# Patient Record
Sex: Male | Born: 1935 | Race: White | Hispanic: No | Smoking: Former smoker
Health system: Southern US, Community
[De-identification: ages and names within clinical notes are randomized; demographics above are authoritative.]

## PROBLEM LIST (undated history)

## (undated) DIAGNOSIS — R42 Dizziness and giddiness: Secondary | ICD-10-CM

## (undated) DIAGNOSIS — Z9621 Cochlear implant status: Secondary | ICD-10-CM

## (undated) DIAGNOSIS — H3530 Unspecified macular degeneration: Secondary | ICD-10-CM

## (undated) DIAGNOSIS — D333 Benign neoplasm of cranial nerves: Secondary | ICD-10-CM

## (undated) DIAGNOSIS — M1990 Unspecified osteoarthritis, unspecified site: Secondary | ICD-10-CM

## (undated) DIAGNOSIS — Z860100 Personal history of colon polyps, unspecified: Secondary | ICD-10-CM

## (undated) DIAGNOSIS — T8859XA Other complications of anesthesia, initial encounter: Secondary | ICD-10-CM

## (undated) DIAGNOSIS — I00 Rheumatic fever without heart involvement: Secondary | ICD-10-CM

## (undated) DIAGNOSIS — A389 Scarlet fever, uncomplicated: Secondary | ICD-10-CM

## (undated) DIAGNOSIS — T4145XA Adverse effect of unspecified anesthetic, initial encounter: Secondary | ICD-10-CM

## (undated) DIAGNOSIS — E7800 Pure hypercholesterolemia, unspecified: Secondary | ICD-10-CM

## (undated) DIAGNOSIS — L409 Psoriasis, unspecified: Secondary | ICD-10-CM

## (undated) DIAGNOSIS — L719 Rosacea, unspecified: Secondary | ICD-10-CM

## (undated) DIAGNOSIS — Z9889 Other specified postprocedural states: Secondary | ICD-10-CM

## (undated) DIAGNOSIS — Z972 Presence of dental prosthetic device (complete) (partial): Secondary | ICD-10-CM

## (undated) HISTORY — DX: Psoriasis, unspecified: L40.9

## (undated) HISTORY — DX: Rheumatic fever without heart involvement: I00

## (undated) HISTORY — PX: COCHLEAR IMPLANT: SUR684

## (undated) HISTORY — PX: BUNIONECTOMY: SHX129

## (undated) HISTORY — DX: Personal history of colonic polyps: Z86.010

## (undated) HISTORY — PX: OTHER SURGICAL HISTORY: SHX169

## (undated) HISTORY — DX: Unspecified macular degeneration: H35.30

## (undated) HISTORY — DX: Personal history of colon polyps, unspecified: Z86.0100

## (undated) HISTORY — DX: Benign neoplasm of cranial nerves: D33.3

## (undated) HISTORY — DX: Rosacea, unspecified: L71.9

## (undated) HISTORY — DX: Pure hypercholesterolemia, unspecified: E78.00

## (undated) HISTORY — DX: Unspecified osteoarthritis, unspecified site: M19.90

## (undated) HISTORY — DX: Other specified postprocedural states: Z98.890

## (undated) HISTORY — DX: Scarlet fever, uncomplicated: A38.9

## (undated) HISTORY — PX: BRAIN SURGERY: SHX531

---
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
## 1957-10-05 HISTORY — PX: APPENDECTOMY: SHX54

## 1992-10-05 HISTORY — PX: CRANIECTOMY FOR EXCISION OF ACOUSTIC NEUROMA: SUR324

## 2002-11-30 ENCOUNTER — Inpatient Hospital Stay (HOSPITAL_COMMUNITY): Admission: EM | Admit: 2002-11-30 | Discharge: 2002-12-02 | Payer: Self-pay | Admitting: *Deleted

## 2002-12-01 ENCOUNTER — Encounter: Payer: Self-pay | Admitting: Internal Medicine

## 2002-12-22 ENCOUNTER — Encounter: Admission: RE | Admit: 2002-12-22 | Discharge: 2002-12-22 | Payer: Self-pay | Admitting: Internal Medicine

## 2002-12-30 ENCOUNTER — Emergency Department (HOSPITAL_COMMUNITY): Admission: EM | Admit: 2002-12-30 | Discharge: 2002-12-30 | Payer: Self-pay | Admitting: Emergency Medicine

## 2003-02-20 ENCOUNTER — Encounter: Admission: RE | Admit: 2003-02-20 | Discharge: 2003-02-20 | Payer: Self-pay | Admitting: Internal Medicine

## 2005-06-23 ENCOUNTER — Ambulatory Visit: Payer: Self-pay | Admitting: Gastroenterology

## 2007-05-31 ENCOUNTER — Ambulatory Visit: Payer: Self-pay

## 2009-05-28 ENCOUNTER — Ambulatory Visit: Payer: Self-pay | Admitting: Internal Medicine

## 2011-07-06 ENCOUNTER — Ambulatory Visit: Payer: Self-pay | Admitting: Internal Medicine

## 2011-10-28 LAB — HM COLONOSCOPY

## 2012-02-09 ENCOUNTER — Ambulatory Visit: Payer: Self-pay | Admitting: Internal Medicine

## 2012-02-19 LAB — LIPID PANEL
Cholesterol: 210 mg/dL — AB (ref 0–200)
LDL Cholesterol: 137 mg/dL
Triglycerides: 188 mg/dL — AB (ref 40–160)

## 2012-02-19 LAB — CBC AND DIFFERENTIAL
HCT: 47 % (ref 41–53)
Hemoglobin: 15.7 g/dL (ref 13.5–17.5)
Platelets: 154 10*3/uL (ref 150–399)
WBC: 4.8 10^3/mL

## 2012-08-15 ENCOUNTER — Ambulatory Visit (INDEPENDENT_AMBULATORY_CARE_PROVIDER_SITE_OTHER): Payer: 59 | Admitting: Internal Medicine

## 2012-08-15 DIAGNOSIS — Z23 Encounter for immunization: Secondary | ICD-10-CM

## 2012-10-26 ENCOUNTER — Encounter: Payer: Self-pay | Admitting: *Deleted

## 2012-10-27 ENCOUNTER — Encounter: Payer: Self-pay | Admitting: Internal Medicine

## 2012-10-27 ENCOUNTER — Ambulatory Visit (INDEPENDENT_AMBULATORY_CARE_PROVIDER_SITE_OTHER): Payer: 59 | Admitting: Internal Medicine

## 2012-10-27 VITALS — BP 110/78 | HR 71 | Temp 98.0°F | Ht 68.0 in | Wt 190.0 lb

## 2012-10-27 DIAGNOSIS — R5383 Other fatigue: Secondary | ICD-10-CM

## 2012-10-27 DIAGNOSIS — E7800 Pure hypercholesterolemia, unspecified: Secondary | ICD-10-CM

## 2012-10-27 DIAGNOSIS — R5381 Other malaise: Secondary | ICD-10-CM

## 2012-10-27 DIAGNOSIS — Z125 Encounter for screening for malignant neoplasm of prostate: Secondary | ICD-10-CM

## 2012-10-27 DIAGNOSIS — H9190 Unspecified hearing loss, unspecified ear: Secondary | ICD-10-CM

## 2012-10-27 DIAGNOSIS — D320 Benign neoplasm of cerebral meninges: Secondary | ICD-10-CM

## 2012-10-27 DIAGNOSIS — D329 Benign neoplasm of meninges, unspecified: Secondary | ICD-10-CM

## 2012-10-27 LAB — CBC WITH DIFFERENTIAL/PLATELET
Basophils Absolute: 0 10*3/uL (ref 0.0–0.1)
Basophils Relative: 0.4 % (ref 0.0–3.0)
Eosinophils Absolute: 0.1 10*3/uL (ref 0.0–0.7)
Eosinophils Relative: 2.4 % (ref 0.0–5.0)
HCT: 48.9 % (ref 39.0–52.0)
Hemoglobin: 16.5 g/dL (ref 13.0–17.0)
Lymphocytes Relative: 24.8 % (ref 12.0–46.0)
Lymphs Abs: 1.3 10*3/uL (ref 0.7–4.0)
MCHC: 33.8 g/dL (ref 30.0–36.0)
MCV: 90.4 fl (ref 78.0–100.0)
Monocytes Absolute: 0.4 10*3/uL (ref 0.1–1.0)
Monocytes Relative: 7.1 % (ref 3.0–12.0)
Neutro Abs: 3.4 10*3/uL (ref 1.4–7.7)
Neutrophils Relative %: 65.3 % (ref 43.0–77.0)
Platelets: 157 10*3/uL (ref 150.0–400.0)
RBC: 5.41 Mil/uL (ref 4.22–5.81)
WBC: 5.2 10*3/uL (ref 4.5–10.5)

## 2012-10-27 LAB — LDL CHOLESTEROL, DIRECT: Direct LDL: 157.2 mg/dL

## 2012-10-27 LAB — COMPREHENSIVE METABOLIC PANEL
ALT: 33 U/L (ref 0–53)
AST: 25 U/L (ref 0–37)
Albumin: 4.2 g/dL (ref 3.5–5.2)
CO2: 28 mEq/L (ref 19–32)
Calcium: 9.5 mg/dL (ref 8.4–10.5)
Chloride: 105 mEq/L (ref 96–112)
GFR: 87.37 mL/min (ref >60.00)
Potassium: 4.3 mEq/L (ref 3.5–5.1)

## 2012-10-27 LAB — LIPID PANEL: Total CHOL/HDL Ratio: 7

## 2012-10-27 LAB — PSA, MEDICARE: PSA: 0.4 ng/ml (ref 0.10–4.00)

## 2012-10-28 ENCOUNTER — Telehealth: Payer: Self-pay | Admitting: Internal Medicine

## 2012-10-28 ENCOUNTER — Ambulatory Visit: Payer: 59

## 2012-10-28 LAB — BILIRUBIN, DIRECT: Bilirubin, Direct: 0.1 mg/dL (ref 0.0–0.3)

## 2012-10-28 NOTE — Telephone Encounter (Signed)
Can you add direct bili to blood drawn yesterday.  Dx hyperbilirubinemia

## 2012-10-30 ENCOUNTER — Encounter: Payer: Self-pay | Admitting: Internal Medicine

## 2012-10-30 DIAGNOSIS — Z86018 Personal history of other benign neoplasm: Secondary | ICD-10-CM | POA: Insufficient documentation

## 2012-10-30 DIAGNOSIS — H9190 Unspecified hearing loss, unspecified ear: Secondary | ICD-10-CM | POA: Insufficient documentation

## 2012-10-30 DIAGNOSIS — E7800 Pure hypercholesterolemia, unspecified: Secondary | ICD-10-CM | POA: Insufficient documentation

## 2012-10-30 NOTE — Progress Notes (Signed)
  Subjective:    [name], male    DOB: Oct 25, 1934, 78 y.o.   MRN: [id number]  HPI 78 year old male with past history of hypercholesterolemia, cochlear implant (after suffering acute hearing loss) and is s/p removal of a meningioma.  He comes in today to follow up on these issues as well as for a complete physical exam.  He states he is doing well.  No chest pain or tightness.  Breathing stable.  Eating and drinking well.  Working part time at [employer].  Bowels stable.  Urinating without difficulty.    Past Medical History  Diagnosis Date  . Hypercholesterolemia   . History of colonic polyps   . Acoustic neuroma   . Rosacea   . Rheumatic fever age 35  . Scarlet fever age 52  . History of craniotomy   . Psoriasis   . Macular degeneration     Current Outpatient Prescriptions on File Prior to Visit  Medication Sig Dispense Refill  . aspirin EC 81 MG tablet Take 81 mg by mouth daily.      . fluticasone (FLONASE) 50 MCG/ACT nasal spray Place 2 sprays into the nose daily.      . Multiple Vitamins-Minerals (VISION-VITE PRESERVE PO) Take by mouth.        Review of Systems Patient denies any headache, lightheadedness or dizziness. No sinus or allergy symptoms.  No chest pain, tightness or palpitations.  No increased shortness of breath, cough or congestion.  No nausea or vomiting.  No abdominal pain or cramping.  No bowel change, such as diarrhea, constipation, BRBPR or melana.  No urine change.   Handling stress.       Objective:   Physical Exam Filed Vitals:   10/27/12 1029  BP: 110/78  Pulse: 71  Temp: 98 F (46.64 C)   78 year old male in no acute distress.  HEENT:  Nares - clear.  Oropharynx - without lesions. NECK:  Supple.  Nontender.  No audible carotid bruit.  HEART:  Appears to be regular.   LUNGS:  No crackles or wheezing audible.  Respirations even and unlabored.   RADIAL PULSE:  Equal bilaterally.  ABDOMEN:  Soft.  Nontender.  Bowel sounds  present and normal.  No audible abdominal bruit.  GU:  Normal descended testicles.  No palpable testicular nodules.   RECTAL:  Could not appreciate any palpable prostate nodules.  Heme negative.   EXTREMITIES:  No increased edema present.  DP pulses palpable and equal bilaterally.          Assessment & Plan:  CARDIOVASCULAR.  ECHO 12/06 - mild to moderate mitral regurgitation, tricuspid regurgitation, mild left atrial enlargement, EF 55-60%.  Has declined follow up ECHO.  Asymptomatic.   GI.  Colonoscopy 07/23/11 revealed diverticulosis, one 6mm polyp in the transverse colon and seven 2-4 mm polyps in the sigmoid colon and external hemorrhoids.  Recommend follow up colonoscopy 3-5 years.    HEALTH MAINTENANCE.  Physical today.  Check psa.  GI as outlined.  Check cholesterol.

## 2012-10-30 NOTE — Assessment & Plan Note (Signed)
Low cholesterol diet and exercise.  Check lipid profile.   

## 2012-10-30 NOTE — Assessment & Plan Note (Signed)
S/p removal.  Continues to follow up with neurosurgery.   

## 2012-10-30 NOTE — Assessment & Plan Note (Signed)
S/P cochlear implant.  Follow.    

## 2012-10-31 ENCOUNTER — Other Ambulatory Visit: Payer: Self-pay | Admitting: Internal Medicine

## 2012-10-31 NOTE — Progress Notes (Signed)
Follow up liver panel ordered.

## 2012-11-28 ENCOUNTER — Encounter: Payer: Self-pay | Admitting: Internal Medicine

## 2012-11-28 ENCOUNTER — Ambulatory Visit (INDEPENDENT_AMBULATORY_CARE_PROVIDER_SITE_OTHER): Payer: 59 | Admitting: Internal Medicine

## 2012-11-28 ENCOUNTER — Other Ambulatory Visit (INDEPENDENT_AMBULATORY_CARE_PROVIDER_SITE_OTHER): Payer: 59

## 2012-11-28 ENCOUNTER — Telehealth: Payer: Self-pay | Admitting: Internal Medicine

## 2012-11-28 VITALS — BP 124/80 | HR 74 | Temp 98.8°F | Ht 68.0 in | Wt 181.0 lb

## 2012-11-28 DIAGNOSIS — R17 Unspecified jaundice: Secondary | ICD-10-CM

## 2012-11-28 DIAGNOSIS — E7800 Pure hypercholesterolemia, unspecified: Secondary | ICD-10-CM

## 2012-11-28 LAB — HEPATIC FUNCTION PANEL: Total Bilirubin: 1 mg/dL (ref 0.3–1.2)

## 2012-11-28 MED ORDER — AZITHROMYCIN 250 MG PO TABS: ORAL_TABLET | ORAL | Status: DC

## 2012-11-28 NOTE — Telephone Encounter (Signed)
Pt notified of labs via my chart.  

## 2012-12-01 ENCOUNTER — Encounter: Payer: Self-pay | Admitting: Internal Medicine

## 2012-12-01 NOTE — Progress Notes (Signed)
  Subjective:    Patient ID: BAIN WHICHARD, male    DOB: 12-19-1934, 78 y.o.   MRN: [id number]  HPI 78 year old male with past history of hypercholesterolemia, cochlear implant (after suffering acute hearing loss) and is s/p removal of a meningioma.  He comes in today as a work in with concerns regarding increased cough and congestion.  He states that symptoms started two weeks ago.  Previously had a minimal headache.  No headache now.  No sore throat.  Cough - productive of colored sputum.  No blood.  No sob.  No chest pain or tightness.  Breathing stable.  Eating and drinking well.  No fever.  No vomiting or diarrhea.    Past Medical History  Diagnosis Date  . Hypercholesterolemia   . History of colonic polyps   . Acoustic neuroma   . Rosacea   . Rheumatic fever age 74  . Scarlet fever age 57  . History of craniotomy   . Psoriasis   . Macular degeneration     Current Outpatient Prescriptions on File Prior to Visit  Medication Sig Dispense Refill  . aspirin EC 81 MG tablet Take 81 mg by mouth daily.      . fluticasone (FLONASE) 50 MCG/ACT nasal spray Place 2 sprays into the nose daily.      . Multiple Vitamins-Minerals (VISION-VITE PRESERVE PO) Take by mouth.       No current facility-administered medications on file prior to visit.    Review of Systems Patient denies any headache now.  No lightheadedness or dizziness. No significant sinus pressure.  Some minimal drainage.   No chest pain, tightness or palpitations.  No increased shortness of breath.  Does report the cough and congestion.   No nausea or vomiting.  Eating and drinking well.       Objective:   Physical Exam  Filed Vitals:   11/28/12 1146  BP: 124/80  Pulse: 74  Temp: 98.8 F (37.1 C)   Pulse ox 36%  78 year old male in no acute distress.  HEENT:  Nares - clear.  Oropharynx - without lesions.  No tenderness to palpation over the sinus.  NECK:  Supple.  Nontender.  No audible carotid bruit.  HEART:   Appears to be regular.   LUNGS:  No crackles or wheezing audible.  Respirations even and unlabored.   RADIAL PULSE:  Equal bilaterally.         Assessment & Plan:  URI.  Treat with a Zpak as directed.  Flonase nasal spray and saline nasal flushes as directed.  Robitussin DM as directed.  Rest.  Fluids.  Explained to him if symptoms changed, worsened or did not resolve, he was to be reevaluated.    CARDIOVASCULAR.  ECHO 12/06 - mild to moderate mitral regurgitation, tricuspid regurgitation, mild left atrial enlargement, EF 55-60%.  Has declined follow up ECHO.  Asymptomatic.   GI.  Colonoscopy 07/23/11 revealed diverticulosis, one 6mm polyp in the transverse colon and seven 2-4 mm polyps in the sigmoid colon and external hemorrhoids.  Recommend follow up colonoscopy 3-5 years.    HEALTH MAINTENANCE.  Physical last visit.   GI as outlined.

## 2012-12-01 NOTE — Assessment & Plan Note (Signed)
He informed me he was working on diet and exercise and has lost weight.  Follow cholesterol.

## 2013-03-10 DIAGNOSIS — H3530 Unspecified macular degeneration: Secondary | ICD-10-CM | POA: Insufficient documentation

## 2013-04-27 ENCOUNTER — Ambulatory Visit: Payer: 59 | Admitting: Internal Medicine

## 2013-05-10 ENCOUNTER — Other Ambulatory Visit: Payer: Self-pay

## 2013-08-10 ENCOUNTER — Other Ambulatory Visit: Payer: Self-pay

## 2013-08-25 ENCOUNTER — Ambulatory Visit (INDEPENDENT_AMBULATORY_CARE_PROVIDER_SITE_OTHER): Payer: Medicare Other

## 2013-08-25 DIAGNOSIS — Z23 Encounter for immunization: Secondary | ICD-10-CM

## 2013-10-23 ENCOUNTER — Encounter: Payer: Self-pay | Admitting: Internal Medicine

## 2013-10-24 ENCOUNTER — Ambulatory Visit (INDEPENDENT_AMBULATORY_CARE_PROVIDER_SITE_OTHER): Payer: 59 | Admitting: Internal Medicine

## 2013-10-24 ENCOUNTER — Encounter: Payer: Self-pay | Admitting: Internal Medicine

## 2013-10-24 VITALS — BP 106/66 | HR 90 | Temp 98.4°F | Resp 12 | Ht 68.0 in | Wt 189.5 lb

## 2013-10-24 DIAGNOSIS — R109 Unspecified abdominal pain: Secondary | ICD-10-CM

## 2013-10-24 DIAGNOSIS — R55 Syncope and collapse: Secondary | ICD-10-CM

## 2013-10-24 LAB — HEPATIC FUNCTION PANEL
ALK PHOS: 92 U/L (ref 39–117)
ALT: 32 U/L (ref 0–53)
AST: 25 U/L (ref 0–37)
Albumin: 4 g/dL (ref 3.5–5.2)
BILIRUBIN TOTAL: 1.9 mg/dL — AB (ref 0.3–1.2)
Bilirubin, Direct: 0.3 mg/dL (ref 0.0–0.3)
Total Protein: 7 g/dL (ref 6.0–8.3)

## 2013-10-24 LAB — CBC WITH DIFFERENTIAL/PLATELET
BASOS ABS: 0 10*3/uL (ref 0.0–0.1)
Basophils Relative: 0.2 % (ref 0.0–3.0)
Eosinophils Absolute: 0.2 10*3/uL (ref 0.0–0.7)
Eosinophils Relative: 1.8 % (ref 0.0–5.0)
HCT: 49.4 % (ref 39.0–52.0)
HEMOGLOBIN: 16.7 g/dL (ref 13.0–17.0)
LYMPHS PCT: 11 % — AB (ref 12.0–46.0)
Lymphs Abs: 1.1 10*3/uL (ref 0.7–4.0)
MCHC: 33.9 g/dL (ref 30.0–36.0)
MCV: 90.7 fl (ref 78.0–100.0)
Monocytes Absolute: 0.6 10*3/uL (ref 0.1–1.0)
Monocytes Relative: 6.7 % (ref 3.0–12.0)
NEUTROS ABS: 7.7 10*3/uL (ref 1.4–7.7)
Neutrophils Relative %: 80.3 % — ABNORMAL HIGH (ref 43.0–77.0)
PLATELETS: 151 10*3/uL (ref 150.0–400.0)
RBC: 5.45 Mil/uL (ref 4.22–5.81)
RDW: 13.6 % (ref 11.5–14.6)
WBC: 9.6 10*3/uL (ref 4.5–10.5)

## 2013-10-24 LAB — URINALYSIS, ROUTINE W REFLEX MICROSCOPIC
Bilirubin Urine: NEGATIVE
HGB URINE DIPSTICK: NEGATIVE
KETONES UR: NEGATIVE
Leukocytes, UA: NEGATIVE
Nitrite: NEGATIVE
Specific Gravity, Urine: >=1.03 — AB (ref 1.000–1.030)
Total Protein, Urine: NEGATIVE
URINE GLUCOSE: NEGATIVE
Urobilinogen, UA: 0.2 (ref 0.0–1.0)
pH: 5.5 (ref 5.0–8.0)

## 2013-10-24 LAB — BASIC METABOLIC PANEL
BUN: 13 mg/dL (ref 6–23)
CALCIUM: 9.2 mg/dL (ref 8.4–10.5)
CO2: 28 meq/L (ref 19–32)
CREATININE: 1.1 mg/dL (ref 0.4–1.5)
Chloride: 101 mEq/L (ref 96–112)
GFR: 72.14 mL/min (ref >60.00)
GLUCOSE: 107 mg/dL — AB (ref 70–99)
Potassium: 4.4 mEq/L (ref 3.5–5.1)
Sodium: 138 mEq/L (ref 135–145)

## 2013-10-24 LAB — LIPASE: LIPASE: 70 U/L — AB (ref 11.0–59.0)

## 2013-10-24 LAB — AMYLASE: Amylase: 71 U/L (ref 27–131)

## 2013-10-24 MED ORDER — AZITHROMYCIN 250 MG PO TABS: ORAL_TABLET | ORAL | Status: DC

## 2013-10-24 MED ORDER — FLUTICASONE PROPIONATE 50 MCG/ACT NA SUSP: 2.0000 | Freq: Every day | NASAL | Status: DC

## 2013-10-24 NOTE — Progress Notes (Signed)
Pre visit review using our clinic review tool, if applicable. No additional management support is needed unless otherwise documented below in the visit note. 

## 2013-10-24 NOTE — Patient Instructions (Signed)
Saline nasal spray - flush nose at least 2-3x/day.  Flonase nasal spray - 2 sprays each nostril one time per day- do this in the evening.

## 2013-10-25 ENCOUNTER — Ambulatory Visit: Payer: Self-pay | Admitting: Internal Medicine

## 2013-10-26 NOTE — Telephone Encounter (Signed)
Mailed unread message to pt  

## 2013-10-28 ENCOUNTER — Encounter: Payer: Self-pay | Admitting: Internal Medicine

## 2013-10-28 NOTE — Progress Notes (Signed)
Subjective:    [name], male    DOB: April 12, 1935, 79 y.o.   MRN: [id number]  Abdominal Pain  79 year old male with past history of hypercholesterolemia, cochlear implant (after suffering acute hearing loss) and is s/p removal of a meningioma.  He comes in today as a work in with concerns regarding increased abdominal pain.  States that three weeks ago had some increased sinus pressure and drainage.  Reports still having increased cough and chest congestion.  No sore throat.  Green mucus production.  Felt dizzy last week.  Took meclizine.  This has resolved.  Reports some decreased appetite.  Staying hydrated.  States that two days ago, noticed some heartburn.  Took pepcid AC.  He then noticed pain under his ribs.  Felt bloated.  Hurt to touch.  Pain went to his back.  The bad episode lasted approximately 6 hours.  Some nausea.  No vomiting.  Described the pain as a burning pain.  Could taste what he had eaten.  No urine change.     Past Medical History  Diagnosis Date  . Hypercholesterolemia   . History of colonic polyps   . Acoustic neuroma   . Rosacea   . Rheumatic fever age 61  . Scarlet fever age 61  . History of craniotomy   . Psoriasis   . Macular degeneration     Current Outpatient Prescriptions on File Prior to Visit  Medication Sig Dispense Refill  . aspirin EC 81 MG tablet Take 81 mg by mouth daily.      . Multiple Vitamins-Minerals (VISION-VITE PRESERVE PO) Take by mouth.       No current facility-administered medications on file prior to visit.    Review of Systems  Gastrointestinal: Positive for abdominal pain.  Patient denies any headache now.  Minimal headache yesterday.  No lightheadedness or dizziness. No significant sinus pressure now.   Some drainage.   No chest pain, tightness or palpitations.  No increased shortness of breath.  Does report the cough and chest congestion.   Green mucus production.  Decreased appetite.  Some nausea.  No vomiting.   Increased pain under ribs and radiating to back.  Some burning pain.  See above.       Objective:   Physical Exam  Filed Vitals:   10/24/13 1149  BP: 106/66  Pulse: 90  Temp: 98.4 F (36.9 C)  Resp: 12   Blood pressure recheck:  122/78 lying and 122/80 standing.   79 year old male in no acute distress.  HEENT:  Nares - clear.  Oropharynx - without lesions.  No tenderness to palpation over the sinus.  NECK:  Supple.  Nontender.   HEART:  Appears to be regular.   LUNGS:  No crackles or wheezing audible.  Respirations even and unlabored.   RADIAL PULSE:  Equal bilaterally.   ABDOMEN:  Minimal tenderness to palpation over the epigastric region.  No other abdominal pain to palpation.  Bowel sounds present and normal.   BACK:  Non tender.  No CVA tenderness.         Assessment & Plan:  URI.  Treat with a Zpak as directed.  Flonase nasal spray and saline nasal flushes as directed.  Robitussin DM as directed.  Rest.  Fluids.  Explained to him if symptoms changed, worsened or did not resolve, he was to be reevaluated.    CARDIOVASCULAR.  ECHO 12/06 - mild to moderate mitral regurgitation, tricuspid regurgitation, mild left  atrial enlargement, EF 55-60%.  He has declined follow up ECHO.   EKG today revealed sinus rhythm with no acute ischemic changes.    GI.  Colonoscopy 07/23/11 revealed diverticulosis, one 48mm polyp in the transverse colon and seven 2-4 mm polyps in the sigmoid colon and external hemorrhoids.  Recommend follow up colonoscopy 3-5 years.    HEALTH MAINTENANCE.  Physical 10/27/12.   GI as outlined.   I spent more than 25 minutes with the patient and more than 50% of the time was spent in consultation regarding the above.

## 2013-10-29 DIAGNOSIS — R109 Unspecified abdominal pain: Secondary | ICD-10-CM | POA: Insufficient documentation

## 2013-10-29 NOTE — Assessment & Plan Note (Signed)
Symptoms and exam as outlined.  Check cbc, met b, liver panel and amylase/lipase.  Check abdominal ultrasound.  Take zantac daily.  Follow closely.  Explained to him if symptoms changed, worsened or did not resolve, he was to be reevaluated.

## 2013-10-30 ENCOUNTER — Encounter: Payer: Self-pay | Admitting: *Deleted

## 2013-10-31 ENCOUNTER — Encounter: Payer: Self-pay | Admitting: Internal Medicine

## 2013-10-31 ENCOUNTER — Ambulatory Visit (INDEPENDENT_AMBULATORY_CARE_PROVIDER_SITE_OTHER): Payer: 59 | Admitting: Internal Medicine

## 2013-10-31 VITALS — BP 110/70 | HR 78 | Temp 97.9°F | Ht 68.0 in | Wt 187.5 lb

## 2013-10-31 DIAGNOSIS — R748 Abnormal levels of other serum enzymes: Secondary | ICD-10-CM

## 2013-10-31 DIAGNOSIS — R109 Unspecified abdominal pain: Secondary | ICD-10-CM

## 2013-10-31 LAB — LIPASE: LIPASE: 69 U/L — AB (ref 11.0–59.0)

## 2013-10-31 LAB — AMYLASE: Amylase: 86 U/L (ref 27–131)

## 2013-10-31 NOTE — Progress Notes (Signed)
Pre-visit discussion using our clinic review tool. No additional management support is needed unless otherwise documented below in the visit note.  

## 2013-10-31 NOTE — Progress Notes (Signed)
Subjective:    [name], male    DOB: May 21, 1935, 79 y.o.   MRN: [id number]  Abdominal Pain  79 year old male with past history of hypercholesterolemia, cochlear implant (after suffering acute hearing loss) and is s/p removal of a meningioma.  He comes in today for a scheduled follow up.  Was seen last week for abdominal pain.  See that note for details.  Had labs and abdominal ultrasound.  Ultrasound revealed no gallstones, but possible sludge in the gallblader.  No other acute abnormality.  He comes in today stating he feels better.  He is accompanied by his daughter.  History obtained from both of them.  The previous abdominal pain is better.  He does report some lower abdominal pressure.  Some nausea.  No diarrhea.  Some constipation with decreased bowel movements.  Has not been eating regular meals.  Previous decreased appetite.  Appetite has improved recently.  Some acid occasionally.  Still with some lower abdominal discomfort.  No other abdominal pain.  Has noticed some BRB on tissue with wiping.  Previous light headed sensation resolved.  Congestion resolved.      Past Medical History  Diagnosis Date  . Hypercholesterolemia   . History of colonic polyps   . Acoustic neuroma   . Rosacea   . Rheumatic fever age 57  . Scarlet fever age 42  . History of craniotomy   . Psoriasis   . Macular degeneration     Current Outpatient Prescriptions on File Prior to Visit  Medication Sig Dispense Refill  . aspirin EC 81 MG tablet Take 81 mg by mouth daily.      . fluticasone (FLONASE) 50 MCG/ACT nasal spray Place 2 sprays into both nostrils daily.  16 g  1  . Multiple Vitamins-Minerals (VISION-VITE PRESERVE PO) Take by mouth.       No current facility-administered medications on file prior to visit.    Review of Systems  Gastrointestinal: Positive for abdominal pain.  Patient denies any headache.   No lightheadedness or dizziness. No significant sinus pressure now.  No  chest pain, tightness or palpitations.  No increased shortness of breath.  No cough or chest congestion.   Appetite has improved some.   Some nausea.  No vomiting.  The previous abdominal pain has resolved.  Some lower abdominal pressure.  Bowel change as outlined.  Abdominal ultrasound as outlined.        Objective:   Physical Exam  Filed Vitals:   10/31/13 0856  BP: 110/70  Pulse: 78  Temp: 97.9 F (36.6 C)   Blood pressure recheck:  75/40   79 year old male in no acute distress.  HEENT:  Nares - clear.  Oropharynx - without lesions.  No tenderness to palpation over the sinus.  NECK:  Supple.  Nontender.   HEART:  Appears to be regular.   LUNGS:  No crackles or wheezing audible.  Respirations even and unlabored.   RADIAL PULSE:  Equal bilaterally.   ABDOMEN:  No pain to palpation over the epigastric region.  No other significant abdominal pain to palpation.  Minimal lower abdominal discomfort.  Bowel sounds present and normal.   BACK:  Non tender.  No CVA tenderness.         Assessment & Plan:  URI.  Resolved.     CARDIOVASCULAR.  ECHO 12/06 - mild to moderate mitral regurgitation, tricuspid regurgitation, mild left atrial enlargement, EF 55-60%.  He has declined follow up  ECHO.   EKG today revealed sinus rhythm with no acute ischemic changes.    GI.  Colonoscopy 07/23/11 revealed diverticulosis, one 29mm polyp in the transverse colon and seven 2-4 mm polyps in the sigmoid colon and external hemorrhoids.  Recommend follow up colonoscopy 3-5 years.  Discussed with him today.  Discussed referral back to GI for evaluation especially given bowel change and BRB.  He declines at this time.  Wants to pursue HIDA first.  Will notify me when he changes his mind.    HEALTH MAINTENANCE.  Physical 10/27/12.   GI as outlined.   I spent more than 25 minutes with the patient and more than 50% of the time was spent in consultation regarding the above.

## 2013-11-01 ENCOUNTER — Encounter: Payer: Self-pay | Admitting: Internal Medicine

## 2013-11-02 ENCOUNTER — Encounter: Payer: Self-pay | Admitting: Internal Medicine

## 2013-11-02 NOTE — Assessment & Plan Note (Signed)
Previous abdominal pain and resolved.  Now with lower abdominal discomfort.  Abdominal ultrasound results as outlined.  Recent labs normal except for slightly elevated lipase.  Will recheck today.  Discussed ultrasound results.  Will obtain HIDA scan.  He declines GI referral at this time.  Increase fluid intake.  miralax as outlined.  Feel the bowel change may be related to his change and decreased appetite/diet.  Will follow.  Further w/up pending results of HIDA scan.

## 2013-11-08 ENCOUNTER — Ambulatory Visit: Payer: Self-pay | Admitting: Internal Medicine

## 2013-11-10 ENCOUNTER — Telehealth: Payer: Self-pay | Admitting: Internal Medicine

## 2013-11-10 NOTE — Telephone Encounter (Signed)
Sent my chart message to pt with HIDA scan results.  He is to update me regarding his symptoms.

## 2013-11-15 ENCOUNTER — Encounter: Payer: Self-pay | Admitting: Internal Medicine

## 2013-12-07 ENCOUNTER — Encounter: Payer: Self-pay | Admitting: Internal Medicine

## 2013-12-07 ENCOUNTER — Ambulatory Visit (INDEPENDENT_AMBULATORY_CARE_PROVIDER_SITE_OTHER): Payer: Medicare Other | Admitting: Internal Medicine

## 2013-12-07 DIAGNOSIS — E7800 Pure hypercholesterolemia, unspecified: Secondary | ICD-10-CM

## 2013-12-07 DIAGNOSIS — D320 Benign neoplasm of cerebral meninges: Secondary | ICD-10-CM

## 2013-12-07 DIAGNOSIS — R195 Other fecal abnormalities: Secondary | ICD-10-CM

## 2013-12-07 DIAGNOSIS — D329 Benign neoplasm of meninges, unspecified: Secondary | ICD-10-CM

## 2013-12-07 DIAGNOSIS — Z1211 Encounter for screening for malignant neoplasm of colon: Secondary | ICD-10-CM

## 2013-12-07 DIAGNOSIS — Z125 Encounter for screening for malignant neoplasm of prostate: Secondary | ICD-10-CM

## 2013-12-07 DIAGNOSIS — R17 Unspecified jaundice: Secondary | ICD-10-CM

## 2013-12-07 DIAGNOSIS — Z23 Encounter for immunization: Secondary | ICD-10-CM

## 2013-12-07 DIAGNOSIS — L989 Disorder of the skin and subcutaneous tissue, unspecified: Secondary | ICD-10-CM

## 2013-12-07 DIAGNOSIS — R109 Unspecified abdominal pain: Secondary | ICD-10-CM

## 2013-12-07 DIAGNOSIS — H9190 Unspecified hearing loss, unspecified ear: Secondary | ICD-10-CM

## 2013-12-07 NOTE — Progress Notes (Signed)
Pre-visit discussion using our clinic review tool. No additional management support is needed unless otherwise documented below in the visit note.  

## 2013-12-10 ENCOUNTER — Encounter: Payer: Self-pay | Admitting: Internal Medicine

## 2013-12-10 NOTE — Assessment & Plan Note (Signed)
Low cholesterol diet and exercise.  Follow cholesterol.   

## 2013-12-10 NOTE — Assessment & Plan Note (Signed)
S/P cochlear implant.  Follow.

## 2013-12-10 NOTE — Addendum Note (Signed)
Addended by: Alisa Graff on: 12/10/2013 06:48 PM   Modules accepted: Orders

## 2013-12-10 NOTE — Assessment & Plan Note (Signed)
S/p removal.  Continues to follow up with neurosurgery.   

## 2013-12-10 NOTE — Assessment & Plan Note (Signed)
Previous abdominal pain resolved.  Abdominal ultrasound results as outlined in last note.  HIDA negative.  No pain now.  Heme positive on exam today.  Refer to GI for evaluation and colonoscopy.

## 2013-12-10 NOTE — Progress Notes (Signed)
  Subjective:    [name], male    DOB: 1935/05/11, 79 y.o.   MRN: [id number]  HPI 79 year old male with past history of hypercholesterolemia, cochlear implant (after suffering acute hearing loss) and is s/p removal of a meningioma.  He comes in today to follow up on these issues as well as for a complete physical exam.  He states he feels good.  No abdominal pain or cramping.  No nausea or vomiting.  No acid reflux.  No urine or bowel change. Eating and drinking well.      Past Medical History  Diagnosis Date  . Hypercholesterolemia   . History of colonic polyps   . Acoustic neuroma   . Rosacea   . Rheumatic fever age 68  . Scarlet fever age 23  . History of craniotomy   . Psoriasis   . Macular degeneration     Current Outpatient Prescriptions on File Prior to Visit  Medication Sig Dispense Refill  . aspirin EC 81 MG tablet Take 81 mg by mouth daily.      . fluticasone (FLONASE) 50 MCG/ACT nasal spray Place 2 sprays into both nostrils daily.  16 g  1  . Multiple Vitamins-Minerals (VISION-VITE PRESERVE PO) Take by mouth.       No current facility-administered medications on file prior to visit.    Review of Systems Patient denies any headache.   No lightheadedness or dizziness. No significant sinus pressure.   No chest pain, tightness or palpitations.  No increased shortness of breath.  No cough or congestion.  No nausea or vomiting.  No acid reflux.  Eating and drinking well.  No abdominal pain or cramping.  No bowel change.  Feels better.  No further flares.       Objective:   Physical Exam  Filed Vitals:   12/07/13 1546  BP: 130/80  Pulse: 69  Temp: 98.1 F (36.7 C)   Blood pressure recheck:  44/35  79 year old male in no acute distress.  HEENT:  Nares - clear.  Oropharynx - without lesions. NECK:  Supple.  Nontender.  No audible carotid bruit.  HEART:  Appears to be regular.   LUNGS:  No crackles or wheezing audible.  Respirations even and  unlabored.   RADIAL PULSE:  Equal bilaterally.  ABDOMEN:  Soft.  Nontender.  Bowel sounds present and normal.  No audible abdominal bruit.  GU:  Normal descended testicles.  No palpable testicular nodules.   RECTAL:  Could not appreciate any palpable prostate nodules.  Heme positive.    EXTREMITIES:  No increased edema present.  DP pulses palpable and equal bilaterally.        Assessment & Plan:  CARDIOVASCULAR.  ECHO 12/06 - mild to moderate mitral regurgitation, tricuspid regurgitation, mild left atrial enlargement, EF 55-60%.  Has declined follow up ECHO.  Asymptomatic.   GI.  Colonoscopy 07/23/11 revealed diverticulosis, one 23mm polyp in the transverse colon and seven 2-4 mm polyps in the sigmoid colon and external hemorrhoids.  Recommend follow up colonoscopy 3-5 years.  Recent abdominal pain.  Resolved.  See previous notes for details.  Abdominal US and HIDA negative.  Heme positive on exam today.  Refer to GI for colonoscopy and further evaluation.    HEALTH MAINTENANCE.  Physical today.   GI as outlined.  Check psa.

## 2013-12-14 ENCOUNTER — Other Ambulatory Visit (INDEPENDENT_AMBULATORY_CARE_PROVIDER_SITE_OTHER): Payer: Medicare Other

## 2013-12-14 DIAGNOSIS — R17 Unspecified jaundice: Secondary | ICD-10-CM

## 2013-12-14 DIAGNOSIS — Z125 Encounter for screening for malignant neoplasm of prostate: Secondary | ICD-10-CM

## 2013-12-14 DIAGNOSIS — E7800 Pure hypercholesterolemia, unspecified: Secondary | ICD-10-CM

## 2013-12-14 LAB — HEPATIC FUNCTION PANEL
ALT: 35 U/L (ref 0–53)
AST: 26 U/L (ref 0–37)
Albumin: 4 g/dL (ref 3.5–5.2)
Alkaline Phosphatase: 80 U/L (ref 39–117)
Bilirubin, Direct: 0.2 mg/dL (ref 0.0–0.3)
Total Bilirubin: 1.2 mg/dL (ref 0.3–1.2)
Total Protein: 6.3 g/dL (ref 6.0–8.3)

## 2013-12-14 LAB — LIPID PANEL
CHOLESTEROL: 227 mg/dL — AB (ref 0–200)
HDL: 36.6 mg/dL — AB (ref >39.00)
LDL Cholesterol: 153 mg/dL — ABNORMAL HIGH (ref 0–99)
Total CHOL/HDL Ratio: 6
Triglycerides: 188 mg/dL — ABNORMAL HIGH (ref 0.0–149.0)
VLDL: 37.6 mg/dL (ref 0.0–40.0)

## 2013-12-14 LAB — PSA, MEDICARE: PSA: 0.3 ng/ml (ref 0.10–4.00)

## 2013-12-15 ENCOUNTER — Encounter: Payer: Self-pay | Admitting: Internal Medicine

## 2013-12-27 ENCOUNTER — Encounter: Payer: Self-pay | Admitting: Internal Medicine

## 2014-03-06 ENCOUNTER — Other Ambulatory Visit: Payer: Self-pay | Admitting: Internal Medicine

## 2014-04-12 ENCOUNTER — Ambulatory Visit (INDEPENDENT_AMBULATORY_CARE_PROVIDER_SITE_OTHER): Payer: Medicare Other | Admitting: Internal Medicine

## 2014-04-12 ENCOUNTER — Encounter: Payer: Self-pay | Admitting: Internal Medicine

## 2014-04-12 VITALS — BP 120/70 | HR 66 | Temp 98.0°F | Ht 67.0 in | Wt 191.0 lb

## 2014-04-12 DIAGNOSIS — R17 Unspecified jaundice: Secondary | ICD-10-CM

## 2014-04-12 DIAGNOSIS — R109 Unspecified abdominal pain: Secondary | ICD-10-CM

## 2014-04-12 DIAGNOSIS — E7800 Pure hypercholesterolemia, unspecified: Secondary | ICD-10-CM

## 2014-04-12 DIAGNOSIS — H9190 Unspecified hearing loss, unspecified ear: Secondary | ICD-10-CM

## 2014-04-12 DIAGNOSIS — D329 Benign neoplasm of meninges, unspecified: Secondary | ICD-10-CM

## 2014-04-12 DIAGNOSIS — D320 Benign neoplasm of cerebral meninges: Secondary | ICD-10-CM

## 2014-04-12 LAB — CBC WITH DIFFERENTIAL/PLATELET
Basophils Absolute: 0 10*3/uL (ref 0.0–0.1)
Basophils Relative: 0.3 % (ref 0.0–3.0)
Eosinophils Absolute: 0.1 10*3/uL (ref 0.0–0.7)
Eosinophils Relative: 3 % (ref 0.0–5.0)
HCT: 47.1 % (ref 39.0–52.0)
Hemoglobin: 16.1 g/dL (ref 13.0–17.0)
Lymphocytes Relative: 27.4 % (ref 12.0–46.0)
Lymphs Abs: 1.2 10*3/uL (ref 0.7–4.0)
MCHC: 34.2 g/dL (ref 30.0–36.0)
MCV: 89.9 fl (ref 78.0–100.0)
MONOS PCT: 7.2 % (ref 3.0–12.0)
Monocytes Absolute: 0.3 10*3/uL (ref 0.1–1.0)
NEUTROS PCT: 62.1 % (ref 43.0–77.0)
Neutro Abs: 2.8 10*3/uL (ref 1.4–7.7)
Platelets: 144 10*3/uL — ABNORMAL LOW (ref 150.0–400.0)
RBC: 5.24 Mil/uL (ref 4.22–5.81)
RDW: 13.8 % (ref 11.5–15.5)
WBC: 4.5 10*3/uL (ref 4.0–10.5)

## 2014-04-12 LAB — BASIC METABOLIC PANEL
BUN: 16 mg/dL (ref 6–23)
CALCIUM: 9.3 mg/dL (ref 8.4–10.5)
CO2: 28 meq/L (ref 19–32)
CREATININE: 0.9 mg/dL (ref 0.4–1.5)
Chloride: 104 mEq/L (ref 96–112)
GFR: 84.85 mL/min (ref >60.00)
Glucose, Bld: 100 mg/dL — ABNORMAL HIGH (ref 70–99)
Potassium: 4.4 mEq/L (ref 3.5–5.1)
Sodium: 138 mEq/L (ref 135–145)

## 2014-04-12 LAB — HEPATIC FUNCTION PANEL
ALT: 33 U/L (ref 0–53)
AST: 26 U/L (ref 0–37)
Albumin: 4.1 g/dL (ref 3.5–5.2)
Alkaline Phosphatase: 96 U/L (ref 39–117)
Bilirubin, Direct: 0.1 mg/dL (ref 0.0–0.3)
Total Bilirubin: 1 mg/dL (ref 0.2–1.2)
Total Protein: 6.7 g/dL (ref 6.0–8.3)

## 2014-04-12 LAB — LIPID PANEL
Cholesterol: 218 mg/dL — ABNORMAL HIGH (ref 0–200)
HDL: 31.2 mg/dL — ABNORMAL LOW (ref >39.00)
LDL Cholesterol: 133 mg/dL — ABNORMAL HIGH (ref 0–99)
NONHDL: 186.8
Total CHOL/HDL Ratio: 7
Triglycerides: 269 mg/dL — ABNORMAL HIGH (ref 0.0–149.0)
VLDL: 53.8 mg/dL — AB (ref 0.0–40.0)

## 2014-04-12 LAB — TSH: TSH: 1.2 u[IU]/mL (ref 0.35–4.50)

## 2014-04-12 NOTE — Progress Notes (Signed)
Pre visit review using our clinic review tool, if applicable. No additional management support is needed unless otherwise documented below in the visit note. 

## 2014-04-16 ENCOUNTER — Encounter: Payer: Self-pay | Admitting: Internal Medicine

## 2014-04-16 NOTE — Assessment & Plan Note (Signed)
S/p removal.  Continues to follow up with neurosurgery.

## 2014-04-16 NOTE — Assessment & Plan Note (Signed)
S/P cochlear implant.  Doing well.  Follow.

## 2014-04-16 NOTE — Assessment & Plan Note (Signed)
Low cholesterol diet and exercise.  Follow cholesterol.   

## 2014-04-16 NOTE — Assessment & Plan Note (Signed)
Previous abdominal pain resolved.  Abdominal ultrasound results as outlined in last note.  HIDA negative.  No pain now.  Heme positive on last exam.  Referred to GI.  Felt no further w/up or evaluation warranted.  Follow.

## 2014-04-16 NOTE — Progress Notes (Signed)
  Subjective:    [name], male    DOB: [birth year], 79 y.o.   MRN: [id number]  HPI 79 year old male with past history of hypercholesterolemia, cochlear implant (after suffering acute hearing loss) and is s/p removal of a meningioma.  He comes in today for a scheduled follow up.   He states he feels good.  No abdominal pain or cramping.  No nausea or vomiting.  No acid reflux.  No urine or bowel change.  Eating and drinking well.  No blood in his stool.  Plays golf.  Tries to stay active.      Past Medical History  Diagnosis Date  . Hypercholesterolemia   . History of colonic polyps   . Acoustic neuroma   . Rosacea   . Rheumatic fever age 37  . Scarlet fever age 5  . History of craniotomy   . Psoriasis   . Macular degeneration     Current Outpatient Prescriptions on File Prior to Visit  Medication Sig Dispense Refill  . aspirin EC 81 MG tablet Take 81 mg by mouth daily.      . fluticasone (FLONASE) 50 MCG/ACT nasal spray USE TWO SPRAYS IN EACH NOSTRIL EACH DAY AS DIRECTED BY PHYSICIAN  16 g  5  . Multiple Vitamins-Minerals (VISION-VITE PRESERVE PO) Take by mouth.       No current facility-administered medications on file prior to visit.    Review of Systems Patient denies any headache.   No lightheadedness or dizziness.  No significant sinus pressure.   No chest pain, tightness or palpitations.  No increased shortness of breath.  No cough or congestion.  No nausea or vomiting.  No acid reflux.  Eating and drinking well.  No abdominal pain or cramping.  No bowel change.  Feels better.  No further flares.       Objective:   Physical Exam  Filed Vitals:   04/12/14 0950  BP: 120/70  Pulse: 66  Temp: 98 F (37.48 C)   79 year old male in no acute distress.  HEENT:  Nares - clear.  Oropharynx - without lesions. NECK:  Supple.  Nontender.  No audible carotid bruit.  HEART:  Appears to be regular.   LUNGS:  No crackles or wheezing audible.  Respirations even and  unlabored.   RADIAL PULSE:  Equal bilaterally.  ABDOMEN:  Soft.  Nontender.  Bowel sounds present and normal.  No audible abdominal bruit.    EXTREMITIES:  No increased edema present.  DP pulses palpable and equal bilaterally.        Assessment & Plan:  CARDIOVASCULAR.  ECHO 12/06 - mild to moderate mitral regurgitation, tricuspid regurgitation, mild left atrial enlargement, EF 55-60%.  Has declined follow up ECHO.  Asymptomatic.   GI.  Colonoscopy 07/23/11 revealed diverticulosis, one 61mm polyp in the transverse colon and seven 2-4 mm polyps in the sigmoid colon and external hemorrhoids.  Recommend follow up colonoscopy 3-5 years.  Recent abdominal pain.  Resolved.  See previous notes for details.  Abdominal US and HIDA negative.  Was referred to GI for heme positive stool.  Evaluated at [facility].  Felt no further w/up warranted.  Follow.      HEALTH MAINTENANCE.  Physical 12/07/13.   GI as outlined.  PSA 12/14/13 - .30.

## 2014-04-19 ENCOUNTER — Encounter: Payer: Self-pay | Admitting: Internal Medicine

## 2014-04-20 ENCOUNTER — Telehealth: Payer: Self-pay | Admitting: Internal Medicine

## 2014-04-20 DIAGNOSIS — D696 Thrombocytopenia, unspecified: Secondary | ICD-10-CM

## 2014-04-20 NOTE — Telephone Encounter (Signed)
lab appt scheduled & [facility] lipid handout mailed

## 2014-04-20 NOTE — Telephone Encounter (Signed)
Pt notified of lab results via my chart.  Needs [facility] diet.  Please send.  Also needs a f/u platelet count within the next 2 weeks.  Please schedule a non fasting lab appt in 2 weeks.  Thanks.

## 2014-05-04 ENCOUNTER — Other Ambulatory Visit: Payer: 59

## 2014-05-08 ENCOUNTER — Other Ambulatory Visit (INDEPENDENT_AMBULATORY_CARE_PROVIDER_SITE_OTHER): Payer: 59

## 2014-05-08 DIAGNOSIS — D696 Thrombocytopenia, unspecified: Secondary | ICD-10-CM

## 2014-05-09 ENCOUNTER — Encounter: Payer: Self-pay | Admitting: Internal Medicine

## 2014-05-09 ENCOUNTER — Telehealth: Payer: Self-pay | Admitting: *Deleted

## 2014-05-09 DIAGNOSIS — D696 Thrombocytopenia, unspecified: Secondary | ICD-10-CM

## 2014-05-09 LAB — PLATELET COUNT: Platelets: 74 10*3/uL — ABNORMAL LOW (ref 150–400)

## 2014-05-09 NOTE — Telephone Encounter (Signed)
Lattie Haw called to inform us that she had Mr. Moronta scheduled for tomorrow @ 10:30, however he has declined an appointment. If pt decides to be seen, he will need to be rescheduled & she will need a copy of his BMP & ultrasound faxed to: [phone]

## 2014-05-09 NOTE — Telephone Encounter (Signed)
I have called pt multiple times today.  Not answering.  I left him a message on his home phone and cell phone to call the office.  If he calls, please explain to him that his platelet count has decreased significantly from the previous check and I do recommend him seeing the hematologist.  If I need to talk with him and explain, find out how I can reach him and when.  Thanks.

## 2014-05-09 NOTE — Telephone Encounter (Signed)
Attempted to call pt.  Not answering.  Will try back.

## 2014-05-10 ENCOUNTER — Other Ambulatory Visit: Payer: Self-pay | Admitting: Internal Medicine

## 2014-05-10 DIAGNOSIS — D696 Thrombocytopenia, unspecified: Secondary | ICD-10-CM

## 2014-05-10 NOTE — Progress Notes (Signed)
Order placed for f/u cbc.   

## 2014-05-10 NOTE — Telephone Encounter (Signed)
My chart message sent to pt and was instructed to repeat platelet count 05/10/14 or 05/11/14.

## 2014-05-10 NOTE — Telephone Encounter (Signed)
Order placed for hematology referral.  

## 2014-05-10 NOTE — Telephone Encounter (Signed)
Pt notified & coming tomorrow to repeat lab

## 2014-05-11 ENCOUNTER — Other Ambulatory Visit (INDEPENDENT_AMBULATORY_CARE_PROVIDER_SITE_OTHER): Payer: 59

## 2014-05-11 ENCOUNTER — Other Ambulatory Visit: Payer: Self-pay | Admitting: Internal Medicine

## 2014-05-11 DIAGNOSIS — D696 Thrombocytopenia, unspecified: Secondary | ICD-10-CM

## 2014-05-11 LAB — CBC WITH DIFFERENTIAL/PLATELET
BASOS ABS: 0 10*3/uL (ref 0.0–0.1)
Basophils Relative: 0.2 % (ref 0.0–3.0)
EOS ABS: 0.1 10*3/uL (ref 0.0–0.7)
Eosinophils Relative: 3.6 % (ref 0.0–5.0)
HEMATOCRIT: 45.8 % (ref 39.0–52.0)
HEMOGLOBIN: 15.2 g/dL (ref 13.0–17.0)
LYMPHS ABS: 1.1 10*3/uL (ref 0.7–4.0)
Lymphocytes Relative: 27.1 % (ref 12.0–46.0)
MCHC: 33.2 g/dL (ref 30.0–36.0)
MCV: 91.6 fl (ref 78.0–100.0)
MONO ABS: 0.2 10*3/uL (ref 0.1–1.0)
Monocytes Relative: 6.1 % (ref 3.0–12.0)
NEUTROS ABS: 2.5 10*3/uL (ref 1.4–7.7)
Neutrophils Relative %: 63 % (ref 43.0–77.0)
Platelets: 132 10*3/uL — ABNORMAL LOW (ref 150.0–400.0)
RBC: 5 Mil/uL (ref 4.22–5.81)
RDW: 14.1 % (ref 11.5–15.5)
WBC: 4 10*3/uL (ref 4.0–10.5)

## 2014-05-11 NOTE — Progress Notes (Signed)
F/u cbc ordered.

## 2014-05-13 ENCOUNTER — Telehealth: Payer: Self-pay | Admitting: Internal Medicine

## 2014-05-13 NOTE — Telephone Encounter (Signed)
Pt wants to come to lab 05/25/14 between 9:00 and 11:00.  Please schedule and notify him of appt time.  Thanks.

## 2014-05-14 ENCOUNTER — Telehealth: Payer: Self-pay

## 2014-05-14 NOTE — Telephone Encounter (Signed)
Ok.  Yes I spoke with him regarding his counts.  I left you a message on Friday that he just wants to follow for now.  Thanks.

## 2014-05-14 NOTE — Telephone Encounter (Signed)
The patient called and wants to cancel his hematology referral.

## 2014-05-25 ENCOUNTER — Other Ambulatory Visit (INDEPENDENT_AMBULATORY_CARE_PROVIDER_SITE_OTHER): Payer: Medicare Other

## 2014-05-25 ENCOUNTER — Other Ambulatory Visit: Payer: Self-pay | Admitting: *Deleted

## 2014-05-25 DIAGNOSIS — D696 Thrombocytopenia, unspecified: Secondary | ICD-10-CM

## 2014-05-26 LAB — CBC WITH DIFFERENTIAL/PLATELET
BASOS ABS: 0 10*3/uL (ref 0.0–0.1)
Basophils Relative: 0.4 % (ref 0.0–3.0)
EOS ABS: 0.1 10*3/uL (ref 0.0–0.7)
Eosinophils Relative: 2.9 % (ref 0.0–5.0)
HCT: 48.8 % (ref 39.0–52.0)
Hemoglobin: 16.1 g/dL (ref 13.0–17.0)
LYMPHS PCT: 22.3 % (ref 12.0–46.0)
Lymphs Abs: 1.1 10*3/uL (ref 0.7–4.0)
MCHC: 33 g/dL (ref 30.0–36.0)
MCV: 93 fl (ref 78.0–100.0)
Monocytes Absolute: 0.3 10*3/uL (ref 0.1–1.0)
Monocytes Relative: 5.4 % (ref 3.0–12.0)
Neutro Abs: 3.3 10*3/uL (ref 1.4–7.7)
Neutrophils Relative %: 69 % (ref 43.0–77.0)
PLATELETS: 144 10*3/uL — AB (ref 150.0–400.0)
RBC: 5.25 Mil/uL (ref 4.22–5.81)
RDW: 14.4 % (ref 11.5–15.5)
WBC: 4.8 10*3/uL (ref 4.0–10.5)

## 2014-05-27 ENCOUNTER — Telehealth: Payer: Self-pay | Admitting: Internal Medicine

## 2014-05-27 ENCOUNTER — Encounter: Payer: Self-pay | Admitting: Internal Medicine

## 2014-05-27 DIAGNOSIS — D696 Thrombocytopenia, unspecified: Secondary | ICD-10-CM

## 2014-05-27 NOTE — Telephone Encounter (Signed)
Pt notified of lab results via my chart.  Needs a non fasting lab appt in 2 months.  Please schedule and contact him with an appt date and time.  Thanks.

## 2014-07-30 ENCOUNTER — Other Ambulatory Visit (INDEPENDENT_AMBULATORY_CARE_PROVIDER_SITE_OTHER): Payer: Medicare Other

## 2014-07-30 DIAGNOSIS — D696 Thrombocytopenia, unspecified: Secondary | ICD-10-CM

## 2014-07-31 ENCOUNTER — Encounter: Payer: Self-pay | Admitting: Internal Medicine

## 2014-07-31 LAB — PLATELET COUNT: PLATELETS: 168 10*3/uL (ref 150–400)

## 2014-10-03 ENCOUNTER — Encounter: Payer: Self-pay | Admitting: *Deleted

## 2014-10-04 ENCOUNTER — Encounter: Payer: Self-pay | Admitting: Internal Medicine

## 2014-10-04 MED ORDER — PIMECROLIMUS 1 % EX CREA: TOPICAL_CREAM | Freq: Two times a day (BID) | CUTANEOUS | Status: DC

## 2014-10-04 NOTE — Telephone Encounter (Signed)
rx sent in for elidel cream.

## 2014-10-10 ENCOUNTER — Ambulatory Visit (INDEPENDENT_AMBULATORY_CARE_PROVIDER_SITE_OTHER): Payer: Medicare Other

## 2014-10-10 ENCOUNTER — Telehealth: Payer: Self-pay | Admitting: *Deleted

## 2014-10-10 ENCOUNTER — Ambulatory Visit: Payer: Medicare Other

## 2014-10-10 DIAGNOSIS — Z23 Encounter for immunization: Secondary | ICD-10-CM

## 2014-10-10 NOTE — Telephone Encounter (Signed)
Fax from [employer], needing PA for Elidel. Started online, printed for Dr. Nicki Reaper to complete and sign.

## 2014-10-11 ENCOUNTER — Telehealth: Payer: Self-pay | Admitting: *Deleted

## 2014-10-11 NOTE — Telephone Encounter (Signed)
Spoke with pt, he states he has not used anything else however is willing to try anything that is less expense and is covered by insurance for Rosacea.  Please advise

## 2014-10-11 NOTE — Telephone Encounter (Signed)
If it is for rosacea, then can try metrogel facial cream - apply topically bid.  Can see if he has tried this and had any problems.  If no, then ok to send in rx.

## 2014-10-12 MED ORDER — METRONIDAZOLE 0.75 % EX GEL: 1.0000 "application " | Freq: Two times a day (BID) | CUTANEOUS | Status: DC

## 2014-10-12 NOTE — Telephone Encounter (Signed)
Noted  

## 2014-10-12 NOTE — Telephone Encounter (Signed)
Pt states he has not used anything else.  He will try the Metrogel Cream.  Rx sent to pharmacy.

## 2014-12-17 ENCOUNTER — Encounter: Payer: Self-pay | Admitting: Internal Medicine

## 2014-12-17 ENCOUNTER — Ambulatory Visit (INDEPENDENT_AMBULATORY_CARE_PROVIDER_SITE_OTHER): Payer: Medicare Other | Admitting: Internal Medicine

## 2014-12-17 VITALS — BP 118/70 | HR 66 | Temp 98.8°F | Ht 67.5 in | Wt 197.1 lb

## 2014-12-17 DIAGNOSIS — E7800 Pure hypercholesterolemia, unspecified: Secondary | ICD-10-CM

## 2014-12-17 DIAGNOSIS — D329 Benign neoplasm of meninges, unspecified: Secondary | ICD-10-CM

## 2014-12-17 DIAGNOSIS — D696 Thrombocytopenia, unspecified: Secondary | ICD-10-CM

## 2014-12-17 DIAGNOSIS — Z0000 Encounter for general adult medical examination without abnormal findings: Secondary | ICD-10-CM

## 2014-12-17 DIAGNOSIS — H9190 Unspecified hearing loss, unspecified ear: Secondary | ICD-10-CM

## 2014-12-17 DIAGNOSIS — Z125 Encounter for screening for malignant neoplasm of prostate: Secondary | ICD-10-CM

## 2014-12-17 LAB — CBC WITH DIFFERENTIAL/PLATELET
BASOS ABS: 0 10*3/uL (ref 0.0–0.1)
Basophils Relative: 0.3 % (ref 0.0–3.0)
Eosinophils Absolute: 0.2 10*3/uL (ref 0.0–0.7)
Eosinophils Relative: 3.3 % (ref 0.0–5.0)
HCT: 44.7 % (ref 39.0–52.0)
Hemoglobin: 15.1 g/dL (ref 13.0–17.0)
LYMPHS PCT: 25 % (ref 12.0–46.0)
Lymphs Abs: 1.7 10*3/uL (ref 0.7–4.0)
MCHC: 33.8 g/dL (ref 30.0–36.0)
MCV: 91.3 fl (ref 78.0–100.0)
Monocytes Absolute: 0.5 10*3/uL (ref 0.1–1.0)
Monocytes Relative: 7.5 % (ref 3.0–12.0)
Neutro Abs: 4.3 10*3/uL (ref 1.4–7.7)
Neutrophils Relative %: 63.9 % (ref 43.0–77.0)
PLATELETS: 245 10*3/uL (ref 150.0–400.0)
RBC: 4.9 Mil/uL (ref 4.22–5.81)
RDW: 13.8 % (ref 11.5–15.5)
WBC: 6.7 10*3/uL (ref 4.0–10.5)

## 2014-12-17 LAB — HEPATIC FUNCTION PANEL
ALT: 35 U/L (ref 0–53)
AST: 25 U/L (ref 0–37)
Albumin: 4.5 g/dL (ref 3.5–5.2)
Alkaline Phosphatase: 94 U/L (ref 39–117)
Bilirubin, Direct: 0.1 mg/dL (ref 0.0–0.3)
TOTAL PROTEIN: 6.7 g/dL (ref 6.0–8.3)
Total Bilirubin: 0.9 mg/dL (ref 0.2–1.2)

## 2014-12-17 LAB — LIPID PANEL
CHOLESTEROL: 243 mg/dL — AB (ref 0–200)
HDL: 35.1 mg/dL — AB (ref >39.00)
NonHDL: 207.9
TRIGLYCERIDES: 320 mg/dL — AB (ref 0.0–149.0)
Total CHOL/HDL Ratio: 7
VLDL: 64 mg/dL — AB (ref 0.0–40.0)

## 2014-12-17 LAB — BASIC METABOLIC PANEL
BUN: 14 mg/dL (ref 6–23)
CALCIUM: 9.4 mg/dL (ref 8.4–10.5)
CO2: 29 mEq/L (ref 19–32)
Chloride: 103 mEq/L (ref 96–112)
Creatinine, Ser: 0.91 mg/dL (ref 0.40–1.50)
GFR: 85.77 mL/min (ref >60.00)
GLUCOSE: 84 mg/dL (ref 70–99)
Potassium: 4.3 mEq/L (ref 3.5–5.1)
SODIUM: 139 meq/L (ref 135–145)

## 2014-12-17 LAB — LDL CHOLESTEROL, DIRECT: Direct LDL: 163 mg/dL

## 2014-12-17 LAB — PSA, MEDICARE: PSA: 0.35 ng/ml (ref 0.10–4.00)

## 2014-12-17 LAB — TSH: TSH: 3.33 u[IU]/mL (ref 0.35–4.50)

## 2014-12-17 NOTE — Assessment & Plan Note (Signed)
Last cbc revealed normal platelet count.  Recheck cbc today.

## 2014-12-17 NOTE — Assessment & Plan Note (Signed)
S/p removal.  Followed by neurosurgery.   

## 2014-12-17 NOTE — Progress Notes (Signed)
[name], male   DOB: 04/20/1935, 80 y.o.   MRN: [id number]   Subjective:    [name], male    DOB: July 11, 1935, 80 y.o.   MRN: [id number]  HPI  Patient here for his physical exam.  States he is doing well.  Feels good. Stays active.  Has been playing golf.  No cardiac symptoms with increased activity or exertion.  Breathing stable.  No nausea or vomiting.  No bowel change.  No abdominal pain. Has been having problems with his eyes.  Increased pressure.  Followed by Dr Thomasene Ripple.  Just had his ears checked out two weeks ago.  Ok.     Past Medical History  Diagnosis Date  . Hypercholesterolemia   . History of colonic polyps   . Acoustic neuroma   . Rosacea   . Rheumatic fever age 54  . Scarlet fever age 69  . History of craniotomy   . Psoriasis   . Macular degeneration     Current Outpatient Prescriptions on File Prior to Visit  Medication Sig Dispense Refill  . aspirin EC 81 MG tablet Take 81 mg by mouth daily.    . fluticasone (FLONASE) 50 MCG/ACT nasal spray USE TWO SPRAYS IN EACH NOSTRIL EACH DAY AS DIRECTED BY PHYSICIAN 16 g 5  . metroNIDAZOLE (METROGEL) 0.75 % gel Apply 1 application topically 2 (two) times daily. 45 g 0  . Multiple Vitamins-Minerals (VISION-VITE PRESERVE PO) Take by mouth.    . pimecrolimus (ELIDEL) 1 % cream Apply topically 2 (two) times daily. 30 g 0   No current facility-administered medications on file prior to visit.    Review of Systems  Constitutional: Negative for appetite change and unexpected weight change.  HENT: Negative for congestion and sinus pressure.        Increased pressure in his eyes.  Seeing opthalmology.  Pressures improving.   Respiratory: Negative for cough, chest tightness and shortness of breath.   Cardiovascular: Negative for chest pain, palpitations and leg swelling.  Gastrointestinal: Negative for nausea, vomiting, abdominal pain and diarrhea.  Genitourinary: Negative for difficulty  urinating.  Skin: Negative for color change and rash.  Neurological: Negative for dizziness, light-headedness and headaches.       Objective:    Physical Exam  Constitutional: He is oriented to person, place, and time. He appears well-developed and well-nourished. No distress.  HENT:  Head: Normocephalic and atraumatic.  Nose: Nose normal.  Mouth/Throat: Oropharynx is clear and moist. No oropharyngeal exudate.  Eyes: Conjunctivae are normal. Right eye exhibits no discharge. Left eye exhibits no discharge.  Neck: Neck supple. No thyromegaly present.  Cardiovascular: Normal rate and regular rhythm.   I/VI systolic murmur  Pulmonary/Chest: Breath sounds normal. No respiratory distress. He has no wheezes.  Abdominal: Soft. Bowel sounds are normal. There is no tenderness.  Genitourinary:  Rectal exam performed - no prostate nodule appreciated.  Heme negative.    Musculoskeletal: He exhibits no edema or tenderness.  Lymphadenopathy:    He has no cervical adenopathy.  Neurological: He is alert and oriented to person, place, and time.  Skin: Skin is warm and dry. No rash noted.  Psychiatric: He has a normal mood and affect. His behavior is normal.    BP 118/70 mmHg  Pulse 66  Temp(Src) 98.8 F (37.1 C) (Oral)  Ht 5' 7.5" (1.715 m)  Wt 197 lb 2 oz (89.415 kg)  BMI 30.40 kg/m2  SpO2 94% Wt Readings from Last 3  Encounters:  12/17/14 197 lb 2 oz (89.415 kg)  04/12/14 191 lb (86.637 kg)  12/07/13 190 lb 12 oz (86.524 kg)     Lab Results  Component Value Date   WBC 4.8 05/25/2014   HGB 16.1 05/25/2014   HCT 48.8 05/25/2014   PLT 168 07/30/2014   GLUCOSE 100* 04/12/2014   CHOL 218* 04/12/2014   TRIG 269.0* 04/12/2014   HDL 31.20* 04/12/2014   LDLDIRECT 157.2 10/27/2012   LDLCALC 133* 04/12/2014   ALT 33 04/12/2014   AST 26 04/12/2014   NA 138 04/12/2014   K 4.4 04/12/2014   CL 104 04/12/2014   CREATININE 0.9 04/12/2014   BUN 16 04/12/2014   CO2 28 04/12/2014   TSH  1.20 04/12/2014   PSA 0.30 12/14/2013       Assessment & Plan:   Problem List Items Addressed This Visit    Health care maintenance    Physical today.  Check psa today.  Colonoscopy 07/23/11.  Recommended f/u colonoscopy in 3-5 years.  States saw GI within the last year.  States told did not need another colonoscopy.  Follow.       Hearing loss    S/p cochlear implant.  Doing well.  Follow.  Just evaluated two weeks ago.        Hypercholesterolemia - Primary    Low cholesterol diet and exercise.  Follow lipid panel.        Relevant Orders   Lipid panel   Hepatic function panel   Basic metabolic panel   Meningioma    S/p removal.  Followed by neurosurgery.        Thrombocytopenia    Last cbc revealed normal platelet count.  Recheck cbc today.        Relevant Orders   CBC with Differential/Platelet   TSH    Other Visit Diagnoses    Prostate cancer screening        Relevant Orders    PSA, Medicare      I spent 25 minutes with the patient and more than 50% of the time was spent in consultation regarding the above.     Benjamin Pheasant, MD

## 2014-12-17 NOTE — Assessment & Plan Note (Signed)
Physical today.  Check psa today.  Colonoscopy 07/23/11.  Recommended f/u colonoscopy in 3-5 years.  States saw GI within the last year.  States told did not need another colonoscopy.  Follow.

## 2014-12-17 NOTE — Progress Notes (Signed)
Pre visit review using our clinic review tool, if applicable. No additional management support is needed unless otherwise documented below in the visit note. 

## 2014-12-17 NOTE — Assessment & Plan Note (Signed)
S/p cochlear implant.  Doing well.  Follow.  Just evaluated two weeks ago.

## 2014-12-17 NOTE — Assessment & Plan Note (Signed)
Low cholesterol diet and exercise.  Follow lipid panel.   

## 2014-12-18 ENCOUNTER — Encounter: Payer: Self-pay | Admitting: Internal Medicine

## 2014-12-26 ENCOUNTER — Encounter: Payer: Self-pay | Admitting: Internal Medicine

## 2015-01-09 ENCOUNTER — Ambulatory Visit
Admit: 2015-01-09 | Disposition: A | Discharge status: Home / Self Care | Payer: Self-pay | Attending: Ophthalmology | Admitting: Ophthalmology

## 2015-01-19 IMAGING — US ABDOMEN ULTRASOUND
1 series · 13 of 25 positions shown · non-contrast
Comparison: Abdominal ultrasound dated July 06, 2011

CLINICAL DATA: Nausea and abdominal pain

EXAM:
ULTRASOUND ABDOMEN COMPLETE

[Series 1: abdomen ultrasound · 0.31mm/px · 13 of 69 slices shown]
[im 1/69]
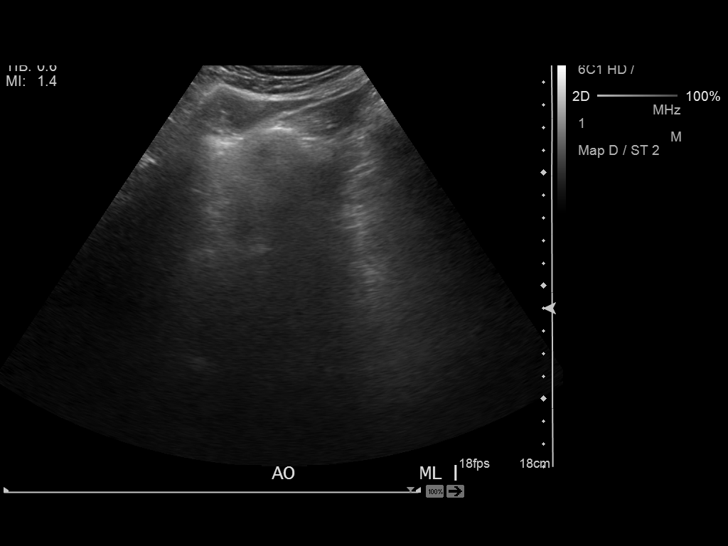
[im 6/69]
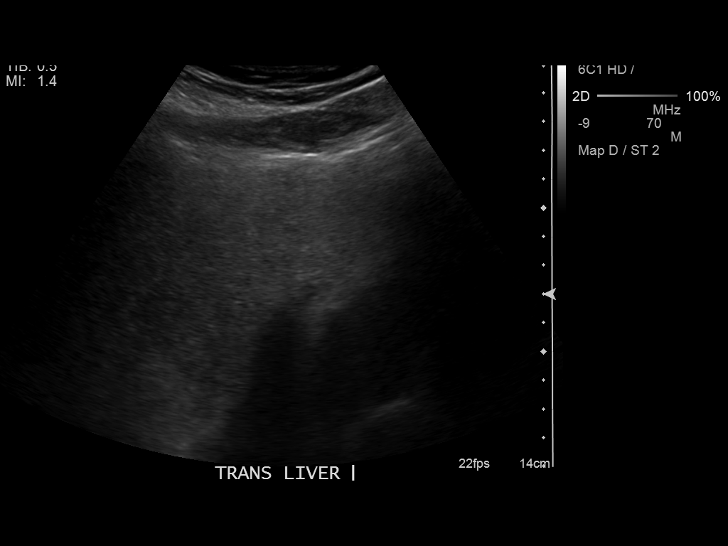
[im 12/69]
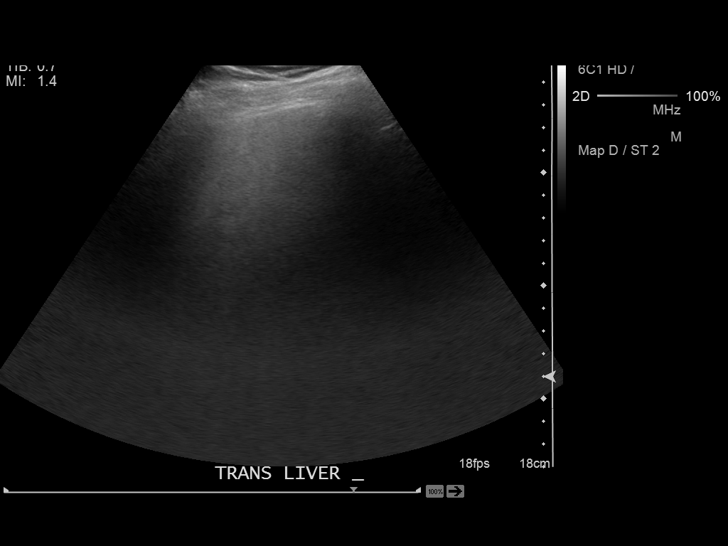
[im 18/69]
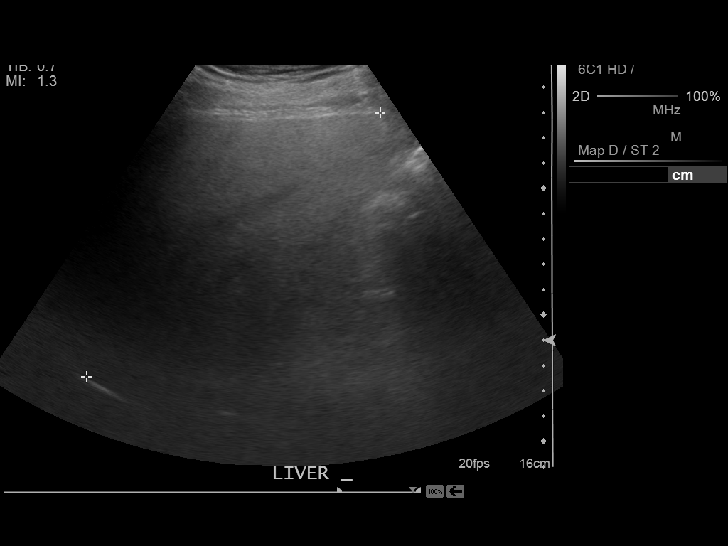
[im 23/69]
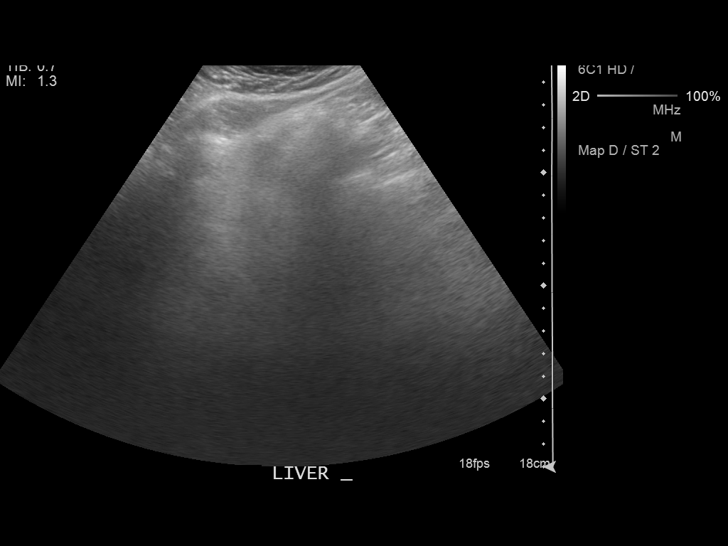
[im 29/69]
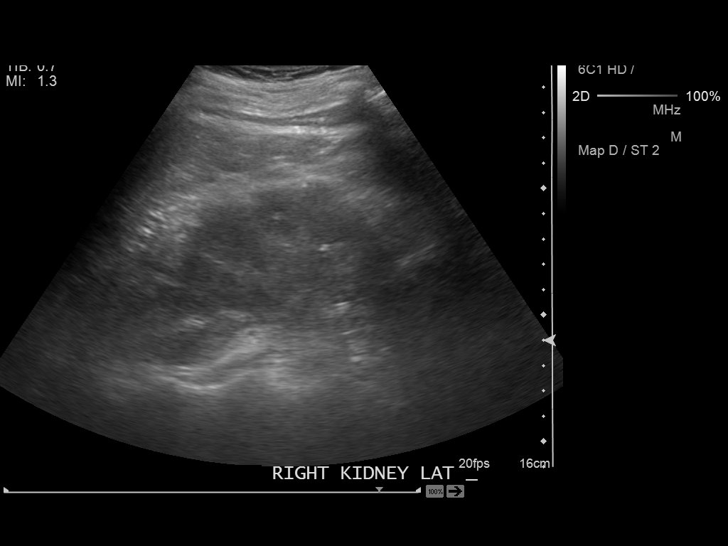
[im 35/69]
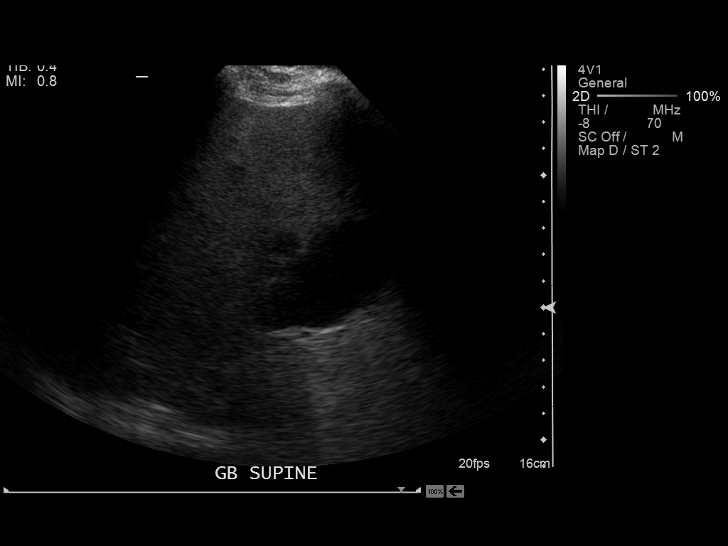
[im 40/69]
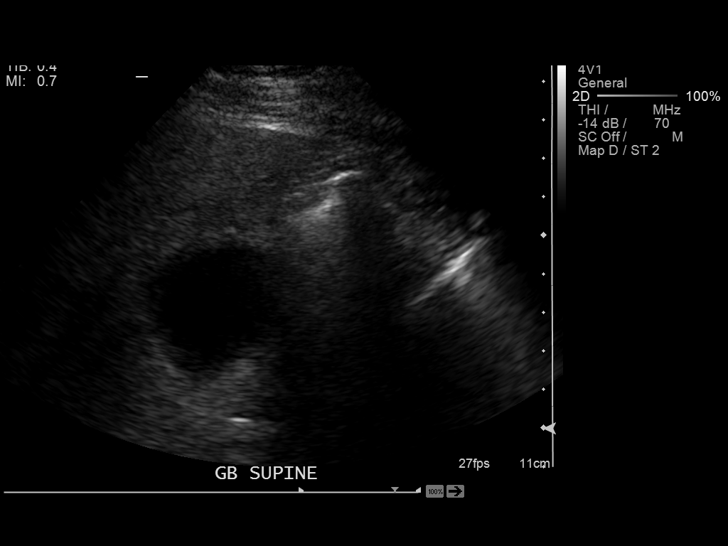
[im 46/69]
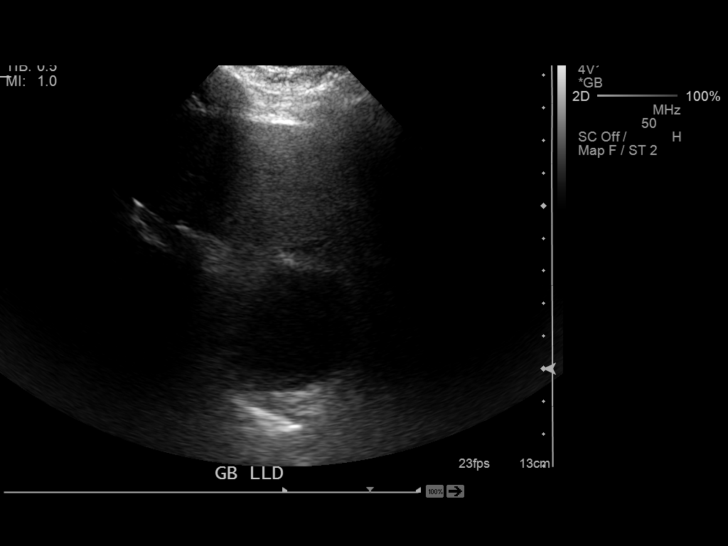
[im 52/69]
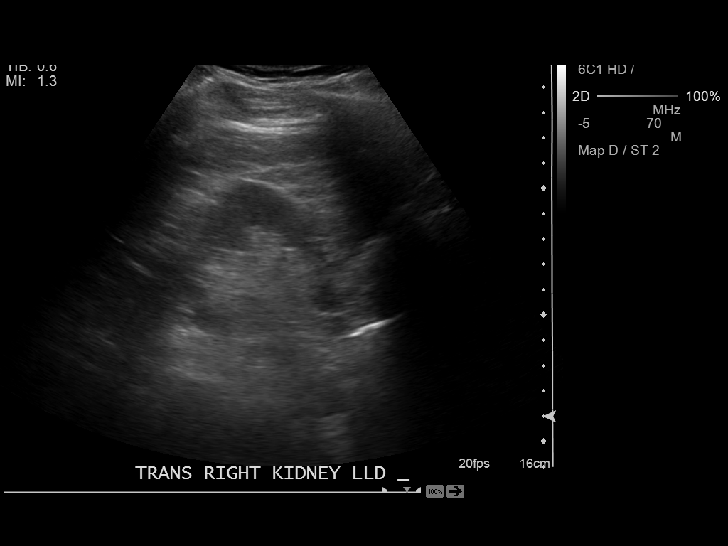
[im 57/69]
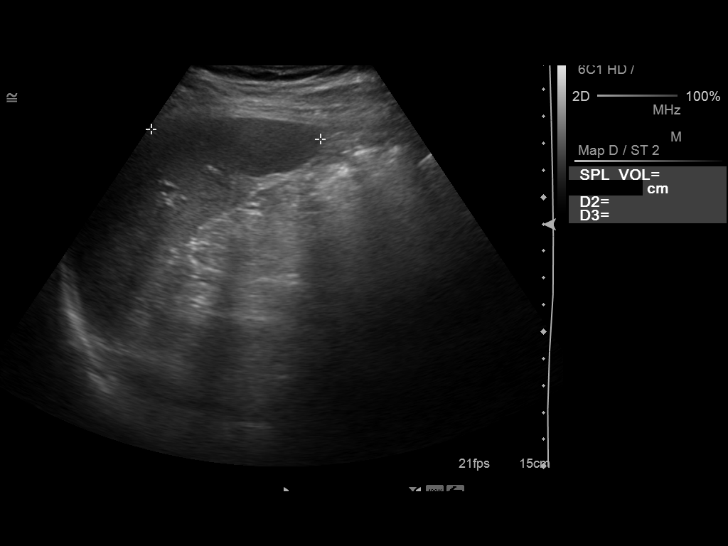
[im 63/69]
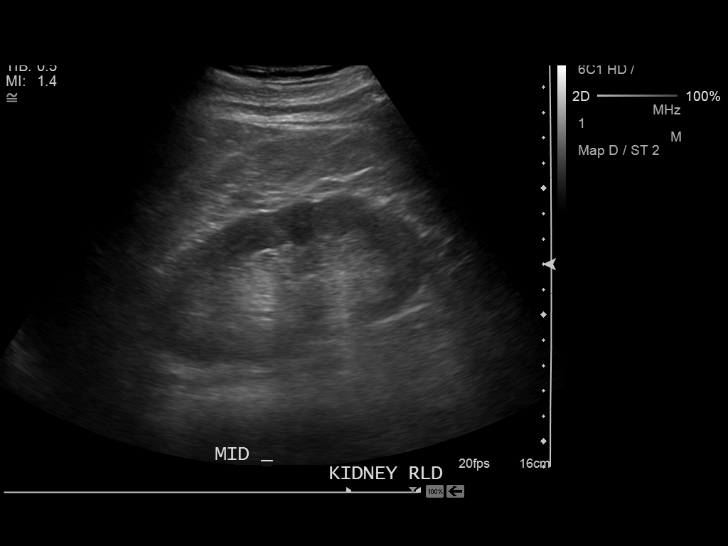
[im 69/69]
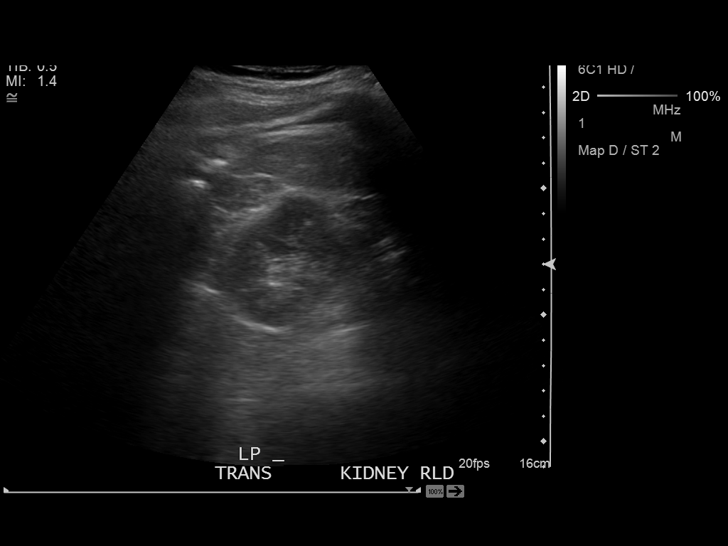

[13 of 25 positions shown; findings below may reference images not displayed]

FINDINGS: Gallbladder:

The gallbladder is adequately distended. No echogenic stones are
demonstrated. Sludge may be present. The gallbladder wall measures
2.6 mm in thickness. There is no pericholecystic fluid. There is no
positive sonographic Murphy's sign.

Common bile duct:

Diameter: 3.8 mm

Liver:

The echotexture of the liver is mildly increased which suggests
fatty infiltration. There is no focal mass nor ductal dilation.

IVC:

The IVC is obscured by bowel gas.

Pancreas:

The pancreas is obscured by bowel gas.

Spleen:

The spleen is normal in echotexture and measures 6.3 cm in greatest
dimension.

Right Kidney:

Length: 12.6 cm.. Echogenicity within normal limits. No mass or
hydronephrosis visualized.

Left Kidney:

Length: 11.8 cm. Echogenicity within normal limits. No mass or
hydronephrosis visualized.

Abdominal aorta:

The abdominal aorta is largely obscured by bowel gas.

Other findings:

No ascites is demonstrated.
IMPRESSION: 1. No gallstones are evident but sludge cannot be absolutely
excluded. There is no sonographic evidence of acute cholecystitis.
The common bile duct and liver exhibit no acute abnormalities though
there is likely fatty infiltrative change of the liver.
2. The kidneys exhibit no acute abnormalities.
3. The pancreas and abdominal aorta and inferior vena cava are
largely obscured by bowel gas.

## 2015-01-21 ENCOUNTER — Ambulatory Visit
Admit: 2015-01-21 | Disposition: A | Discharge status: Home / Self Care | Payer: Self-pay | Attending: Ophthalmology | Admitting: Ophthalmology

## 2015-02-03 NOTE — Op Note (Signed)
PATIENT NAME:  Benjamin Carney, Benjamin Carney MR#:  [id number] DATE OF BIRTH:  07/18/1935  DATE OF PROCEDURE:  01/21/2015  PREOPERATIVE DIAGNOSIS:  Cataract, right eye.   POSTOPERATIVE DIAGNOSIS:  Cataract, right eye.   PROCEDURE PERFORMED:  Extracapsular cataract extraction using phacoemulsification and placement of Alcon [id number] 20.0 diopter posterior chamber lens, serial number [id number].   SURGEON:  Loura Back. Sabriya Yono, MD   ASSISTANT:  None.  ANESTHESIA:  4% lidocaine, 0.75% Marcaine, a 50/50 mixture with 10 units/mL of HyoMax added given as peribulbar.   ANESTHESIOLOGIST:  Dr. Marcello Moores.   COMPLICATIONS:  None.   ESTIMATED BLOOD LOSS:  Less than 1 mL.   TYPE OF CATARACT:  Nuclear sclerotic cataract.   DESCRIPTION OF PROCEDURE:  The patient was brought to the operating room and given a peribulbar block.  The patient was then prepped and draped in the usual fashion.  The vertical rectus muscles were imbricated using 5-0 silk sutures.  These sutures were then clamped to the sterile drapes as bridle sutures.  A limbal peritomy was performed extending two clock hours and hemostasis was obtained with cautery.  A partial thickness scleral groove was made at the surgical limbus and dissected anteriorly in a lamellar dissection using an Alcon crescent knife.  The anterior chamber was entered superonasally with a Superblade and through the lamellar dissection with a 2.6 mm keratome.  DisCoVisc was used to replace the aqueous and a continuous tear capsulorrhexis was carried out.  Hydrodissection and hydrodelineation were carried out with balanced salt and a 27 gauge canula.  The nucleus was rotated to confirm the effectiveness of the hydrodissection.  Phacoemulsification was carried out using a divide-and-conquer technique. Total ultrasound time was 1 minute and 4.3 seconds, average power 25.9%, CD of 29.22.  No suture was placed.  Irrigation/aspiration was used to remove the residual cortex.  DisCoVisc was  used to inflate the capsule and the internal incision was enlarged to 3 mm with the crescent knife.  The intraocular lens was folded and inserted into the capsular bag using the AcrySert delivery system and 10 mL containing 1 mg of drug was injected via the paracentesis track. Irrigation/aspiration was used to remove the residual DisCoVisc   Miostat was injected into the anterior chamber through the paracentesis track to inflate the anterior chamber and induce miosis.  The wound was checked for leaks and none were found. The conjunctiva was closed with cautery and the bridle sutures were removed.  Two drops of 0.3% Vigamox were placed on the eye.   An eye shield was placed on the eye.  The patient was discharged to the recovery room in good condition.   ____________________________ Loura Back Honesti Seaberg, MD sad:kc D: 01/21/2015 13:14:00 ET T: 01/21/2015 17:54:51 ET JOB#: [id number]  cc: Remo Lipps A. Ekam Bonebrake, MD, <Dictator> Martie Lee MD ELECTRONICALLY SIGNED 01/28/2015 13:53

## 2015-03-18 ENCOUNTER — Other Ambulatory Visit: Payer: Self-pay | Admitting: Internal Medicine

## 2015-04-09 ENCOUNTER — Encounter: Payer: Self-pay | Admitting: Internal Medicine

## 2015-04-12 NOTE — Telephone Encounter (Signed)
Unread mychart message mailed to patient 

## 2015-06-19 ENCOUNTER — Encounter: Payer: Self-pay | Admitting: Internal Medicine

## 2015-06-19 ENCOUNTER — Ambulatory Visit (INDEPENDENT_AMBULATORY_CARE_PROVIDER_SITE_OTHER): Payer: Medicare Other | Admitting: Internal Medicine

## 2015-06-19 VITALS — BP 110/70 | HR 60 | Temp 97.9°F | Ht 67.5 in | Wt 195.5 lb

## 2015-06-19 DIAGNOSIS — D696 Thrombocytopenia, unspecified: Secondary | ICD-10-CM

## 2015-06-19 DIAGNOSIS — D329 Benign neoplasm of meninges, unspecified: Secondary | ICD-10-CM | POA: Diagnosis not present

## 2015-06-19 DIAGNOSIS — E7800 Pure hypercholesterolemia, unspecified: Secondary | ICD-10-CM

## 2015-06-19 DIAGNOSIS — H9190 Unspecified hearing loss, unspecified ear: Secondary | ICD-10-CM | POA: Diagnosis not present

## 2015-06-19 LAB — COMPREHENSIVE METABOLIC PANEL
ALBUMIN: 4.3 g/dL (ref 3.5–5.2)
ALT: 34 U/L (ref 0–53)
AST: 27 U/L (ref 0–37)
Alkaline Phosphatase: 92 U/L (ref 39–117)
BILIRUBIN TOTAL: 1.3 mg/dL — AB (ref 0.2–1.2)
BUN: 13 mg/dL (ref 6–23)
CALCIUM: 9.5 mg/dL (ref 8.4–10.5)
CO2: 30 meq/L (ref 19–32)
CREATININE: 0.92 mg/dL (ref 0.40–1.50)
Chloride: 102 mEq/L (ref 96–112)
GFR: 84.59 mL/min (ref >60.00)
Glucose, Bld: 81 mg/dL (ref 70–99)
Potassium: 4.4 mEq/L (ref 3.5–5.1)
SODIUM: 139 meq/L (ref 135–145)
Total Protein: 6.9 g/dL (ref 6.0–8.3)

## 2015-06-19 LAB — LIPID PANEL
CHOL/HDL RATIO: 6
Cholesterol: 225 mg/dL — ABNORMAL HIGH (ref 0–200)
HDL: 35.5 mg/dL — ABNORMAL LOW (ref >39.00)
NonHDL: 189.4
Triglycerides: 282 mg/dL — ABNORMAL HIGH (ref 0.0–149.0)
VLDL: 56.4 mg/dL — AB (ref 0.0–40.0)

## 2015-06-19 LAB — LDL CHOLESTEROL, DIRECT: Direct LDL: 156 mg/dL

## 2015-06-19 NOTE — Progress Notes (Signed)
Pre-visit discussion using our clinic review tool. No additional management support is needed unless otherwise documented below in the visit note.  

## 2015-06-19 NOTE — Progress Notes (Signed)
[name], male   DOB: 04-12-1935, 80 y.o.   MRN: [id number]   Subjective:    [name], male    DOB: 19-May-1935, 80 y.o.   MRN: [id number]  HPI  Patient here for a scheduled follow up.  He is here to follow up on his cholesterol.  Seeing Dr Thomasene Ripple.  Pressure has improved on timolol.  Uses flonase for allergies.  No significant sinus issues.  Tries to stay active.  No cardiac symptoms with increased activity or exertion.  No sob.  No abdominal pain or cramping.  Bowels stable.    Past Medical History  Diagnosis Date  . Hypercholesterolemia   . History of colonic polyps   . Acoustic neuroma   . Rosacea   . Rheumatic fever age 55  . Scarlet fever age 47  . History of craniotomy   . Psoriasis   . Macular degeneration    Past Surgical History  Procedure Laterality Date  . Appendectomy  1959  . Bunionectomy    . Craniotomy for meningoma    . Craniectomy for excision of acoustic neuroma  1994  . Cochlear implant      acute hearing loss   Family History  Problem Relation Age of Onset  . Hypertension Mother   . Heart disease Mother   . Cancer Father 85    prostate  . Stroke Father   . Colon cancer Neg Hx    Social History   Social History  . Marital Status: Single    Spouse Name: N/A  . Number of Children: 3  . Years of Education: N/A   Social History Main Topics  . Smoking status: Former Smoker    Quit date: 10/05/2000  . Smokeless tobacco: Never Used  . Alcohol Use: 0.0 oz/week    0 Standard drinks or equivalent per week     Comment: occasional wine  . Drug Use: No  . Sexual Activity: Not Asked   Other Topics Concern  . None   Social History Narrative    Outpatient Encounter Prescriptions as of 06/19/2015  Medication Sig  . aspirin EC 81 MG tablet Take 81 mg by mouth daily.  . fluticasone (FLONASE) 50 MCG/ACT nasal spray USE 2 SPRAYS EACH NOSTRIL DAILY  . Multiple Vitamins-Minerals (VISION-VITE PRESERVE PO) Take by  mouth.  . pimecrolimus (ELIDEL) 1 % cream Apply topically 2 (two) times daily.  . timolol (TIMOPTIC) 0.5 % ophthalmic solution Place 1 drop into both eyes daily.  . [DISCONTINUED] metroNIDAZOLE (METROGEL) 0.75 % gel Apply 1 application topically 2 (two) times daily.   No facility-administered encounter medications on file as of 06/19/2015.    Review of Systems  Constitutional: Negative for appetite change and unexpected weight change.  HENT: Negative for congestion and sinus pressure.   Eyes: Negative for pain and visual disturbance.       Pressure has improved.   Respiratory: Negative for cough, chest tightness and shortness of breath.   Cardiovascular: Negative for chest pain, palpitations and leg swelling.  Gastrointestinal: Negative for nausea, vomiting, abdominal pain and diarrhea.  Genitourinary: Negative for dysuria and difficulty urinating.  Musculoskeletal: Negative for back pain and joint swelling.  Skin: Negative for color change and rash.  Neurological: Negative for dizziness, light-headedness and headaches.  Psychiatric/Behavioral: Negative for dysphoric mood and agitation.       Objective:    Physical Exam  Constitutional: He appears well-developed and well-nourished. No distress.  HENT:  Nose: Nose  normal.  Mouth/Throat: Oropharynx is clear and moist.  Eyes: Conjunctivae are normal. Right eye exhibits no discharge. Left eye exhibits no discharge.  Neck: Neck supple. No thyromegaly present.  Cardiovascular: Normal rate and regular rhythm.   Pulmonary/Chest: Effort normal and breath sounds normal. No respiratory distress.  Abdominal: Soft. Bowel sounds are normal. There is no tenderness.  Musculoskeletal: He exhibits no edema or tenderness.  Lymphadenopathy:    He has no cervical adenopathy.  Skin: No rash noted. No erythema.  Psychiatric: He has a normal mood and affect. His behavior is normal.    BP 110/70 mmHg  Pulse 60  Temp(Src) 97.9 F (36.6 C) (Oral)   Ht 5' 7.5" (1.715 m)  Wt 195 lb 8 oz (88.678 kg)  BMI 30.15 kg/m2  SpO2 95% Wt Readings from Last 3 Encounters:  06/19/15 195 lb 8 oz (88.678 kg)  12/17/14 197 lb 2 oz (89.415 kg)  04/12/14 191 lb (86.637 kg)     Lab Results  Component Value Date   WBC 6.7 12/17/2014   HGB 15.1 12/17/2014   HCT 44.7 12/17/2014   PLT 245.0 12/17/2014   GLUCOSE 81 06/19/2015   CHOL 225* 06/19/2015   TRIG 282.0* 06/19/2015   HDL 35.50* 06/19/2015   LDLDIRECT 156.0 06/19/2015   LDLCALC 133* 04/12/2014   ALT 34 06/19/2015   AST 27 06/19/2015   NA 139 06/19/2015   K 4.4 06/19/2015   CL 102 06/19/2015   CREATININE 0.92 06/19/2015   BUN 13 06/19/2015   CO2 30 06/19/2015   TSH 3.33 12/17/2014   PSA 0.35 12/17/2014       Assessment & Plan:   Problem List Items Addressed This Visit    Hearing loss    S/p cochlear implant.  Doing well.        Hypercholesterolemia - Primary    Low cholesterol diet and exercise.  Follow lipid panel.       Relevant Orders   Lipid panel (Completed)   Comprehensive metabolic panel (Completed)   Meningioma    Followed by neurosurgery.  S/p removal.       Thrombocytopenia    Last cbc revealed a normal platelet count.  Follow.            Einar Pheasant, MD

## 2015-06-22 ENCOUNTER — Encounter: Payer: Self-pay | Admitting: Internal Medicine

## 2015-06-22 NOTE — Assessment & Plan Note (Signed)
Low cholesterol diet and exercise.  Follow lipid panel.   

## 2015-06-22 NOTE — Assessment & Plan Note (Signed)
Last cbc revealed a normal platelet count.  Follow.

## 2015-06-22 NOTE — Assessment & Plan Note (Signed)
S/p cochlear implant.  Doing well.  

## 2015-06-22 NOTE — Assessment & Plan Note (Signed)
Followed by neurosurgery.  S/p removal.

## 2015-07-03 ENCOUNTER — Other Ambulatory Visit (INDEPENDENT_AMBULATORY_CARE_PROVIDER_SITE_OTHER): Payer: Medicare Other

## 2015-07-03 ENCOUNTER — Telehealth: Payer: Self-pay | Admitting: *Deleted

## 2015-07-03 LAB — HEPATIC FUNCTION PANEL
ALBUMIN: 4.1 g/dL (ref 3.5–5.2)
ALK PHOS: 90 U/L (ref 39–117)
ALT: 33 U/L (ref 0–53)
AST: 23 U/L (ref 0–37)
BILIRUBIN DIRECT: 0.1 mg/dL (ref 0.0–0.3)
BILIRUBIN TOTAL: 0.9 mg/dL (ref 0.2–1.2)
Total Protein: 6.2 g/dL (ref 6.0–8.3)

## 2015-07-03 NOTE — Telephone Encounter (Signed)
Labs and dx?  

## 2015-07-03 NOTE — Telephone Encounter (Signed)
Order placed for liver panel.  

## 2015-07-04 ENCOUNTER — Encounter: Payer: Self-pay | Admitting: Internal Medicine

## 2015-09-03 ENCOUNTER — Ambulatory Visit (INDEPENDENT_AMBULATORY_CARE_PROVIDER_SITE_OTHER): Payer: Medicare Other

## 2015-09-03 DIAGNOSIS — Z23 Encounter for immunization: Secondary | ICD-10-CM | POA: Diagnosis not present

## 2015-12-11 ENCOUNTER — Encounter: Payer: Self-pay | Admitting: Internal Medicine

## 2015-12-11 ENCOUNTER — Ambulatory Visit (INDEPENDENT_AMBULATORY_CARE_PROVIDER_SITE_OTHER): Payer: Medicare Other | Admitting: Internal Medicine

## 2015-12-11 VITALS — BP 120/70 | HR 65 | Temp 97.7°F | Resp 18 | Ht 67.5 in | Wt 197.8 lb

## 2015-12-11 DIAGNOSIS — Z125 Encounter for screening for malignant neoplasm of prostate: Secondary | ICD-10-CM

## 2015-12-11 DIAGNOSIS — Z86018 Personal history of other benign neoplasm: Secondary | ICD-10-CM | POA: Diagnosis not present

## 2015-12-11 DIAGNOSIS — Z0000 Encounter for general adult medical examination without abnormal findings: Secondary | ICD-10-CM | POA: Diagnosis not present

## 2015-12-11 DIAGNOSIS — H9190 Unspecified hearing loss, unspecified ear: Secondary | ICD-10-CM

## 2015-12-11 DIAGNOSIS — E7800 Pure hypercholesterolemia, unspecified: Secondary | ICD-10-CM | POA: Diagnosis not present

## 2015-12-11 DIAGNOSIS — D696 Thrombocytopenia, unspecified: Secondary | ICD-10-CM

## 2015-12-11 LAB — LIPID PANEL
Cholesterol: 232 mg/dL — ABNORMAL HIGH (ref 0–200)
HDL: 34.7 mg/dL — ABNORMAL LOW (ref >39.00)
NonHDL: 197.71
Total CHOL/HDL Ratio: 7
Triglycerides: 313 mg/dL — ABNORMAL HIGH (ref 0.0–149.0)
VLDL: 62.6 mg/dL — ABNORMAL HIGH (ref 0.0–40.0)

## 2015-12-11 LAB — CBC WITH DIFFERENTIAL/PLATELET
BASOS PCT: 0.5 % (ref 0.0–3.0)
Basophils Absolute: 0 10*3/uL (ref 0.0–0.1)
EOS ABS: 0.2 10*3/uL (ref 0.0–0.7)
EOS PCT: 3.1 % (ref 0.0–5.0)
HCT: 48 % (ref 39.0–52.0)
HEMOGLOBIN: 16.1 g/dL (ref 13.0–17.0)
LYMPHS ABS: 1.4 10*3/uL (ref 0.7–4.0)
Lymphocytes Relative: 25.6 % (ref 12.0–46.0)
MCHC: 33.5 g/dL (ref 30.0–36.0)
MCV: 90.2 fl (ref 78.0–100.0)
MONO ABS: 0.5 10*3/uL (ref 0.1–1.0)
Monocytes Relative: 8.3 % (ref 3.0–12.0)
NEUTROS ABS: 3.5 10*3/uL (ref 1.4–7.7)
Neutrophils Relative %: 62.5 % (ref 43.0–77.0)
PLATELETS: 161 10*3/uL (ref 150.0–400.0)
RBC: 5.32 Mil/uL (ref 4.22–5.81)
RDW: 14 % (ref 11.5–15.5)
WBC: 5.6 10*3/uL (ref 4.0–10.5)

## 2015-12-11 LAB — HEPATIC FUNCTION PANEL
ALT: 32 U/L (ref 0–53)
AST: 23 U/L (ref 0–37)
Albumin: 4.5 g/dL (ref 3.5–5.2)
Alkaline Phosphatase: 95 U/L (ref 39–117)
BILIRUBIN DIRECT: 0.1 mg/dL (ref 0.0–0.3)
BILIRUBIN TOTAL: 0.9 mg/dL (ref 0.2–1.2)
Total Protein: 6.5 g/dL (ref 6.0–8.3)

## 2015-12-11 LAB — BASIC METABOLIC PANEL
BUN: 20 mg/dL (ref 6–23)
CO2: 30 mEq/L (ref 19–32)
Calcium: 9.5 mg/dL (ref 8.4–10.5)
Chloride: 103 mEq/L (ref 96–112)
Creatinine, Ser: 1.09 mg/dL (ref 0.40–1.50)
GFR: 69.47 mL/min (ref >60.00)
Glucose, Bld: 94 mg/dL (ref 70–99)
Potassium: 4.7 mEq/L (ref 3.5–5.1)
Sodium: 140 mEq/L (ref 135–145)

## 2015-12-11 LAB — TSH: TSH: 2.76 u[IU]/mL (ref 0.35–4.50)

## 2015-12-11 LAB — PSA, MEDICARE: PSA: 0.39 ng/mL (ref 0.10–4.00)

## 2015-12-11 LAB — LDL CHOLESTEROL, DIRECT: Direct LDL: 141 mg/dL

## 2015-12-11 NOTE — Assessment & Plan Note (Signed)
Low cholesterol diet and exercise.  Follow lipid panel.   

## 2015-12-11 NOTE — Assessment & Plan Note (Signed)
Recheck cbc today.   

## 2015-12-11 NOTE — Progress Notes (Signed)
Patient ID: TASHAUN LISTER, male   DOB: 04-Feb-1935, 81 y.o.   MRN: [id number]   Subjective:    Patient ID: GEAROLD PIER, male    DOB: 1934/11/27, 81 y.o.   MRN: [id number]  HPI  Patient with past history of meningioma and hypercholesterolemia.  He comes in today for his physical exam.  Doing well.  Has not been as active lately because of the weather, but plans to get more active.  He wants to go to the gym.  No chest pain or tightness with increased activity or exertion.  No sob.  No abdominal pain or cramping.  Bowels doing well.  No blood.  Doing well s/p cochlear transplant.  Overall feels good.     Past Medical History  Diagnosis Date  . Hypercholesterolemia   . History of colonic polyps   . Acoustic neuroma ([place])   . Rosacea   . Rheumatic fever age 38  . Scarlet fever age 70  . History of craniotomy   . Psoriasis   . Macular degeneration    Past Surgical History  Procedure Laterality Date  . Appendectomy  1959  . Bunionectomy    . Craniotomy for meningoma    . Craniectomy for excision of acoustic neuroma  1994  . Cochlear implant      acute hearing loss   Family History  Problem Relation Age of Onset  . Hypertension Mother   . Heart disease Mother   . Cancer Father 53    prostate  . Stroke Father   . Colon cancer Neg Hx    Social History   Social History  . Marital Status: Single    Spouse Name: N/A  . Number of Children: 3  . Years of Education: N/A   Social History Main Topics  . Smoking status: Former Smoker    Quit date: 10/05/2000  . Smokeless tobacco: Never Used  . Alcohol Use: 0.0 oz/week    0 Standard drinks or equivalent per week     Comment: occasional wine  . Drug Use: No  . Sexual Activity: Not Asked   Other Topics Concern  . None   Social History Narrative    Outpatient Encounter Prescriptions as of 12/11/2015  Medication Sig  . aspirin EC 81 MG tablet Take 81 mg by mouth daily.  . fluticasone (FLONASE) 50 MCG/ACT nasal spray USE 2  SPRAYS EACH NOSTRIL DAILY  . Multiple Vitamins-Minerals (VISION-VITE PRESERVE PO) Take by mouth.  . pimecrolimus (ELIDEL) 1 % cream Apply topically 2 (two) times daily.  . timolol (TIMOPTIC) 0.5 % ophthalmic solution Place 1 drop into both eyes daily.   No facility-administered encounter medications on file as of 12/11/2015.    Review of Systems  Constitutional: Negative for appetite change and unexpected weight change.  HENT: Negative for congestion and sinus pressure.   Eyes: Negative for pain and visual disturbance.  Respiratory: Negative for cough, chest tightness and shortness of breath.   Cardiovascular: Negative for chest pain, palpitations and leg swelling.  Gastrointestinal: Negative for nausea, vomiting, abdominal pain and diarrhea.  Genitourinary: Negative for dysuria and difficulty urinating.  Musculoskeletal: Negative for back pain and joint swelling.  Skin: Negative for color change and rash.  Neurological: Negative for dizziness, light-headedness and headaches.  Hematological: Negative for adenopathy. Does not bruise/bleed easily.  Psychiatric/Behavioral: Negative for dysphoric mood and agitation.       Objective:     Blood pressure rechecked by me:  118/72  Physical Exam  Constitutional: He is oriented to person, place, and time. He appears well-developed and well-nourished. No distress.  HENT:  Head: Normocephalic and atraumatic.  Nose: Nose normal.  Mouth/Throat: Oropharynx is clear and moist. No oropharyngeal exudate.  Eyes: Conjunctivae are normal. Right eye exhibits no discharge. Left eye exhibits no discharge.  Neck: Neck supple. No thyromegaly present.  Cardiovascular: Normal rate and regular rhythm.   Pulmonary/Chest: Breath sounds normal. No respiratory distress. He has no wheezes.  Abdominal: Soft. Bowel sounds are normal. There is no tenderness.  Genitourinary:  Rectal:  No palpable prostate nodules.  Heme negative.    Musculoskeletal: He exhibits no  edema or tenderness.  Lymphadenopathy:    He has no cervical adenopathy.  Neurological: He is alert and oriented to person, place, and time.  Skin: Skin is warm and dry. No rash noted. No erythema.  Psychiatric: He has a normal mood and affect. His behavior is normal.    BP 120/70 mmHg  Pulse 65  Temp(Src) 97.7 F (36.5 C) (Oral)  Resp 18  Ht 5' 7.5" (1.715 m)  Wt 197 lb 12 oz (89.699 kg)  BMI 30.50 kg/m2  SpO2 94% Wt Readings from Last 3 Encounters:  12/11/15 197 lb 12 oz (89.699 kg)  06/19/15 195 lb 8 oz (88.678 kg)  12/17/14 197 lb 2 oz (89.415 kg)     Lab Results  Component Value Date   WBC 6.7 12/17/2014   HGB 15.1 12/17/2014   HCT 44.7 12/17/2014   PLT 245.0 12/17/2014   GLUCOSE 81 06/19/2015   CHOL 225* 06/19/2015   TRIG 282.0* 06/19/2015   HDL 35.50* 06/19/2015   LDLDIRECT 156.0 06/19/2015   LDLCALC 133* 04/12/2014   ALT 33 07/03/2015   AST 23 07/03/2015   NA 139 06/19/2015   K 4.4 06/19/2015   CL 102 06/19/2015   CREATININE 0.92 06/19/2015   BUN 13 06/19/2015   CO2 30 06/19/2015   TSH 3.33 12/17/2014   PSA 0.35 12/17/2014       Assessment & Plan:   Problem List Items Addressed This Visit    Health care maintenance    Physical today 12/11/15.  Colonoscopy 07/23/11.  Recommended f/u colonoscopy in 3-5 years.  Saw GI.  States did not recommend another colonoscopy.  Discussed with him today.  Discussed cologuard.  He declines.  Follow.  Check psa today.        Hearing loss    S/p cochlear transplant.  Doing well.  Has f/u today.        History of meningioma - Primary    Was followed by neurosurgery.  S/p removal.  No follow warranted now.        Hypercholesterolemia    Low cholesterol diet and exercise.  Follow lipid panel.        Relevant Orders   TSH   Lipid panel   Hepatic function panel   Basic metabolic panel   Thrombocytopenia ([facility])    Recheck cbc today.        Relevant Orders   CBC with Differential/Platelet    Other Visit  Diagnoses    Prostate cancer screening        Relevant Orders    PSA, Medicare        Einar Pheasant, MD

## 2015-12-11 NOTE — Progress Notes (Signed)
Pre-visit discussion using our clinic review tool. No additional management support is needed unless otherwise documented below in the visit note.  

## 2015-12-11 NOTE — Assessment & Plan Note (Signed)
S/p cochlear transplant.  Doing well.  Has f/u today.

## 2015-12-11 NOTE — Assessment & Plan Note (Signed)
Was followed by neurosurgery.  S/p removal.  No follow warranted now.

## 2015-12-11 NOTE — Assessment & Plan Note (Signed)
Physical today 12/11/15.  Colonoscopy 07/23/11.  Recommended f/u colonoscopy in 3-5 years.  Saw GI.  States did not recommend another colonoscopy.  Discussed with him today.  Discussed cologuard.  He declines.  Follow.  Check psa today.

## 2015-12-13 ENCOUNTER — Encounter: Payer: Self-pay | Admitting: Internal Medicine

## 2015-12-16 ENCOUNTER — Other Ambulatory Visit: Payer: Self-pay | Admitting: Internal Medicine

## 2016-06-12 ENCOUNTER — Ambulatory Visit: Payer: Medicare Other | Admitting: Internal Medicine

## 2016-07-14 ENCOUNTER — Encounter: Payer: Self-pay | Admitting: Internal Medicine

## 2016-07-14 ENCOUNTER — Ambulatory Visit: Payer: Medicare Other | Admitting: Internal Medicine

## 2016-07-14 ENCOUNTER — Ambulatory Visit (INDEPENDENT_AMBULATORY_CARE_PROVIDER_SITE_OTHER): Payer: Medicare Other | Admitting: Internal Medicine

## 2016-07-14 VITALS — BP 102/66 | HR 64 | Temp 98.7°F | Wt 194.1 lb

## 2016-07-14 DIAGNOSIS — R0989 Other specified symptoms and signs involving the circulatory and respiratory systems: Secondary | ICD-10-CM

## 2016-07-14 DIAGNOSIS — Z86018 Personal history of other benign neoplasm: Secondary | ICD-10-CM

## 2016-07-14 DIAGNOSIS — Z86010 Personal history of colonic polyps: Secondary | ICD-10-CM

## 2016-07-14 DIAGNOSIS — E7800 Pure hypercholesterolemia, unspecified: Secondary | ICD-10-CM | POA: Diagnosis not present

## 2016-07-14 DIAGNOSIS — H9190 Unspecified hearing loss, unspecified ear: Secondary | ICD-10-CM

## 2016-07-14 DIAGNOSIS — D696 Thrombocytopenia, unspecified: Secondary | ICD-10-CM

## 2016-07-14 NOTE — Progress Notes (Signed)
Pre visit review using our clinic review tool, if applicable. No additional management support is needed unless otherwise documented below in the visit note. 

## 2016-07-14 NOTE — Assessment & Plan Note (Signed)
Last colonoscopy 2012 with polyps removed.  Recommended f/u colonoscopy in 3-5 years.  Discussed with him today.  He wants to hold on colonoscopy.  Will notify me if changes his mind.

## 2016-07-14 NOTE — Assessment & Plan Note (Signed)
S/p cochlear implant.  Doing well.  

## 2016-07-14 NOTE — Assessment & Plan Note (Signed)
Low cholesterol diet and exercise.  Follow lipid panel.  Has desired not to take medication.

## 2016-07-14 NOTE — Assessment & Plan Note (Signed)
Followed by neurosurgery.  S/p removal.  No f/u warranted.  Doing well.

## 2016-07-14 NOTE — Progress Notes (Signed)
[name], male   DOB: 1935/07/26, 81 y.o.   MRN: [id number]   Subjective:    [name], male    DOB: October 23, 1934, 81 y.o.   MRN: [id number]  HPI  Patient here for a scheduled follow up.  He is doing well.  Feels good.  Stays active.  Plays golf.  Works at [employer] course.  No chest pain.  No sob.  No acid reflux.  No abdominal pain or cramping.  Bowels stable.  No blood.     Past Medical History:  Diagnosis Date  . Acoustic neuroma ([place])   . History of colonic polyps   . History of craniotomy   . Hypercholesterolemia   . Macular degeneration   . Psoriasis   . Rheumatic fever age 80  . Rosacea   . Scarlet fever age 72   Past Surgical History:  Procedure Laterality Date  . APPENDECTOMY  1959  . BUNIONECTOMY    . COCHLEAR IMPLANT     acute hearing loss  . CRANIECTOMY FOR EXCISION OF ACOUSTIC NEUROMA  1994  . craniotomy for meningoma     Family History  Problem Relation Age of Onset  . Hypertension Mother   . Heart disease Mother   . Cancer Father 69    prostate  . Stroke Father   . Colon cancer Neg Hx    Social History   Social History  . Marital status: Single    Spouse name: N/A  . Number of children: 3  . Years of education: N/A   Social History Main Topics  . Smoking status: Former Smoker    Quit date: 10/05/2000  . Smokeless tobacco: Never Used  . Alcohol use 0.0 oz/week     Comment: occasional wine  . Drug use: No  . Sexual activity: Not Asked   Other Topics Concern  . None   Social History Narrative  . None    Outpatient Encounter Prescriptions as of 07/14/2016  Medication Sig  . aspirin EC 81 MG tablet Take 81 mg by mouth daily.  . fluticasone (FLONASE) 50 MCG/ACT nasal spray USE 2 SPRAYS IN EACH NOSTRIL DAILY  . Multiple Vitamins-Minerals (VISION-VITE PRESERVE PO) Take by mouth.  . pimecrolimus (ELIDEL) 1 % cream Apply topically 2 (two) times daily.  . timolol (TIMOPTIC) 0.5 % ophthalmic solution Place 1 drop  into both eyes daily.   No facility-administered encounter medications on file as of 07/14/2016.     Review of Systems  Constitutional: Negative for appetite change and unexpected weight change.  HENT: Negative for congestion and sinus pressure.   Respiratory: Negative for cough, chest tightness and shortness of breath.   Cardiovascular: Negative for chest pain, palpitations and leg swelling.  Gastrointestinal: Negative for abdominal pain, diarrhea, nausea and vomiting.  Genitourinary: Negative for difficulty urinating and dysuria.  Musculoskeletal: Negative for back pain and joint swelling.  Skin: Negative for color change and rash.  Neurological: Negative for dizziness, light-headedness and headaches.  Psychiatric/Behavioral: Negative for agitation and dysphoric mood.       Objective:    Physical Exam  Constitutional: He appears well-developed and well-nourished. No distress.  HENT:  Nose: Nose normal.  Mouth/Throat: Oropharynx is clear and moist.  Neck: Neck supple. No thyromegaly present.  Cardiovascular: Normal rate and regular rhythm.   Left carotid bruit.   Pulmonary/Chest: Effort normal and breath sounds normal. No respiratory distress.  Abdominal: Soft. Bowel sounds are normal. There is no tenderness.  Musculoskeletal: He exhibits no edema or tenderness.  Lymphadenopathy:    He has no cervical adenopathy.  Skin: No rash noted. No erythema.  Psychiatric: He has a normal mood and affect. His behavior is normal.    BP 102/66   Pulse 64   Temp 98.7 F (37.1 C) (Oral)   Wt 194 lb 1.9 oz (88.1 kg)   SpO2 94%   BMI 29.95 kg/m  Wt Readings from Last 3 Encounters:  07/14/16 194 lb 1.9 oz (88.1 kg)  12/11/15 197 lb 12 oz (89.7 kg)  06/19/15 195 lb 8 oz (88.7 kg)     Lab Results  Component Value Date   WBC 5.6 12/11/2015   HGB 16.1 12/11/2015   HCT 48.0 12/11/2015   PLT 161.0 12/11/2015   GLUCOSE 94 12/11/2015   CHOL 232 (H) 12/11/2015   TRIG 313.0 (H)  12/11/2015   HDL 34.70 (L) 12/11/2015   LDLDIRECT 141.0 12/11/2015   LDLCALC 133 (H) 04/12/2014   ALT 32 12/11/2015   AST 23 12/11/2015   NA 140 12/11/2015   K 4.7 12/11/2015   CL 103 12/11/2015   CREATININE 1.09 12/11/2015   BUN 20 12/11/2015   CO2 30 12/11/2015   TSH 2.76 12/11/2015   PSA 0.39 12/11/2015       Assessment & Plan:   Problem List Items Addressed This Visit    Hearing loss    S/p cochlear implant.  Doing well.        History of colonic polyps    Last colonoscopy 2012 with polyps removed.  Recommended f/u colonoscopy in 3-5 years.  Discussed with him today.  He wants to hold on colonoscopy.  Will notify me if changes his mind.        History of meningioma    Followed by neurosurgery.  S/p removal.  No f/u warranted.  Doing well.       Hypercholesterolemia    Low cholesterol diet and exercise.  Follow lipid panel.  Has desired not to take medication.        Relevant Orders   Basic metabolic panel   Hepatic function panel   Lipid panel   Thrombocytopenia ([place])    Most recent check wnl.  Follow.        Other Visit Diagnoses    Left carotid bruit    -  Primary   Relevant Orders   US Carotid Duplex Bilateral       Einar Pheasant, MD

## 2016-07-19 ENCOUNTER — Encounter: Payer: Self-pay | Admitting: Internal Medicine

## 2016-07-19 NOTE — Assessment & Plan Note (Signed)
Most recent check wnl.  Follow.    

## 2016-07-29 ENCOUNTER — Other Ambulatory Visit (INDEPENDENT_AMBULATORY_CARE_PROVIDER_SITE_OTHER): Payer: Medicare Other

## 2016-07-29 ENCOUNTER — Ambulatory Visit (INDEPENDENT_AMBULATORY_CARE_PROVIDER_SITE_OTHER): Payer: Medicare Other

## 2016-07-29 ENCOUNTER — Ambulatory Visit
Admission: RE | Admit: 2016-07-29 | Discharge: 2016-07-29 | Disposition: A | Discharge status: Home / Self Care | Payer: Medicare Other | Source: Ambulatory Visit | Attending: Internal Medicine | Admitting: Internal Medicine

## 2016-07-29 DIAGNOSIS — R0989 Other specified symptoms and signs involving the circulatory and respiratory systems: Secondary | ICD-10-CM | POA: Insufficient documentation

## 2016-07-29 DIAGNOSIS — E7800 Pure hypercholesterolemia, unspecified: Secondary | ICD-10-CM

## 2016-07-29 DIAGNOSIS — Z23 Encounter for immunization: Secondary | ICD-10-CM

## 2016-07-29 LAB — BASIC METABOLIC PANEL
BUN: 20 mg/dL (ref 6–23)
CALCIUM: 9.6 mg/dL (ref 8.4–10.5)
CHLORIDE: 103 meq/L (ref 96–112)
CO2: 30 meq/L (ref 19–32)
Creatinine, Ser: 0.99 mg/dL (ref 0.40–1.50)
GFR: 77.5 mL/min (ref >60.00)
Glucose, Bld: 92 mg/dL (ref 70–99)
Potassium: 4.2 mEq/L (ref 3.5–5.1)
SODIUM: 140 meq/L (ref 135–145)

## 2016-07-29 LAB — HEPATIC FUNCTION PANEL
ALK PHOS: 88 U/L (ref 39–117)
ALT: 31 U/L (ref 0–53)
AST: 20 U/L (ref 0–37)
Albumin: 4.4 g/dL (ref 3.5–5.2)
BILIRUBIN DIRECT: 0.1 mg/dL (ref 0.0–0.3)
BILIRUBIN TOTAL: 1 mg/dL (ref 0.2–1.2)
TOTAL PROTEIN: 6.6 g/dL (ref 6.0–8.3)

## 2016-07-29 LAB — LIPID PANEL
CHOL/HDL RATIO: 7
Cholesterol: 219 mg/dL — ABNORMAL HIGH (ref 0–200)
HDL: 33.6 mg/dL — AB (ref >39.00)
NONHDL: 185.4
Triglycerides: 333 mg/dL — ABNORMAL HIGH (ref 0.0–149.0)
VLDL: 66.6 mg/dL — ABNORMAL HIGH (ref 0.0–40.0)

## 2016-07-29 LAB — LDL CHOLESTEROL, DIRECT: LDL DIRECT: 145 mg/dL

## 2017-01-19 ENCOUNTER — Encounter: Payer: Self-pay | Admitting: Internal Medicine

## 2017-01-19 ENCOUNTER — Ambulatory Visit (INDEPENDENT_AMBULATORY_CARE_PROVIDER_SITE_OTHER): Payer: Medicare Other | Admitting: Internal Medicine

## 2017-01-19 ENCOUNTER — Telehealth: Payer: Self-pay | Admitting: Internal Medicine

## 2017-01-19 VITALS — BP 120/72 | HR 61 | Temp 98.5°F | Resp 12 | Ht 67.5 in | Wt 200.8 lb

## 2017-01-19 DIAGNOSIS — Z125 Encounter for screening for malignant neoplasm of prostate: Secondary | ICD-10-CM

## 2017-01-19 DIAGNOSIS — H9190 Unspecified hearing loss, unspecified ear: Secondary | ICD-10-CM

## 2017-01-19 DIAGNOSIS — D696 Thrombocytopenia, unspecified: Secondary | ICD-10-CM | POA: Diagnosis not present

## 2017-01-19 DIAGNOSIS — E7800 Pure hypercholesterolemia, unspecified: Secondary | ICD-10-CM | POA: Diagnosis not present

## 2017-01-19 DIAGNOSIS — Z0000 Encounter for general adult medical examination without abnormal findings: Secondary | ICD-10-CM

## 2017-01-19 LAB — BASIC METABOLIC PANEL
BUN: 15 mg/dL (ref 6–23)
CALCIUM: 9.3 mg/dL (ref 8.4–10.5)
CO2: 29 meq/L (ref 19–32)
Chloride: 103 mEq/L (ref 96–112)
Creatinine, Ser: 0.94 mg/dL (ref 0.40–1.50)
GFR: 82.18 mL/min (ref >60.00)
GLUCOSE: 92 mg/dL (ref 70–99)
Potassium: 4.3 mEq/L (ref 3.5–5.1)
SODIUM: 138 meq/L (ref 135–145)

## 2017-01-19 LAB — CBC WITH DIFFERENTIAL/PLATELET
BASOS PCT: 0.5 % (ref 0.0–3.0)
Basophils Absolute: 0 10*3/uL (ref 0.0–0.1)
EOS PCT: 3.1 % (ref 0.0–5.0)
Eosinophils Absolute: 0.1 10*3/uL (ref 0.0–0.7)
HEMATOCRIT: 47.4 % (ref 39.0–52.0)
Hemoglobin: 15.8 g/dL (ref 13.0–17.0)
LYMPHS ABS: 1.1 10*3/uL (ref 0.7–4.0)
LYMPHS PCT: 24.6 % (ref 12.0–46.0)
MCHC: 33.2 g/dL (ref 30.0–36.0)
MCV: 91.9 fl (ref 78.0–100.0)
Monocytes Absolute: 0.4 10*3/uL (ref 0.1–1.0)
Monocytes Relative: 8.3 % (ref 3.0–12.0)
NEUTROS ABS: 3 10*3/uL (ref 1.4–7.7)
NEUTROS PCT: 63.5 % (ref 43.0–77.0)
PLATELETS: 154 10*3/uL (ref 150.0–400.0)
RBC: 5.16 Mil/uL (ref 4.22–5.81)
RDW: 13.9 % (ref 11.5–15.5)
WBC: 4.7 10*3/uL (ref 4.0–10.5)

## 2017-01-19 LAB — HEPATIC FUNCTION PANEL
ALK PHOS: 92 U/L (ref 39–117)
ALT: 34 U/L (ref 0–53)
AST: 24 U/L (ref 0–37)
Albumin: 4.4 g/dL (ref 3.5–5.2)
BILIRUBIN TOTAL: 1.3 mg/dL — AB (ref 0.2–1.2)
Bilirubin, Direct: 0.2 mg/dL (ref 0.0–0.3)
TOTAL PROTEIN: 6.6 g/dL (ref 6.0–8.3)

## 2017-01-19 LAB — LIPID PANEL
CHOL/HDL RATIO: 7
Cholesterol: 227 mg/dL — ABNORMAL HIGH (ref 0–200)
HDL: 33.7 mg/dL — ABNORMAL LOW (ref >39.00)
NONHDL: 193.19
TRIGLYCERIDES: 300 mg/dL — AB (ref 0.0–149.0)
VLDL: 60 mg/dL — ABNORMAL HIGH (ref 0.0–40.0)

## 2017-01-19 LAB — PSA, MEDICARE: PSA: 0.37 ng/ml (ref 0.10–4.00)

## 2017-01-19 LAB — TSH: TSH: 2.51 u[IU]/mL (ref 0.35–4.50)

## 2017-01-19 LAB — LDL CHOLESTEROL, DIRECT: Direct LDL: 141 mg/dL

## 2017-01-19 MED ORDER — METRONIDAZOLE 1 % EX GEL: Freq: Every day | CUTANEOUS | 0 refills | Status: DC

## 2017-01-19 NOTE — Assessment & Plan Note (Signed)
Low cholesterol diet and exercise.  Follow lipid panel.   

## 2017-01-19 NOTE — Telephone Encounter (Signed)
Please advise 

## 2017-01-19 NOTE — Assessment & Plan Note (Signed)
Last check wnl.  Recheck cbc today.

## 2017-01-19 NOTE — Progress Notes (Signed)
Pre-visit discussion using our clinic review tool. No additional management support is needed unless otherwise documented below in the visit note.  

## 2017-01-19 NOTE — Assessment & Plan Note (Signed)
s/p cochlear implant.  Has f/u next week. Doing well.

## 2017-01-19 NOTE — Progress Notes (Signed)
Patient ID: THOMSON HERBERS, male   DOB: [birth year], 82 y.o.   MRN: [id number]   Subjective:    Patient ID: DELTA DESHMUKH, male    DOB: [birth year], 82 y.o.   MRN: [id number]  HPI  Patient here for a physical exam.  Doing well.  Stays active.  Has not been as active over the winter.  Gained some weight.  No chest pain.  No sob.  No acid reflux.  No abdominal pain.  Bowels moving.  Overall feels good.     Past Medical History:  Diagnosis Date  . Acoustic neuroma ([place])   . History of colonic polyps   . History of craniotomy   . Hypercholesterolemia   . Macular degeneration   . Psoriasis   . Rheumatic fever age 23  . Rosacea   . Scarlet fever age 31   Past Surgical History:  Procedure Laterality Date  . APPENDECTOMY  1959  . BUNIONECTOMY    . COCHLEAR IMPLANT     acute hearing loss  . CRANIECTOMY FOR EXCISION OF ACOUSTIC NEUROMA  1994  . craniotomy for meningoma     Family History  Problem Relation Age of Onset  . Hypertension Mother   . Heart disease Mother   . Cancer Father 71    prostate  . Stroke Father   . Colon cancer Neg Hx    Social History   Social History  . Marital status: Single    Spouse name: N/A  . Number of children: 3  . Years of education: N/A   Social History Main Topics  . Smoking status: Former Smoker    Quit date: 10/05/2000  . Smokeless tobacco: Never Used  . Alcohol use 0.0 oz/week     Comment: occasional wine  . Drug use: No  . Sexual activity: Not Asked   Other Topics Concern  . None   Social History Narrative  . None    Outpatient Encounter Prescriptions as of 01/19/2017  Medication Sig  . aspirin EC 81 MG tablet Take 81 mg by mouth daily.  . fluticasone (FLONASE) 50 MCG/ACT nasal spray USE 2 SPRAYS IN EACH NOSTRIL DAILY  . metroNIDAZOLE (METROGEL) 1 % gel Apply topically daily. Apply to face daily - with flares  . Multiple Vitamins-Minerals (VISION-VITE PRESERVE PO) Take by mouth.  . pimecrolimus (ELIDEL) 1 % cream Apply  topically 2 (two) times daily.  . timolol (TIMOPTIC) 0.5 % ophthalmic solution Place 1 drop into both eyes daily.   No facility-administered encounter medications on file as of 01/19/2017.     Review of Systems  Constitutional: Negative for appetite change.       Gained some weight over the winter.  Starting to be more active now.   HENT: Negative for congestion and sinus pressure.   Eyes: Negative for pain and visual disturbance.  Respiratory: Negative for cough, chest tightness and shortness of breath.   Cardiovascular: Negative for chest pain, palpitations and leg swelling.  Gastrointestinal: Negative for abdominal pain, diarrhea, nausea and vomiting.  Genitourinary: Negative for difficulty urinating and frequency.  Musculoskeletal: Negative for back pain and joint swelling.  Skin: Negative for color change and rash.  Neurological: Negative for dizziness and headaches.  Hematological: Negative for adenopathy. Does not bruise/bleed easily.  Psychiatric/Behavioral: Negative for agitation and dysphoric mood.       Objective:     Blood pressure rechecked by me:  128/82  Physical Exam  Constitutional: He is oriented to person, place, and time.  He appears well-developed and well-nourished. No distress.  HENT:  Head: Normocephalic and atraumatic.  Nose: Nose normal.  Mouth/Throat: Oropharynx is clear and moist. No oropharyngeal exudate.  Eyes: Conjunctivae are normal. Right eye exhibits no discharge. Left eye exhibits no discharge.  Neck: Neck supple. No thyromegaly present.  Cardiovascular: Normal rate and regular rhythm.   Pulmonary/Chest: Breath sounds normal. No respiratory distress. He has no wheezes.  Abdominal: Soft. Bowel sounds are normal. There is no tenderness.  Genitourinary:  Genitourinary Comments: Rectal exam - heme negative.   Musculoskeletal: He exhibits no edema or tenderness.  Lymphadenopathy:    He has no cervical adenopathy.  Neurological: He is alert and  oriented to person, place, and time.  Skin: Skin is warm and dry. No erythema.  Rosacea - face.   Psychiatric: He has a normal mood and affect. His behavior is normal.    BP 120/72 (BP Location: Left Arm, Patient Position: Sitting, Cuff Size: Normal)   Pulse 61   Temp 98.5 F (36.9 C) (Oral)   Resp 12   Ht 5' 7.5" (1.715 m)   Wt 200 lb 12.8 oz (91.1 kg)   SpO2 96%   BMI 30.99 kg/m  Wt Readings from Last 3 Encounters:  01/19/17 200 lb 12.8 oz (91.1 kg)  07/14/16 194 lb 1.9 oz (88.1 kg)  12/11/15 197 lb 12 oz (89.7 kg)     Lab Results  Component Value Date   WBC 4.7 01/19/2017   HGB 15.8 01/19/2017   HCT 47.4 01/19/2017   PLT 154.0 01/19/2017   GLUCOSE 92 01/19/2017   CHOL 227 (H) 01/19/2017   TRIG 300.0 (H) 01/19/2017   HDL 33.70 (L) 01/19/2017   LDLDIRECT 141.0 01/19/2017   LDLCALC 133 (H) 04/12/2014   ALT 34 01/19/2017   AST 24 01/19/2017   NA 138 01/19/2017   K 4.3 01/19/2017   CL 103 01/19/2017   CREATININE 0.94 01/19/2017   BUN 15 01/19/2017   CO2 29 01/19/2017   TSH 2.51 01/19/2017   PSA 0.37 01/19/2017    US Carotid Duplex Bilateral  Result Date: 07/29/2016 CLINICAL DATA:  Carotid bruit. EXAM: BILATERAL CAROTID DUPLEX ULTRASOUND TECHNIQUE: Pearline Cables scale imaging, color Doppler and duplex ultrasound were performed of bilateral carotid and vertebral arteries in the neck. COMPARISON:  02/09/2012. FINDINGS: Criteria: Quantification of carotid stenosis is based on velocity parameters that correlate the residual internal carotid diameter with NASCET-based stenosis levels, using the diameter of the distal internal carotid lumen as the denominator for stenosis measurement. The following velocity measurements were obtained: RIGHT ICA:  92/20 cm/sec CCA:  536/1 2 cm/sec SYSTOLIC ICA/CCA RATIO:  0.8 DIASTOLIC ICA/CCA RATIO:  0.8 ECA:  105 cm/sec LEFT ICA:  75/25 cm/sec CCA:  443/15 cm/sec SYSTOLIC ICA/CCA RATIO:  0 point set DIASTOLIC ICA/CCA RATIO:  1.0 ECA:  112 cm/sec  RIGHT CAROTID ARTERY: Mild right carotid bifurcation/proximal ICA atherosclerotic vascular plaque. No flow limiting stenosis. RIGHT VERTEBRAL ARTERY:  Patent with antegrade flow. LEFT CAROTID ARTERY: Mild left carotid bifurcation/proximal ICA atherosclerotic vascular plaque. No flow limiting stenosis. LEFT VERTEBRAL ARTERY:  Patent antegrade flow. IMPRESSION: 1. Mild bilateral carotid bifurcation atherosclerotic vascular plaque. No flow limiting stenosis. Degree of stenosis less than 50% bilaterally. 2. Vertebral arteries are patent antegrade flow. Electronically Signed   By: Marcello Moores  Register   On: 07/29/2016 14:12       Assessment & Plan:   Problem List Items Addressed This Visit    Health care maintenance    Physical  today 01/19/17.  Colonoscopy 07/23/11 with polyps.  Recommended f/u in 3-5 years.  Had wanted to hold off previously.  Discussed with him today.  He continues to decline colonoscopy, IFOB, etc.  States he desires no further evaluation.  Check psa today.       Hearing loss    s/p cochlear implant.  Has f/u next week. Doing well.        Hypercholesterolemia    Low cholesterol diet and exercise.  Follow lipid panel.        Relevant Orders   Lipid panel (Completed)   Hepatic function panel (Completed)   TSH (Completed)   Basic metabolic panel (Completed)   Thrombocytopenia ([facility])    Last check wnl.  Recheck cbc today.       Relevant Orders   CBC with Differential/Platelet (Completed)    Other Visit Diagnoses    Prostate cancer screening    -  Primary   Relevant Orders   PSA, Medicare (Completed)       Einar Pheasant, MD

## 2017-01-19 NOTE — Assessment & Plan Note (Signed)
Physical today 01/19/17.  Colonoscopy 07/23/11 with polyps.  Recommended f/u in 3-5 years.  Had wanted to hold off previously.  Discussed with him today.  He continues to decline colonoscopy, IFOB, etc.  States he desires no further evaluation.  Check psa today.

## 2017-01-19 NOTE — Telephone Encounter (Signed)
Pharmacy called and stated that the rx for metroNIDAZOLE (METROGEL) 1 % gel does not come in 45 grams, it comes in 60 grams. They need an ok to make the change. Please advise, thank you!  [place], [place]

## 2017-01-19 NOTE — Telephone Encounter (Signed)
Ok to change to 60g

## 2017-01-19 NOTE — Telephone Encounter (Signed)
Called pharmacy informed ok to change

## 2017-01-22 ENCOUNTER — Other Ambulatory Visit: Payer: Self-pay | Admitting: Internal Medicine

## 2017-01-22 NOTE — Progress Notes (Signed)
Order placed for f/u liver panel.  

## 2017-01-24 ENCOUNTER — Encounter: Payer: Self-pay | Admitting: Internal Medicine

## 2017-01-24 DIAGNOSIS — R21 Rash and other nonspecific skin eruption: Secondary | ICD-10-CM

## 2017-01-25 ENCOUNTER — Encounter: Payer: Self-pay | Admitting: Internal Medicine

## 2017-01-25 NOTE — Telephone Encounter (Signed)
Please let pt know that I did not get his message until today.  Please confirm he is doing ok now.  Confirm no sob, lip or tongue swelling.  Any rash now?  Would not use the metronidazole again.  Please add to allergy list.  If persistent problems with his nose and given his reaction to this medication, would recommend f/u with dermatology.  Thanks

## 2017-01-25 NOTE — Telephone Encounter (Signed)
Spoke with patient advised didn't get message until today.  Denies shortness of breath , lip tongue, swelling, no rash.  He states he continues to itch has been taking Benadyl as he did when first got rash.   Would like to be referred to [facility] in [place].  Advised patient if reaction happens again needs to seek treatment immediately

## 2017-01-26 NOTE — Telephone Encounter (Signed)
Order placed for dermatology referral.  

## 2017-02-01 ENCOUNTER — Telehealth: Payer: Self-pay | Admitting: Internal Medicine

## 2017-02-01 NOTE — Telephone Encounter (Signed)
Left pt message asking to call Allison back directly at [phone] to schedule AWV. Thanks! °

## 2017-02-10 ENCOUNTER — Encounter: Payer: Self-pay | Admitting: Internal Medicine

## 2017-02-11 ENCOUNTER — Other Ambulatory Visit (INDEPENDENT_AMBULATORY_CARE_PROVIDER_SITE_OTHER): Payer: Medicare Other

## 2017-02-11 LAB — HEPATIC FUNCTION PANEL
ALK PHOS: 95 U/L (ref 39–117)
ALT: 33 U/L (ref 0–53)
AST: 25 U/L (ref 0–37)
Albumin: 4.5 g/dL (ref 3.5–5.2)
BILIRUBIN TOTAL: 1.1 mg/dL (ref 0.2–1.2)
Bilirubin, Direct: 0.1 mg/dL (ref 0.0–0.3)
Total Protein: 7 g/dL (ref 6.0–8.3)

## 2017-02-11 NOTE — Telephone Encounter (Signed)
Please go ahead and add to allergy list.

## 2017-02-12 ENCOUNTER — Encounter: Payer: Self-pay | Admitting: Internal Medicine

## 2017-03-04 NOTE — Telephone Encounter (Signed)
Left pt message asking to call Allison back directly at [phone] to schedule AWV. Thanks! °

## 2017-04-18 ENCOUNTER — Encounter: Payer: Self-pay | Admitting: Internal Medicine

## 2017-07-08 ENCOUNTER — Ambulatory Visit (INDEPENDENT_AMBULATORY_CARE_PROVIDER_SITE_OTHER): Payer: Medicare Other

## 2017-07-08 VITALS — BP 120/72 | HR 95 | Temp 98.3°F | Resp 14 | Ht 67.5 in | Wt 197.8 lb

## 2017-07-08 DIAGNOSIS — Z0000 Encounter for general adult medical examination without abnormal findings: Secondary | ICD-10-CM

## 2017-07-08 NOTE — Patient Instructions (Addendum)
  Mr. Klas , Thank you for taking time to come for your Medicare Wellness Visit. I appreciate your ongoing commitment to your health goals. Please review the following plan we discussed and let me know if I can assist you in the future.   Follow up with Dr. Nicki Reaper as needed.    Bring a copy of your [place] and/or Living Will to be scanned into chart.  Have a great day!  These are the goals we discussed: Goals    . Healthy Lifestyle          Stay hydrated Stay active Low carb foods       This is a list of the screening recommended for you and due dates:  Health Maintenance  Topic Date Due  . Tetanus Vaccine  07/20/1956  . Flu Shot  01/02/2018*  . Pneumonia vaccines  Completed  *Topic was postponed. The date shown is not the original due date.

## 2017-07-08 NOTE — Progress Notes (Signed)
Subjective:   Benjamin Carney is a 82 y.o. male who presents for an Initial Medicare Annual Wellness Visit.  Review of Systems  No ROS.  Medicare Wellness Visit. Additional risk factors are reflected in the social history.  Cardiac Risk Factors include: advanced age (>78men, >30 women);male gender;obesity (BMI >30kg/m2)    Objective:    Today's Vitals   07/08/17 1629  BP: 120/72  Pulse: 95  Resp: 14  Temp: 98.3 F (36.8 C)  TempSrc: Oral  SpO2: 95%  Weight: 197 lb 12.8 oz (89.7 kg)  Height: 5' 7.5" (1.715 m)   Body mass index is 30.52 kg/m.  Current Medications (verified) Outpatient Encounter Prescriptions as of 07/08/2017  Medication Sig  . aspirin EC 81 MG tablet Take 81 mg by mouth daily.  . fluticasone (FLONASE) 50 MCG/ACT nasal spray USE 2 SPRAYS IN EACH NOSTRIL DAILY  . Multiple Vitamins-Minerals (VISION-VITE PRESERVE PO) Take by mouth.  . timolol (TIMOPTIC) 0.5 % ophthalmic solution Place 1 drop into both eyes daily.  . [DISCONTINUED] pimecrolimus (ELIDEL) 1 % cream Apply topically 2 (two) times daily.   No facility-administered encounter medications on file as of 07/08/2017.     Allergies (verified) Metronidazole   History: Past Medical History:  Diagnosis Date  . Acoustic neuroma ([place])   . History of colonic polyps   . History of craniotomy   . Hypercholesterolemia   . Macular degeneration   . Psoriasis   . Rheumatic fever age 20  . Rosacea   . Scarlet fever age 80   Past Surgical History:  Procedure Laterality Date  . APPENDECTOMY  1959  . BUNIONECTOMY    . COCHLEAR IMPLANT     acute hearing loss  . CRANIECTOMY FOR EXCISION OF ACOUSTIC NEUROMA  1994  . craniotomy for meningoma     Family History  Problem Relation Age of Onset  . Hypertension Mother   . Heart disease Mother   . Cancer Father 11       prostate  . Stroke Father   . Colon cancer Neg Hx    Social History   Occupational History  . Not on file.   Social History Main  Topics  . Smoking status: Former Smoker    Quit date: 10/05/2000  . Smokeless tobacco: Never Used  . Alcohol use 0.0 oz/week     Comment: occasional wine  . Drug use: No  . Sexual activity: Not on file   Tobacco Counseling Counseling given: Not Answered   Activities of Daily Living In your present state of health, do you have any difficulty performing the following activities: 07/08/2017  Hearing? Y  Vision? N  Difficulty concentrating or making decisions? N  Walking or climbing stairs? N  Dressing or bathing? N  Doing errands, shopping? N  Preparing Food and eating ? N  Using the Toilet? N  In the past six months, have you accidently leaked urine? N  Do you have problems with loss of bowel control? N  Managing your Medications? N  Managing your Finances? N  Housekeeping or managing your Housekeeping? N  Some recent data might be hidden    Immunizations and Health Maintenance Immunization History  Administered Date(s) Administered  . Influenza Split 09/11/2011, 08/15/2012  . Influenza,inj,Quad PF,6+ Mos 08/25/2013, 10/10/2014, 09/03/2015, 07/29/2016  . Pneumococcal Conjugate-13 12/07/2013  . Pneumococcal Polysaccharide-23 06/05/2009  . Zoster 09/22/2010   Health Maintenance Due  Topic Date Due  . Samul Dada  07/20/1954    Patient Care Team:  Einar Pheasant, MD as PCP - General (Internal Medicine)  Indicate any recent Medical Services you may have received from other than [facility] providers in the past year (date may be approximate).    Assessment:   This is a routine wellness examination for Benjamin Carney. The goal of the wellness visit is to assist the patient how to close the gaps in care and create a preventative care plan for the patient.   The roster of all physicians providing medical care to patient is listed in the Snapshot section of the chart.  Osteoporosis risk reviewed.    Safety issues reviewed; Smoke and carbon monoxide detectors in the home. No firearms  in the home.  Wears seatbelts when driving or riding with others. Patient does wear sunscreen or protective clothing when in direct sunlight. No violence in the home.  Patient is alert, normal appearance, oriented to person/place/and time.  Correctly identified the president of the [place], recall of 3/3 words, and performing simple calculations. Displays appropriate judgement and can read correct time from watch face.   No new identified risk were noted.  No failures at ADL's or IADL's.    BMI- discussed the importance of a healthy diet, water intake and the benefits of aerobic exercise. Educational material provided.   24 hour diet recall: Breakfast: cereal, fruit Lunch: grilled cheese sandwich, soup Dinner: bbq, cornbread  Daily fluid intake: 1 cups of caffeine,  2 cups of water  Dental- every 4 months.  [facility].  Eye- Visual acuity not assessed per patient preference since they have regular follow up with the ophthalmologist.  Wears corrective lenses.  Sleep patterns- Sleeps 5-6 hours at night.  Wakes feeling rested.  Naps during the day.  TDAP vaccine deferred per patient preference.  Follow up with insurance.  Educational material provided.  Patient Concerns: He would like to start Betamethasone/Dipropionate for roscae.  Deferred to PCP for follow up at upcoming visit.  Hearing/Vision screen Hearing Screening Comments: Followed by [facility] Visits every 12 months  Cochlear implants  Vision Screening Comments: Followed by [facility] Wears corrective lenses Visits every 6 months. Last OV 03/2017 Cataract extraction, R eye Macular degeneration Visual acuity not assessed per patient preference since they have regular follow up with the ophthalmologist  Dietary issues and exercise activities discussed: Current Exercise Habits: Structured exercise class, Type of exercise: walking (golf), Time (Minutes): 20, Frequency (Times/Week): 4, Weekly Exercise (Minutes/Week): 80, Intensity:  Mild  Goals    . Healthy Lifestyle          Stay hydrated Stay active Low carb foods      Depression Screen PHQ 2/9 Scores 07/08/2017 07/14/2016 12/17/2014 12/07/2013  PHQ - 2 Score 0 0 0 0    Fall Risk Fall Risk  07/08/2017 07/14/2016 12/17/2014 12/07/2013 10/30/2012  Falls in the past year? No No No No No    Cognitive Function:     6CIT Screen 07/08/2017  What Year? 0 points  What month? 0 points  What time? 0 points  Count back from 20 0 points  Months in reverse 0 points  Repeat phrase 0 points  Total Score 0    Screening Tests Health Maintenance  Topic Date Due  . TETANUS/TDAP  07[birth year]20/1954  . INFLUENZA VACCINE  01/02/2018 (Originally 05/05/2017)  . PNA vac Low Risk Adult  Completed        Plan:    End of life planning; Advance aging; Advanced directives discussed. Copy of current HCPOA/Living Will requested.  I have personally reviewed and noted the following in the patient's chart:   . Medical and social history . Use of alcohol, tobacco or illicit drugs  . Current medications and supplements . Functional ability and status . Nutritional status . Physical activity . Advanced directives . List of other physicians . Hospitalizations, surgeries, and ER visits in previous 12 months . Vitals . Screenings to include cognitive, depression, and falls . Referrals and appointments  In addition, I have reviewed and discussed with patient certain preventive protocols, quality metrics, and best practice recommendations. A written personalized care plan for preventive services as well as general preventive health recommendations were provided to patient.     OBrien-Blaney, Cutberto Winfree L, LPN   30/0/5110    Reviewed above information.  Agree with assessment and plan.  Will discuss further treatment of rosacea at his f/u appt.    Dr Nicki Reaper

## 2017-07-21 ENCOUNTER — Encounter: Payer: Self-pay | Admitting: Internal Medicine

## 2017-07-21 ENCOUNTER — Ambulatory Visit (INDEPENDENT_AMBULATORY_CARE_PROVIDER_SITE_OTHER): Payer: Medicare Other | Admitting: Internal Medicine

## 2017-07-21 VITALS — BP 122/74 | HR 68 | Temp 98.6°F | Resp 14 | Ht 68.0 in | Wt 196.8 lb

## 2017-07-21 DIAGNOSIS — Z86010 Personal history of colonic polyps: Secondary | ICD-10-CM

## 2017-07-21 DIAGNOSIS — H9190 Unspecified hearing loss, unspecified ear: Secondary | ICD-10-CM

## 2017-07-21 DIAGNOSIS — Z86018 Personal history of other benign neoplasm: Secondary | ICD-10-CM

## 2017-07-21 DIAGNOSIS — E7800 Pure hypercholesterolemia, unspecified: Secondary | ICD-10-CM | POA: Diagnosis not present

## 2017-07-21 DIAGNOSIS — Z23 Encounter for immunization: Secondary | ICD-10-CM | POA: Diagnosis not present

## 2017-07-21 DIAGNOSIS — D696 Thrombocytopenia, unspecified: Secondary | ICD-10-CM

## 2017-07-21 LAB — BASIC METABOLIC PANEL
BUN: 16 mg/dL (ref 6–23)
CHLORIDE: 102 meq/L (ref 96–112)
CO2: 26 meq/L (ref 19–32)
Calcium: 9.5 mg/dL (ref 8.4–10.5)
Creatinine, Ser: 0.95 mg/dL (ref 0.40–1.50)
GFR: 81.08 mL/min (ref >60.00)
GLUCOSE: 92 mg/dL (ref 70–99)
Potassium: 4.3 mEq/L (ref 3.5–5.1)
SODIUM: 138 meq/L (ref 135–145)

## 2017-07-21 LAB — HEPATIC FUNCTION PANEL
ALT: 37 U/L (ref 0–53)
AST: 27 U/L (ref 0–37)
Albumin: 4.2 g/dL (ref 3.5–5.2)
Alkaline Phosphatase: 83 U/L (ref 39–117)
Bilirubin, Direct: 0.2 mg/dL (ref 0.0–0.3)
Total Bilirubin: 1 mg/dL (ref 0.2–1.2)
Total Protein: 6.9 g/dL (ref 6.0–8.3)

## 2017-07-21 LAB — LIPID PANEL
Cholesterol: 229 mg/dL — ABNORMAL HIGH (ref 0–200)
HDL: 31.9 mg/dL — AB (ref >39.00)
NonHDL: 196.89
TRIGLYCERIDES: 311 mg/dL — AB (ref 0.0–149.0)
Total CHOL/HDL Ratio: 7
VLDL: 62.2 mg/dL — AB (ref 0.0–40.0)

## 2017-07-21 LAB — LDL CHOLESTEROL, DIRECT: Direct LDL: 148 mg/dL

## 2017-07-21 NOTE — Progress Notes (Signed)
[name], male   DOB: 06-Jun-1935, 82 y.o.   MRN: [id number]   Subjective:    [name], male    DOB: Jul 09, 1935, 83 y.o.   MRN: [id number]  HPI  Patient here for a scheduled follow up.  States he is doing well.  No significant sinus or allergy symptoms.  Minimal drainage today.  No sore throat.  No fever.  No acid reflux.  No abdominal pain.  Bowels moving.  No urine change.  No blood.  Overall feels good and feels he is doing well.  Playing golf.     Past Medical History:  Diagnosis Date  . Acoustic neuroma ([place])   . History of colonic polyps   . History of craniotomy   . Hypercholesterolemia   . Macular degeneration   . Psoriasis   . Rheumatic fever age 34  . Rosacea   . Scarlet fever age 59   Past Surgical History:  Procedure Laterality Date  . APPENDECTOMY  1959  . BUNIONECTOMY    . COCHLEAR IMPLANT     acute hearing loss  . CRANIECTOMY FOR EXCISION OF ACOUSTIC NEUROMA  1994  . craniotomy for meningoma     Family History  Problem Relation Age of Onset  . Hypertension Mother   . Heart disease Mother   . Cancer Father 52       prostate  . Stroke Father   . Colon cancer Neg Hx    Social History   Social History  . Marital status: Single    Spouse name: N/A  . Number of children: 3  . Years of education: N/A   Social History Main Topics  . Smoking status: Former Smoker    Quit date: 10/05/2000  . Smokeless tobacco: Never Used  . Alcohol use 0.0 oz/week     Comment: occasional wine  . Drug use: No  . Sexual activity: Not Asked   Other Topics Concern  . None   Social History Narrative  . None    Outpatient Encounter Prescriptions as of 07/21/2017  Medication Sig  . aspirin EC 81 MG tablet Take 81 mg by mouth daily.  . fluticasone (FLONASE) 50 MCG/ACT nasal spray USE 2 SPRAYS IN EACH NOSTRIL DAILY  . Multiple Vitamins-Minerals (VISION-VITE PRESERVE PO) Take by mouth.  . timolol (TIMOPTIC) 0.5 % ophthalmic solution  Place 1 drop into both eyes daily.   No facility-administered encounter medications on file as of 07/21/2017.     Review of Systems  Constitutional: Negative for appetite change and unexpected weight change.  HENT: Negative for congestion and sinus pressure.   Respiratory: Negative for cough, chest tightness and shortness of breath.   Cardiovascular: Negative for chest pain, palpitations and leg swelling.  Gastrointestinal: Negative for abdominal pain, diarrhea, nausea and vomiting.  Genitourinary: Negative for difficulty urinating and dysuria.  Musculoskeletal: Negative for joint swelling and myalgias.  Skin: Negative for color change and rash.  Neurological: Negative for dizziness, light-headedness and headaches.  Psychiatric/Behavioral: Negative for agitation and dysphoric mood.       Objective:    Physical Exam  Constitutional: He appears well-developed and well-nourished. No distress.  HENT:  Nose: Nose normal.  Mouth/Throat: Oropharynx is clear and moist.  Neck: Neck supple.  Cardiovascular: Normal rate and regular rhythm.   Pulmonary/Chest: Effort normal and breath sounds normal. No respiratory distress.  Abdominal: Soft. Bowel sounds are normal. There is no tenderness.  Musculoskeletal: He exhibits no edema.  Lymphadenopathy:  He has no cervical adenopathy.  Skin: No rash noted. No erythema.  Psychiatric: He has a normal mood and affect. His behavior is normal.    BP 122/74 (BP Location: Left Arm, Patient Position: Sitting, Cuff Size: Normal)   Pulse 68   Temp 98.6 F (37 C) (Oral)   Resp 14   Ht 5\' 8"  (1.727 m)   Wt 196 lb 12.8 oz (89.3 kg)   SpO2 95%   BMI 29.92 kg/m  Wt Readings from Last 3 Encounters:  07/21/17 196 lb 12.8 oz (89.3 kg)  07/08/17 197 lb 12.8 oz (89.7 kg)  01/19/17 200 lb 12.8 oz (91.1 kg)     Lab Results  Component Value Date   WBC 4.7 01/19/2017   HGB 15.8 01/19/2017   HCT 47.4 01/19/2017   PLT 154.0 01/19/2017   GLUCOSE 92  07/21/2017   CHOL 229 (H) 07/21/2017   TRIG 311.0 (H) 07/21/2017   HDL 31.90 (L) 07/21/2017   LDLDIRECT 148.0 07/21/2017   LDLCALC 133 (H) 04/12/2014   ALT 37 07/21/2017   AST 27 07/21/2017   NA 138 07/21/2017   K 4.3 07/21/2017   CL 102 07/21/2017   CREATININE 0.95 07/21/2017   BUN 16 07/21/2017   CO2 26 07/21/2017   TSH 2.51 01/19/2017   PSA 0.37 01/19/2017    US Carotid Duplex Bilateral  Result Date: 07/29/2016 CLINICAL DATA:  Carotid bruit. EXAM: BILATERAL CAROTID DUPLEX ULTRASOUND TECHNIQUE: Pearline Cables scale imaging, color Doppler and duplex ultrasound were performed of bilateral carotid and vertebral arteries in the neck. COMPARISON:  02/09/2012. FINDINGS: Criteria: Quantification of carotid stenosis is based on velocity parameters that correlate the residual internal carotid diameter with NASCET-based stenosis levels, using the diameter of the distal internal carotid lumen as the denominator for stenosis measurement. The following velocity measurements were obtained: RIGHT ICA:  92/20 cm/sec CCA:  627/0 2 cm/sec SYSTOLIC ICA/CCA RATIO:  0.8 DIASTOLIC ICA/CCA RATIO:  0.8 ECA:  105 cm/sec LEFT ICA:  75/25 cm/sec CCA:  350/09 cm/sec SYSTOLIC ICA/CCA RATIO:  0 point set DIASTOLIC ICA/CCA RATIO:  1.0 ECA:  112 cm/sec RIGHT CAROTID ARTERY: Mild right carotid bifurcation/proximal ICA atherosclerotic vascular plaque. No flow limiting stenosis. RIGHT VERTEBRAL ARTERY:  Patent with antegrade flow. LEFT CAROTID ARTERY: Mild left carotid bifurcation/proximal ICA atherosclerotic vascular plaque. No flow limiting stenosis. LEFT VERTEBRAL ARTERY:  Patent antegrade flow. IMPRESSION: 1. Mild bilateral carotid bifurcation atherosclerotic vascular plaque. No flow limiting stenosis. Degree of stenosis less than 50% bilaterally. 2. Vertebral arteries are patent antegrade flow. Electronically Signed   By: Marcello Moores  Register   On: 07/29/2016 14:12       Assessment & Plan:   Problem List Items Addressed This Visit     Hearing loss    S/p cochlear implant.  Doing well.        History of colonic polyps    Last colonoscopy 2012 with polyp removed.  They had recommended f/u in 3-5 years.  I again discussed with him regarding f/u colonoscopy.  He declines.  Will notify me if he changes his mind.        History of meningioma    S/p removal.  Was followed by neurosurgery.  No f/u warranted.  Doing well.       Hypercholesterolemia - Primary    Low cholesterol diet and exercise.  Follow lipid panel.        Relevant Orders   Lipid panel (Completed)   Hepatic function panel (Completed)   Basic  metabolic panel (Completed)   Thrombocytopenia ([facility])    Last platelet count 01/2017 wnl.  Follow.        Other Visit Diagnoses    Encounter for immunization       Relevant Orders   Flu vaccine HIGH DOSE PF (Completed)       Einar Pheasant, MD

## 2017-07-24 ENCOUNTER — Encounter: Payer: Self-pay | Admitting: Internal Medicine

## 2017-07-24 NOTE — Assessment & Plan Note (Signed)
Last colonoscopy 2012 with polyp removed.  They had recommended f/u in 3-5 years.  I again discussed with him regarding f/u colonoscopy.  He declines.  Will notify me if he changes his mind.

## 2017-07-24 NOTE — Assessment & Plan Note (Signed)
Last platelet count 01/2017 wnl.  Follow.

## 2017-07-24 NOTE — Assessment & Plan Note (Signed)
Low cholesterol diet and exercise.  Follow lipid panel.   

## 2017-07-24 NOTE — Assessment & Plan Note (Signed)
S/p removal.  Was followed by neurosurgery.  No f/u warranted.  Doing well.  

## 2017-07-24 NOTE — Assessment & Plan Note (Signed)
S/p cochlear implant.  Doing well.

## 2017-07-27 ENCOUNTER — Telehealth: Payer: Self-pay | Admitting: Internal Medicine

## 2017-07-27 NOTE — Telephone Encounter (Signed)
Pt lvm stating that he is rtc to Dr. Nicki Reaper- Joellen lvm to call her back regarding labs. Pt cb [phone]

## 2017-07-27 NOTE — Telephone Encounter (Signed)
Left message to return call 

## 2017-07-29 NOTE — Telephone Encounter (Signed)
Left message to return call to our office. Need to go over lab results.

## 2017-07-30 NOTE — Telephone Encounter (Signed)
Patient is aware of results. [facility] lipid diet in mail.

## 2017-08-06 ENCOUNTER — Other Ambulatory Visit: Payer: Self-pay | Admitting: Internal Medicine

## 2018-01-20 ENCOUNTER — Ambulatory Visit (INDEPENDENT_AMBULATORY_CARE_PROVIDER_SITE_OTHER): Payer: Medicare Other | Admitting: Internal Medicine

## 2018-01-20 ENCOUNTER — Encounter: Payer: Self-pay | Admitting: Internal Medicine

## 2018-01-20 ENCOUNTER — Ambulatory Visit (INDEPENDENT_AMBULATORY_CARE_PROVIDER_SITE_OTHER): Payer: Medicare Other

## 2018-01-20 VITALS — BP 126/72 | HR 64 | Temp 97.7°F | Resp 18 | Wt 195.0 lb

## 2018-01-20 DIAGNOSIS — Z0001 Encounter for general adult medical examination with abnormal findings: Secondary | ICD-10-CM

## 2018-01-20 DIAGNOSIS — Z125 Encounter for screening for malignant neoplasm of prostate: Secondary | ICD-10-CM

## 2018-01-20 DIAGNOSIS — D696 Thrombocytopenia, unspecified: Secondary | ICD-10-CM | POA: Diagnosis not present

## 2018-01-20 DIAGNOSIS — H9190 Unspecified hearing loss, unspecified ear: Secondary | ICD-10-CM

## 2018-01-20 DIAGNOSIS — Z0000 Encounter for general adult medical examination without abnormal findings: Secondary | ICD-10-CM

## 2018-01-20 DIAGNOSIS — R0602 Shortness of breath: Secondary | ICD-10-CM | POA: Diagnosis not present

## 2018-01-20 DIAGNOSIS — E7800 Pure hypercholesterolemia, unspecified: Secondary | ICD-10-CM

## 2018-01-20 LAB — HEPATIC FUNCTION PANEL
ALT: 33 U/L (ref 0–53)
AST: 25 U/L (ref 0–37)
Albumin: 4.4 g/dL (ref 3.5–5.2)
Alkaline Phosphatase: 95 U/L (ref 39–117)
BILIRUBIN TOTAL: 1.1 mg/dL (ref 0.2–1.2)
Bilirubin, Direct: 0.2 mg/dL (ref 0.0–0.3)
Total Protein: 6.4 g/dL (ref 6.0–8.3)

## 2018-01-20 LAB — PSA, MEDICARE: PSA: 0.39 ng/mL (ref 0.10–4.00)

## 2018-01-20 LAB — TSH: TSH: 2.03 u[IU]/mL (ref 0.35–4.50)

## 2018-01-20 LAB — LIPID PANEL
CHOLESTEROL: 214 mg/dL — AB (ref 0–200)
HDL: 33.8 mg/dL — ABNORMAL LOW (ref >39.00)
LDL CALC: 141 mg/dL — AB (ref 0–99)
NonHDL: 179.74
Total CHOL/HDL Ratio: 6
Triglycerides: 194 mg/dL — ABNORMAL HIGH (ref 0.0–149.0)
VLDL: 38.8 mg/dL (ref 0.0–40.0)

## 2018-01-20 LAB — BASIC METABOLIC PANEL
BUN: 18 mg/dL (ref 6–23)
CHLORIDE: 104 meq/L (ref 96–112)
CO2: 29 mEq/L (ref 19–32)
CREATININE: 1.03 mg/dL (ref 0.40–1.50)
Calcium: 9.3 mg/dL (ref 8.4–10.5)
GFR: 73.76 mL/min (ref >60.00)
Glucose, Bld: 94 mg/dL (ref 70–99)
POTASSIUM: 4.6 meq/L (ref 3.5–5.1)
Sodium: 141 mEq/L (ref 135–145)

## 2018-01-20 LAB — CBC WITH DIFFERENTIAL/PLATELET
BASOS PCT: 0.3 % (ref 0.0–3.0)
Basophils Absolute: 0 10*3/uL (ref 0.0–0.1)
EOS PCT: 2.3 % (ref 0.0–5.0)
Eosinophils Absolute: 0.1 10*3/uL (ref 0.0–0.7)
HCT: 47.2 % (ref 39.0–52.0)
Hemoglobin: 15.8 g/dL (ref 13.0–17.0)
LYMPHS ABS: 1.3 10*3/uL (ref 0.7–4.0)
Lymphocytes Relative: 29 % (ref 12.0–46.0)
MCHC: 33.4 g/dL (ref 30.0–36.0)
MCV: 92.3 fl (ref 78.0–100.0)
MONO ABS: 0.4 10*3/uL (ref 0.1–1.0)
Monocytes Relative: 8.3 % (ref 3.0–12.0)
NEUTROS PCT: 60.1 % (ref 43.0–77.0)
Neutro Abs: 2.8 10*3/uL (ref 1.4–7.7)
Platelets: 157 10*3/uL (ref 150.0–400.0)
RBC: 5.12 Mil/uL (ref 4.22–5.81)
RDW: 14 % (ref 11.5–15.5)
WBC: 4.6 10*3/uL (ref 4.0–10.5)

## 2018-01-20 NOTE — Progress Notes (Signed)
[name], male   DOB: 06-03-1935, 83 y.o.   MRN: [id number]   Subjective:    [name], male    DOB: 20-Aug-1935, 83 y.o.   MRN: [id number]  HPI  Patient here for his physical exam.  He reports he has been doing relatively well.  Does report he has noticed being more sob with exertion.  No chest pain.  Has noticed this change more recently.  No cough or congestion.  Allergy symptoms he is controlling with flonase.  No acid reflux.  No abdominal pain.  Some constipation.  Minimal.  No urine change.     Past Medical History:  Diagnosis Date  . Acoustic neuroma ([place])   . History of colonic polyps   . History of craniotomy   . Hypercholesterolemia   . Macular degeneration   . Psoriasis   . Rheumatic fever age 77  . Rosacea   . Scarlet fever age 5   Past Surgical History:  Procedure Laterality Date  . APPENDECTOMY  1959  . BUNIONECTOMY    . COCHLEAR IMPLANT     acute hearing loss  . CRANIECTOMY FOR EXCISION OF ACOUSTIC NEUROMA  1994  . craniotomy for meningoma     Family History  Problem Relation Age of Onset  . Hypertension Mother   . Heart disease Mother   . Cancer Father 45       prostate  . Stroke Father   . Colon cancer Neg Hx    Social History   Socioeconomic History  . Marital status: Single    Spouse name: Not on file  . Number of children: 3  . Years of education: Not on file  . Highest education level: Not on file  Occupational History  . Not on file  Social Needs  . Financial resource strain: Not on file  . Food insecurity:    Worry: Not on file    Inability: Not on file  . Transportation needs:    Medical: Not on file    Non-medical: Not on file  Tobacco Use  . Smoking status: Former Smoker    Last attempt to quit: 10/05/2000    Years since quitting: 17.3  . Smokeless tobacco: Never Used  Substance and Sexual Activity  . Alcohol use: Yes    Alcohol/week: 0.0 oz    Comment: occasional wine  . Drug use: No  .  Sexual activity: Not on file  Lifestyle  . Physical activity:    Days per week: Not on file    Minutes per session: Not on file  . Stress: Not on file  Relationships  . Social connections:    Talks on phone: Not on file    Gets together: Not on file    Attends religious service: Not on file    Active member of club or organization: Not on file    Attends meetings of clubs or organizations: Not on file    Relationship status: Not on file  Other Topics Concern  . Not on file  Social History Narrative  . Not on file    Outpatient Encounter Medications as of 01/20/2018  Medication Sig  . aspirin EC 81 MG tablet Take 81 mg by mouth daily.  . fluticasone (FLONASE) 50 MCG/ACT nasal spray USE 2 SPRAYS IN EACH NOSTRIL DAILY  . Multiple Vitamins-Minerals (VISION-VITE PRESERVE PO) Take by mouth.  . timolol (TIMOPTIC) 0.5 % ophthalmic solution Place 1 drop into both eyes daily.  No facility-administered encounter medications on file as of 01/20/2018.     Review of Systems  Constitutional: Negative for appetite change and unexpected weight change.  HENT: Negative for congestion and sinus pressure.   Eyes: Negative for pain and visual disturbance.  Respiratory: Positive for shortness of breath. Negative for cough and chest tightness.        Has noticed some sob with exertion.   Cardiovascular: Negative for chest pain, palpitations and leg swelling.  Gastrointestinal: Positive for constipation. Negative for abdominal pain, diarrhea and nausea.  Genitourinary: Negative for difficulty urinating and dysuria.  Musculoskeletal: Negative for joint swelling and myalgias.  Skin: Negative for color change and rash.  Neurological: Negative for dizziness, light-headedness and headaches.  Hematological: Negative for adenopathy. Does not bruise/bleed easily.  Psychiatric/Behavioral: Negative for agitation and dysphoric mood.       Objective:    Physical Exam  Constitutional: He is oriented to  person, place, and time. He appears well-developed and well-nourished. No distress.  HENT:  Head: Normocephalic and atraumatic.  Nose: Nose normal.  Mouth/Throat: Oropharynx is clear and moist. No oropharyngeal exudate.  Eyes: Conjunctivae are normal. Right eye exhibits no discharge. Left eye exhibits no discharge.  Neck: Neck supple. No thyromegaly present.  Cardiovascular: Normal rate and regular rhythm.  Pulmonary/Chest: Breath sounds normal. No respiratory distress. He has no wheezes.  Abdominal: Soft. Bowel sounds are normal. There is no tenderness.  Genitourinary:  Genitourinary Comments: Not performed.   Musculoskeletal: He exhibits no edema or tenderness.  Lymphadenopathy:    He has no cervical adenopathy.  Neurological: He is alert and oriented to person, place, and time.  Skin: Skin is warm and dry. No rash noted.  Psychiatric: He has a normal mood and affect. His behavior is normal.    BP 126/72 (BP Location: Left Arm, Patient Position: Sitting, Cuff Size: Normal)   Pulse 64   Temp 97.7 F (36.5 C) (Oral)   Resp 18   Wt 195 lb (88.5 kg)   SpO2 96%   BMI 29.65 kg/m  Wt Readings from Last 3 Encounters:  01/20/18 195 lb (88.5 kg)  07/21/17 196 lb 12.8 oz (89.3 kg)  07/08/17 197 lb 12.8 oz (89.7 kg)     Lab Results  Component Value Date   WBC 4.6 01/20/2018   HGB 15.8 01/20/2018   HCT 47.2 01/20/2018   PLT 157.0 01/20/2018   GLUCOSE 94 01/20/2018   CHOL 214 (H) 01/20/2018   TRIG 194.0 (H) 01/20/2018   HDL 33.80 (L) 01/20/2018   LDLDIRECT 148.0 07/21/2017   LDLCALC 141 (H) 01/20/2018   ALT 33 01/20/2018   AST 25 01/20/2018   NA 141 01/20/2018   K 4.6 01/20/2018   CL 104 01/20/2018   CREATININE 1.03 01/20/2018   BUN 18 01/20/2018   CO2 29 01/20/2018   TSH 2.03 01/20/2018   PSA 0.39 01/20/2018    US Carotid Duplex Bilateral  Result Date: 07/29/2016 CLINICAL DATA:  Carotid bruit. EXAM: BILATERAL CAROTID DUPLEX ULTRASOUND TECHNIQUE: Pearline Cables scale imaging,  color Doppler and duplex ultrasound were performed of bilateral carotid and vertebral arteries in the neck. COMPARISON:  02/09/2012. FINDINGS: Criteria: Quantification of carotid stenosis is based on velocity parameters that correlate the residual internal carotid diameter with NASCET-based stenosis levels, using the diameter of the distal internal carotid lumen as the denominator for stenosis measurement. The following velocity measurements were obtained: RIGHT ICA:  92/20 cm/sec CCA:  809/9 2 cm/sec SYSTOLIC ICA/CCA RATIO:  0.8 DIASTOLIC ICA/CCA  RATIO:  0.8 ECA:  105 cm/sec LEFT ICA:  75/25 cm/sec CCA:  374/82 cm/sec SYSTOLIC ICA/CCA RATIO:  0 point set DIASTOLIC ICA/CCA RATIO:  1.0 ECA:  112 cm/sec RIGHT CAROTID ARTERY: Mild right carotid bifurcation/proximal ICA atherosclerotic vascular plaque. No flow limiting stenosis. RIGHT VERTEBRAL ARTERY:  Patent with antegrade flow. LEFT CAROTID ARTERY: Mild left carotid bifurcation/proximal ICA atherosclerotic vascular plaque. No flow limiting stenosis. LEFT VERTEBRAL ARTERY:  Patent antegrade flow. IMPRESSION: 1. Mild bilateral carotid bifurcation atherosclerotic vascular plaque. No flow limiting stenosis. Degree of stenosis less than 50% bilaterally. 2. Vertebral arteries are patent antegrade flow. Electronically Signed   By: Marcello Moores  Register   On: 07/29/2016 14:12       Assessment & Plan:   Problem List Items Addressed This Visit    Health care maintenance    Physical today 01/20/18.  colonoscopy 07/23/11 with polyps.  Recommended f/u in 3-5 years.  Discussed again with him today.  He wants to hold on any further colon cancer screening/testing.  Check psa today.       Hearing loss    S/p cochlear implant.  Doing well.       Hypercholesterolemia    Low cholesterol diet and exercise.  Declines cholesterol medication.  Follow lipid panel.        Relevant Orders   Hepatic function panel (Completed)   Lipid panel (Completed)   Basic metabolic panel  (Completed)   SOB (shortness of breath) on exertion - Primary    Has noticed sob with exertion.  EKG - SR with no acute ischemic changes.  Discussed with him today regarding my desire for further cardiac evaluation and w/up.  He declines.  Wants to monitor.  Will notify me if he changes his mind or if symptoms change.  Will check cxr.        Relevant Orders   EKG 12-Lead (Completed)   TSH (Completed)   DG Chest 2 View (Completed)   Thrombocytopenia (HCC)    Recheck cbc.        Relevant Orders   CBC with Differential/Platelet (Completed)    Other Visit Diagnoses    Prostate cancer screening       Relevant Orders   PSA, Medicare (Completed)       Einar Pheasant, MD

## 2018-01-20 NOTE — Assessment & Plan Note (Signed)
Physical today 01/20/18.  colonoscopy 07/23/11 with polyps.  Recommended f/u in 3-5 years.  Discussed again with him today.  He wants to hold on any further colon cancer screening/testing.  Check psa today.

## 2018-01-23 ENCOUNTER — Encounter: Payer: Self-pay | Admitting: Internal Medicine

## 2018-01-23 NOTE — Assessment & Plan Note (Signed)
Recheck cbc.  

## 2018-01-23 NOTE — Assessment & Plan Note (Signed)
S/p cochlear implant.  Doing well.

## 2018-01-23 NOTE — Assessment & Plan Note (Addendum)
Has noticed sob with exertion.  EKG - SR with no acute ischemic changes.  Discussed with him today regarding my desire for further cardiac evaluation and w/up.  He declines.  Wants to monitor.  Will notify me if he changes his mind or if symptoms change.  Will check cxr.

## 2018-01-23 NOTE — Assessment & Plan Note (Signed)
Low cholesterol diet and exercise.  Declines cholesterol medication.  Follow lipid panel.  

## 2018-01-24 ENCOUNTER — Encounter: Payer: Self-pay | Admitting: Internal Medicine

## 2018-03-05 ENCOUNTER — Other Ambulatory Visit: Payer: Self-pay | Admitting: Internal Medicine

## 2018-05-11 ENCOUNTER — Ambulatory Visit (INDEPENDENT_AMBULATORY_CARE_PROVIDER_SITE_OTHER): Payer: Medicare Other | Admitting: Internal Medicine

## 2018-05-11 DIAGNOSIS — R0602 Shortness of breath: Secondary | ICD-10-CM | POA: Diagnosis not present

## 2018-05-11 DIAGNOSIS — R079 Chest pain, unspecified: Secondary | ICD-10-CM

## 2018-05-11 DIAGNOSIS — K219 Gastro-esophageal reflux disease without esophagitis: Secondary | ICD-10-CM

## 2018-05-11 DIAGNOSIS — E7800 Pure hypercholesterolemia, unspecified: Secondary | ICD-10-CM | POA: Diagnosis not present

## 2018-05-11 NOTE — Progress Notes (Signed)
[name], male   DOB: 1935/05/25, 82 y.o.   MRN: [id number]   Subjective:    [name], male    DOB: [birth year], 83 y.o.   MRN: [id number]  HPI  Patient here for a scheduled follow up.  Has not felt as well recently.  States he has noticed being more sob with activity.  Went to [facility].  Had to walk up and down a hill.  States when he arrived at his appointment, he was panting and sweating.  Blood pressure was elevated initially.  Resolved after 15-20 minutes.  Blood pressure returned to normal prior to leaving - 126/73.  No chest pain.  Has felt better this week.  Has noticed some increased acid reflux.  Takes prilosec prn.  May noticed once every two weeks.  No abdominal pain.  Bowels moving.  Increased stress recently.  States he talked to a friend and his pastor this weekend.  Feels better.  Expects this situation to get better soon.     Past Medical History:  Diagnosis Date  . Acoustic neuroma ([place])   . History of colonic polyps   . History of craniotomy   . Hypercholesterolemia   . Macular degeneration   . Psoriasis   . Rheumatic fever age 19  . Rosacea   . Scarlet fever age 23   Past Surgical History:  Procedure Laterality Date  . APPENDECTOMY  1959  . BUNIONECTOMY    . COCHLEAR IMPLANT     acute hearing loss  . CRANIECTOMY FOR EXCISION OF ACOUSTIC NEUROMA  1994  . craniotomy for meningoma     Family History  Problem Relation Age of Onset  . Hypertension Mother   . Heart disease Mother   . Cancer Father 54       prostate  . Stroke Father   . Colon cancer Neg Hx    Social History   Socioeconomic History  . Marital status: Divorced    Spouse name: Not on file  . Number of children: 3  . Years of education: Not on file  . Highest education level: Not on file  Occupational History  . Not on file  Social Needs  . Financial resource strain: Not on file  . Food insecurity:    Worry: Not on file    Inability: Not on  file  . Transportation needs:    Medical: Not on file    Non-medical: Not on file  Tobacco Use  . Smoking status: Former Smoker    Last attempt to quit: 10/05/2000    Years since quitting: 17.6  . Smokeless tobacco: Never Used  Substance and Sexual Activity  . Alcohol use: Yes    Alcohol/week: 0.0 standard drinks    Comment: occasional wine  . Drug use: No  . Sexual activity: Not on file  Lifestyle  . Physical activity:    Days per week: Not on file    Minutes per session: Not on file  . Stress: Not on file  Relationships  . Social connections:    Talks on phone: Not on file    Gets together: Not on file    Attends religious service: Not on file    Active member of club or organization: Not on file    Attends meetings of clubs or organizations: Not on file    Relationship status: Not on file  Other Topics Concern  . Not on file  Social History Narrative  .  Not on file    Outpatient Encounter Medications as of 05/11/2018  Medication Sig  . aspirin EC 81 MG tablet Take 81 mg by mouth daily.  . fluticasone (FLONASE) 50 MCG/ACT nasal spray USE 2 SPRAYS IN EACH NOSTRIL DAILY  . Multiple Vitamins-Minerals (VISION-VITE PRESERVE PO) Take by mouth.  . timolol (TIMOPTIC) 0.5 % ophthalmic solution Place 1 drop into both eyes daily.   No facility-administered encounter medications on file as of 05/11/2018.     Review of Systems  Constitutional: Negative for appetite change and unexpected weight change.  HENT: Negative for congestion and sinus pressure.   Respiratory: Positive for shortness of breath. Negative for cough and chest tightness.   Cardiovascular: Negative for chest pain, palpitations and leg swelling.  Gastrointestinal: Negative for abdominal pain, diarrhea, nausea and vomiting.  Genitourinary: Negative for difficulty urinating and dysuria.  Musculoskeletal: Negative for joint swelling and myalgias.  Skin: Negative for color change and rash.  Neurological: Negative for  dizziness, light-headedness and headaches.  Psychiatric/Behavioral: Negative for agitation and dysphoric mood.       Increased stress as outlined.         Objective:     Blood pressure rechecked by me:  136/78  Physical Exam  Constitutional: He appears well-developed and well-nourished. No distress.  HENT:  Nose: Nose normal.  Mouth/Throat: Oropharynx is clear and moist.  Neck: Neck supple. No thyromegaly present.  Cardiovascular: Normal rate and regular rhythm.  Pulmonary/Chest: Effort normal and breath sounds normal. No respiratory distress.  Abdominal: Soft. Bowel sounds are normal. There is no tenderness.  Musculoskeletal: He exhibits no edema or tenderness.  Lymphadenopathy:    He has no cervical adenopathy.  Skin: No rash noted. No erythema.  Psychiatric: He has a normal mood and affect. His behavior is normal.    BP (!) 142/82 (BP Location: Left Arm, Patient Position: Sitting, Cuff Size: Large)   Pulse 83   Temp 97.9 F (36.6 C) (Oral)   Resp 18   Wt 200 lb 12.8 oz (91.1 kg)   SpO2 96%   BMI 30.53 kg/m  Wt Readings from Last 3 Encounters:  05/11/18 200 lb 12.8 oz (91.1 kg)  01/20/18 195 lb (88.5 kg)  07/21/17 196 lb 12.8 oz (89.3 kg)     Lab Results  Component Value Date   WBC 4.6 01/20/2018   HGB 15.8 01/20/2018   HCT 47.2 01/20/2018   PLT 157.0 01/20/2018   GLUCOSE 94 01/20/2018   CHOL 214 (H) 01/20/2018   TRIG 194.0 (H) 01/20/2018   HDL 33.80 (L) 01/20/2018   LDLDIRECT 148.0 07/21/2017   LDLCALC 141 (H) 01/20/2018   ALT 33 01/20/2018   AST 25 01/20/2018   NA 141 01/20/2018   K 4.6 01/20/2018   CL 104 01/20/2018   CREATININE 1.03 01/20/2018   BUN 18 01/20/2018   CO2 29 01/20/2018   TSH 2.03 01/20/2018   PSA 0.39 01/20/2018    US Carotid Duplex Bilateral  Result Date: 07/29/2016 CLINICAL DATA:  Carotid bruit. EXAM: BILATERAL CAROTID DUPLEX ULTRASOUND TECHNIQUE: Pearline Cables scale imaging, color Doppler and duplex ultrasound were performed of  bilateral carotid and vertebral arteries in the neck. COMPARISON:  02/09/2012. FINDINGS: Criteria: Quantification of carotid stenosis is based on velocity parameters that correlate the residual internal carotid diameter with NASCET-based stenosis levels, using the diameter of the distal internal carotid lumen as the denominator for stenosis measurement. The following velocity measurements were obtained: RIGHT ICA:  92/20 cm/sec CCA:  113/9 2 cm/sec  SYSTOLIC ICA/CCA RATIO:  0.8 DIASTOLIC ICA/CCA RATIO:  0.8 ECA:  105 cm/sec LEFT ICA:  75/25 cm/sec CCA:  453/64 cm/sec SYSTOLIC ICA/CCA RATIO:  0 point set DIASTOLIC ICA/CCA RATIO:  1.0 ECA:  112 cm/sec RIGHT CAROTID ARTERY: Mild right carotid bifurcation/proximal ICA atherosclerotic vascular plaque. No flow limiting stenosis. RIGHT VERTEBRAL ARTERY:  Patent with antegrade flow. LEFT CAROTID ARTERY: Mild left carotid bifurcation/proximal ICA atherosclerotic vascular plaque. No flow limiting stenosis. LEFT VERTEBRAL ARTERY:  Patent antegrade flow. IMPRESSION: 1. Mild bilateral carotid bifurcation atherosclerotic vascular plaque. No flow limiting stenosis. Degree of stenosis less than 50% bilaterally. 2. Vertebral arteries are patent antegrade flow. Electronically Signed   By: Marcello Moores  Register   On: 07/29/2016 14:12       Assessment & Plan:   Problem List Items Addressed This Visit    GERD (gastroesophageal reflux disease)    Recommend PPI on a regular basis.  Follow.        Hypercholesterolemia    Has declined cholesterol medication previously.  Discussed again today.  Low cholesterol diet and exercise.  Follow lipid panel.        Relevant Orders   Hepatic function panel   Lipid panel   Basic metabolic panel   SOB (shortness of breath) on exertion    Increased sob with exertion.  Worsening recently.  Actually has felt better this week.  EKG - SR with non specific ST/Twave changes.  Discussed with him today regarding the need for further cardiac  evaluation.  He is in agreement.  Wants to see Dr Peter Martinique.         Other Visit Diagnoses    Chest pain, unspecified type       Relevant Orders   EKG 12-Lead (Completed)   SOB (shortness of breath)       Relevant Orders   Ambulatory referral to Cardiology       Einar Pheasant, MD

## 2018-05-15 ENCOUNTER — Encounter: Payer: Self-pay | Admitting: Internal Medicine

## 2018-05-15 DIAGNOSIS — K219 Gastro-esophageal reflux disease without esophagitis: Secondary | ICD-10-CM | POA: Insufficient documentation

## 2018-05-15 NOTE — Assessment & Plan Note (Signed)
Increased sob with exertion.  Worsening recently.  Actually has felt better this week.  EKG - SR with non specific ST/Twave changes.  Discussed with him today regarding the need for further cardiac evaluation.  He is in agreement.  Wants to see Dr Peter Martinique.

## 2018-05-15 NOTE — Assessment & Plan Note (Signed)
Has declined cholesterol medication previously.  Discussed again today.  Low cholesterol diet and exercise.  Follow lipid panel.

## 2018-05-15 NOTE — Assessment & Plan Note (Signed)
Recommend PPI on a regular basis.  Follow.

## 2018-05-18 ENCOUNTER — Other Ambulatory Visit (INDEPENDENT_AMBULATORY_CARE_PROVIDER_SITE_OTHER): Payer: Medicare Other

## 2018-05-18 DIAGNOSIS — E7800 Pure hypercholesterolemia, unspecified: Secondary | ICD-10-CM

## 2018-05-18 LAB — BASIC METABOLIC PANEL
BUN: 20 mg/dL (ref 6–23)
CHLORIDE: 105 meq/L (ref 96–112)
CO2: 31 meq/L (ref 19–32)
CREATININE: 1.11 mg/dL (ref 0.40–1.50)
Calcium: 9.8 mg/dL (ref 8.4–10.5)
GFR: 67.6 mL/min (ref >60.00)
GLUCOSE: 94 mg/dL (ref 70–99)
POTASSIUM: 4.6 meq/L (ref 3.5–5.1)
Sodium: 142 mEq/L (ref 135–145)

## 2018-05-18 LAB — HEPATIC FUNCTION PANEL
ALK PHOS: 76 U/L (ref 39–117)
ALT: 40 U/L (ref 0–53)
AST: 27 U/L (ref 0–37)
Albumin: 4.2 g/dL (ref 3.5–5.2)
BILIRUBIN DIRECT: 0.2 mg/dL (ref 0.0–0.3)
Total Bilirubin: 1.2 mg/dL (ref 0.2–1.2)
Total Protein: 6.7 g/dL (ref 6.0–8.3)

## 2018-05-18 LAB — LDL CHOLESTEROL, DIRECT: Direct LDL: 149 mg/dL

## 2018-05-18 LAB — LIPID PANEL
CHOL/HDL RATIO: 6
Cholesterol: 206 mg/dL — ABNORMAL HIGH (ref 0–200)
HDL: 32.7 mg/dL — ABNORMAL LOW (ref >39.00)
NONHDL: 173.76
Triglycerides: 278 mg/dL — ABNORMAL HIGH (ref 0.0–149.0)
VLDL: 55.6 mg/dL — AB (ref 0.0–40.0)

## 2018-06-30 NOTE — Progress Notes (Signed)
Cardiology Office Note   Date:  07/04/2018   ID:  Cyler, Kappes [birth year], MRN [id number]  PCP:  Einar Pheasant, MD  Cardiologist:   Peter Martinique, MD   Chief Complaint  Patient presents with  . Shortness of Breath      History of Present Illness: Benjamin Carney is a 83 y.o. male who is seen at the request of Einar Pheasant MD for evaluation of dyspnea on exertion. He has a history of HLD. He is a former smoker but quit 20 yrs ago. He reports now a one year history of dyspnea on exertion. No dyspnea at rest. No PND or orthopnea. Denies any chest pain. Mild ankle swelling in the evening. He is concerned because his brother had to have emergent CABG last year. He does note a dry hack sometimes. His dyspnea resolves with rest but often takes an hour to recover and then he feels weak. His is fairly sedentary but does play golf and does a little yard work.     Past Medical History:  Diagnosis Date  . Acoustic neuroma ([place])   . History of colonic polyps   . History of craniotomy   . Hypercholesterolemia   . Macular degeneration   . Psoriasis   . Rheumatic fever age 71  . Rosacea   . Scarlet fever age 89    Past Surgical History:  Procedure Laterality Date  . APPENDECTOMY  1959  . BUNIONECTOMY    . COCHLEAR IMPLANT     acute hearing loss  . CRANIECTOMY FOR EXCISION OF ACOUSTIC NEUROMA  1994  . craniotomy for meningoma       Current Outpatient Medications  Medication Sig Dispense Refill  . aspirin EC 81 MG tablet Take 81 mg by mouth daily.    . fluticasone (FLONASE) 50 MCG/ACT nasal spray USE 2 SPRAYS IN EACH NOSTRIL DAILY 48 g 0  . Multiple Vitamins-Minerals (VISION-VITE PRESERVE PO) Take by mouth.    . timolol (TIMOPTIC) 0.5 % ophthalmic solution Place 1 drop into both eyes daily.     No current facility-administered medications for this visit.     Allergies:   Metronidazole    Social History:  The patient  reports that he quit smoking about 17 years  ago. He has a 60.00 pack-year smoking history. He has never used smokeless tobacco. He reports that he drinks alcohol. He reports that he does not use drugs.   Family History:  The patient's family history includes Cancer (age of onset: 57) in his father; Heart disease in his mother; Heart disease (age of onset: 73) in his brother; Heart failure in his mother; Hypertension in his mother; Stroke in his father and son.    ROS:  Please see the history of present illness.   Otherwise, review of systems are positive for none.   All other systems are reviewed and negative.    PHYSICAL EXAM: VS:  BP 140/80 (BP Location: Right Arm)   Pulse 64   Ht 5\' 8"  (1.727 m)   Wt 199 lb 9.6 oz (90.5 kg)   BMI 30.35 kg/m  , BMI Body mass index is 30.35 kg/m. GEN: Well nourished, well developed, in no acute distress  HEENT: normal  Neck:  JVD to 5 cm, carotid bruits, or masses Cardiac: RRR; no murmurs, rubs, or gallops,no edema  Respiratory:  clear to auscultation bilaterally, normal work of breathing GI: soft, nontender, nondistended, + BS MS: no deformity or atrophy  Skin: warm  and dry, no rash Neuro:  Strength and sensation are intact Psych: euthymic mood, full affect   EKG:  EKG is not ordered today. The ekg ordered 05/11/18 demonstrates NSR with LAFB and nonspecific IVCD. I have personally reviewed and interpreted this study.    Recent Labs: 01/20/2018: Hemoglobin 15.8; Platelets 157.0; TSH 2.03 05/18/2018: ALT 40; BUN 20; Creatinine, Ser 1.11; Potassium 4.6; Sodium 142    Lipid Panel    Component Value Date/Time   CHOL 206 (H) 05/18/2018 0806   TRIG 278.0 (H) 05/18/2018 0806   HDL 32.70 (L) 05/18/2018 0806   CHOLHDL 6 05/18/2018 0806   VLDL 55.6 (H) 05/18/2018 0806   LDLCALC 141 (H) 01/20/2018 0833   LDLDIRECT 149.0 05/18/2018 0806      Wt Readings from Last 3 Encounters:  07/04/18 199 lb 9.6 oz (90.5 kg)  05/11/18 200 lb 12.8 oz (91.1 kg)  01/20/18 195 lb (88.5 kg)      Other  studies Reviewed: Additional studies/ records that were reviewed today include: CHEST - 2 VIEW  COMPARISON:  No prior.  FINDINGS: Mediastinum and hilar structures normal. Cardiomegaly with normal pulmonary vascularity. Low lung volumes with mild basilar atelectasis. No pleural effusion or pneumothorax.  IMPRESSION: Number low lung volumes with mild basilar atelectasis.  2.  Mild cardiomegaly.  No pulmonary venous congestion.   Electronically Signed   By: Marcello Moores  Register   On: 01/20/2018 10:12  ASSESSMENT AND PLAN:  1.  Dyspnea on exertion. Differential includes LV dysfunction, ischemia or COPD. He is fairly sedentary. Prior CXR showed mild CM. Ecg showed new development of nonspecific IVCD. Will arrange for Echo and Nuclear stress test. If these studies are normal will pursue PFTs 2. Hypercholesterolemia. If he does have CAD will need to readdress cholesterol therapy. Intolerant of crestor in the past.    Current medicines are reviewed at length with the patient today.  The patient does not have concerns regarding medicines.  The following changes have been made:  no change  Labs/ tests ordered today include:  No orders of the defined types were placed in this encounter.    Disposition:   FU with me in 2 months  Signed, Peter Martinique, MD  07/04/2018 1:53 PM    [facility] [address] [place], [place], [address] Phone [phone], Fax [phone]

## 2018-07-04 ENCOUNTER — Ambulatory Visit (INDEPENDENT_AMBULATORY_CARE_PROVIDER_SITE_OTHER): Payer: Medicare Other | Admitting: Cardiology

## 2018-07-04 ENCOUNTER — Encounter: Payer: Self-pay | Admitting: Cardiology

## 2018-07-04 ENCOUNTER — Encounter

## 2018-07-04 VITALS — BP 140/80 | HR 64 | Ht 68.0 in | Wt 199.6 lb

## 2018-07-04 DIAGNOSIS — E7800 Pure hypercholesterolemia, unspecified: Secondary | ICD-10-CM | POA: Diagnosis not present

## 2018-07-04 DIAGNOSIS — R0609 Other forms of dyspnea: Secondary | ICD-10-CM | POA: Diagnosis not present

## 2018-07-04 NOTE — Patient Instructions (Signed)
We will schedule you for a nuclear stress test and Echocardiogram

## 2018-07-07 ENCOUNTER — Telehealth (HOSPITAL_COMMUNITY): Payer: Self-pay

## 2018-07-07 NOTE — Telephone Encounter (Signed)
Encounter complete. 

## 2018-07-11 ENCOUNTER — Ambulatory Visit (INDEPENDENT_AMBULATORY_CARE_PROVIDER_SITE_OTHER): Payer: Medicare Other

## 2018-07-11 VITALS — BP 118/70 | HR 70 | Temp 97.8°F | Resp 16 | Ht 68.0 in | Wt 202.0 lb

## 2018-07-11 DIAGNOSIS — Z0000 Encounter for general adult medical examination without abnormal findings: Secondary | ICD-10-CM

## 2018-07-11 NOTE — Patient Instructions (Addendum)
  Mr. Cuppett , Thank you for taking time to come for your Medicare Wellness Visit. I appreciate your ongoing commitment to your health goals. Please review the following plan we discussed and let me know if I can assist you in the future.   Follow up as needed.    Bring a copy of your [place] and/or Living Will to be scanned into chart.  Keep all routine maintenance appointments.   Have a great day!  These are the goals we discussed: Goals      Patient Stated   . Increase physical activity (pt-stated)     Stay active       This is a list of the screening recommended for you and due dates:  Health Maintenance  Topic Date Due  . Tetanus Vaccine  07/20/1956  . Pneumonia vaccines  Completed  . Flu Shot  Discontinued

## 2018-07-11 NOTE — Progress Notes (Addendum)
Subjective:   Benjamin Carney is a 83 y.o. male who presents for Medicare Annual/Subsequent preventive examination.  Review of Systems:  No ROS.  Medicare Wellness Visit. Additional risk factors are reflected in the social history. Cardiac Risk Factors include: advanced age (>3men, >73 women);male gender     Objective:    Vitals: BP 118/70 (BP Location: Left Arm, Patient Position: Sitting, Cuff Size: Normal)   Pulse 70   Temp 97.8 F (36.6 C) (Oral)   Resp 16   Ht 5\' 8"  (1.727 m)   Wt 202 lb (91.6 kg)   SpO2 95%   BMI 30.71 kg/m   Body mass index is 30.71 kg/m.  Advanced Directives 07/11/2018 07/08/2017  Does Patient Have a Medical Advance Directive? Yes Yes  Type of Paramedic of [place];Living will [place];Living will  Does patient want to make changes to medical advance directive? No - Patient declined No - Patient declined  Copy of [place] in Chart? No - copy requested No - copy requested    Tobacco Social History   Tobacco Use  Smoking Status Former Smoker  . Packs/day: 1.50  . Years: 40.00  . Pack years: 60.00  . Last attempt to quit: 10/05/2000  . Years since quitting: 17.7  Smokeless Tobacco Never Used     Counseling given: Not Answered   Clinical Intake:  Pre-visit preparation completed: Yes  Pain : No/denies pain     Diabetes: No  How often do you need to have someone help you when you read instructions, pamphlets, or other written materials from your doctor or pharmacy?: 1 - Never  Interpreter Needed?: No     Past Medical History:  Diagnosis Date  . Acoustic neuroma ([place])   . History of colonic polyps   . History of craniotomy   . Hypercholesterolemia   . Macular degeneration   . Psoriasis   . Rheumatic fever age 18  . Rosacea   . Scarlet fever age 74   Past Surgical History:  Procedure Laterality Date  . APPENDECTOMY  1959  . BUNIONECTOMY    . COCHLEAR IMPLANT       acute hearing loss  . CRANIECTOMY FOR EXCISION OF ACOUSTIC NEUROMA  1994  . craniotomy for meningoma     Family History  Problem Relation Age of Onset  . Hypertension Mother   . Heart disease Mother   . Heart failure Mother   . Cancer Father 35       prostate  . Stroke Father   . Heart disease Brother 47       CABG  . Stroke Son   . Colon cancer Neg Hx    Social History   Socioeconomic History  . Marital status: Divorced    Spouse name: Not on file  . Number of children: 3  . Years of education: Not on file  . Highest education level: Not on file  Occupational History  . Not on file  Social Needs  . Financial resource strain: Not hard at all  . Food insecurity:    Worry: Never true    Inability: Never true  . Transportation needs:    Medical: No    Non-medical: No  Tobacco Use  . Smoking status: Former Smoker    Packs/day: 1.50    Years: 40.00    Pack years: 60.00    Last attempt to quit: 10/05/2000    Years since quitting: 17.7  .  Smokeless tobacco: Never Used  Substance and Sexual Activity  . Alcohol use: Yes    Alcohol/week: 0.0 standard drinks    Comment: occasional wine  . Drug use: No  . Sexual activity: Not on file  Lifestyle  . Physical activity:    Days per week: 0 days    Minutes per session: Not on file  . Stress: Not at all  Relationships  . Social connections:    Talks on phone: Not on file    Gets together: Not on file    Attends religious service: Not on file    Active member of club or organization: Not on file    Attends meetings of clubs or organizations: Not on file    Relationship status: Not on file  Other Topics Concern  . Not on file  Social History Narrative  . Not on file    Outpatient Encounter Medications as of 07/11/2018  Medication Sig  . aspirin EC 81 MG tablet Take 81 mg by mouth daily.  . fluticasone (FLONASE) 50 MCG/ACT nasal spray USE 2 SPRAYS IN EACH NOSTRIL DAILY  . Multiple Vitamins-Minerals (VISION-VITE  PRESERVE PO) Take by mouth.  . timolol (TIMOPTIC) 0.5 % ophthalmic solution Place 1 drop into both eyes daily.  Marland Kitchen triamcinolone cream (KENALOG) 0.1 % Apply 1 application topically as needed (for Rosacea).   No facility-administered encounter medications on file as of 07/11/2018.     Activities of Daily Living In your present state of health, do you have any difficulty performing the following activities: 07/11/2018  Hearing? Y  Comment Hearing aid  Vision? N  Difficulty concentrating or making decisions? N  Walking or climbing stairs? Y  Comment SOB.  Followed by pcp and cardiology.   Dressing or bathing? N  Doing errands, shopping? N  Preparing Food and eating ? N  Using the Toilet? N  In the past six months, have you accidently leaked urine? N  Do you have problems with loss of bowel control? N  Managing your Medications? N  Managing your Finances? N  Housekeeping or managing your Housekeeping? N  Some recent data might be hidden    Patient Care Team: Einar Pheasant, MD as PCP - General (Internal Medicine)   Assessment:   This is a routine wellness examination for Benjamin Carney.  The goal of the wellness visit is to assist the patient how to close the gaps in care and create a preventative care plan for the patient.   The roster of all physicians providing medical care to patient is listed in the Snapshot section of the chart.  Safety issues reviewed; Lives alone. Smoke and carbon monoxide detectors in the home. Firearm safety discussed. Wears seatbelts when driving or riding with others. No violence in the home.  They do not have excessive sun exposure.  Discussed the need for sun protection: hats, long sleeves and the use of sunscreen if there is significant sun exposure.  Patient is alert, normal appearance, oriented to person/place/and time.  Correctly identified the president of the [place] and recalls of 3/3 words. Performs simple calculations and can read correct time from  watch face.  Displays appropriate judgement.  No new identified risk were noted.  No failures at ADL's or IADL's.    BMI- discussed the importance of a healthy diet, water intake and the benefits of aerobic exercise.  He tries to have a healthy diet, stays active and hydrated.   Dental- every 4 months.  Sleep patterns- Sleeps fair through  the night.   Influenza declined.   TDAP vaccine deferred per patient preference.  Follow up with insurance.  Educational material provided.  Exercise Activities and Dietary recommendations Current Exercise Habits: Home exercise routine, Type of exercise: walking(golf), Time (Minutes): 20, Frequency (Times/Week): 2, Weekly Exercise (Minutes/Week): 40, Intensity: Mild  Goals      Patient Stated   . Increase physical activity (pt-stated)     Stay active       Fall Risk Fall Risk  07/11/2018 07/08/2017 07/14/2016 12/17/2014 12/07/2013  Falls in the past year? No No No No No   Depression Screen PHQ 2/9 Scores 07/11/2018 07/08/2017 07/14/2016 12/17/2014  PHQ - 2 Score 0 0 0 0    Cognitive Function MMSE - Mini Mental State Exam 07/11/2018  Orientation to time 5  Orientation to Place 5  Registration 3  Attention/ Calculation 5  Recall 3  Language- name 2 objects 2  Language- repeat 1  Language- follow 3 step command 3  Language- read & follow direction 1  Write a sentence 1  Copy design 1  Total score 30     6CIT Screen 07/08/2017  What Year? 0 points  What month? 0 points  What time? 0 points  Count back from 20 0 points  Months in reverse 0 points  Repeat phrase 0 points  Total Score 0    Immunization History  Administered Date(s) Administered  . Influenza Split 09/11/2011, 08/15/2012  . Influenza, High Dose Seasonal PF 07/21/2017  . Influenza,inj,Quad PF,6+ Mos 08/25/2013, 10/10/2014, 09/03/2015, 07/29/2016  . Pneumococcal Conjugate-13 12/07/2013  . Pneumococcal Polysaccharide-23 06/05/2009  . Zoster 09/22/2010   Screening  Tests Health Maintenance  Topic Date Due  . TETANUS/TDAP  07/20/1956  . PNA vac Low Risk Adult  Completed  . INFLUENZA VACCINE  Discontinued      Plan:    End of life planning; Advance aging; Advanced directives discussed. Copy of current HCPOA/Living Will requested.     I have personally reviewed and noted the following in the patient's chart:   . Medical and social history . Use of alcohol, tobacco or illicit drugs  . Current medications and supplements . Functional ability and status . Nutritional status . Physical activity . Advanced directives . List of other physicians . Hospitalizations, surgeries, and ER visits in previous 12 months . Vitals . Screenings to include cognitive, depression, and falls . Referrals and appointments  In addition, I have reviewed and discussed with patient certain preventive protocols, quality metrics, and best practice recommendations. A written personalized care plan for preventive services as well as general preventive health recommendations were provided to patient.     Benjamin Carney, Benjamin Crafts Carney, Benjamin Carney  [id number]   I have reviewed the above information and agree with above.   Deborra Medina, MD

## 2018-07-12 ENCOUNTER — Ambulatory Visit (HOSPITAL_COMMUNITY)
Admission: RE | Admit: 2018-07-12 | Discharge: 2018-07-12 | Disposition: A | Discharge status: Home / Self Care | Payer: Medicare Other | Source: Ambulatory Visit | Attending: Cardiovascular Disease | Admitting: Cardiovascular Disease

## 2018-07-12 ENCOUNTER — Other Ambulatory Visit: Payer: Self-pay | Admitting: *Deleted

## 2018-07-12 DIAGNOSIS — R0609 Other forms of dyspnea: Secondary | ICD-10-CM | POA: Insufficient documentation

## 2018-07-12 DIAGNOSIS — E7800 Pure hypercholesterolemia, unspecified: Secondary | ICD-10-CM | POA: Diagnosis present

## 2018-07-12 LAB — MYOCARDIAL PERFUSION IMAGING
CHL CUP NUCLEAR SRS: 3
LVDIAVOL: 99 mL (ref 62–150)
LVSYSVOL: 46 mL
Peak HR: 93 {beats}/min
Rest HR: 68 {beats}/min
SDS: 1
SSS: 4
TID: 1.18

## 2018-07-12 MED ORDER — TECHNETIUM TC 99M TETROFOSMIN IV KIT
10.2000 | PACK | Freq: Once | INTRAVENOUS | Status: AC | PRN
  Administered 2018-07-12: 10.2 via INTRAVENOUS
  Filled 2018-07-12: qty 11

## 2018-07-12 MED ORDER — REGADENOSON 0.4 MG/5ML IV SOLN
0.4000 mg | Freq: Once | INTRAVENOUS | Status: AC
  Administered 2018-07-12: 0.4 mg via INTRAVENOUS

## 2018-07-12 MED ORDER — TECHNETIUM TC 99M TETROFOSMIN IV KIT
32.5000 | PACK | Freq: Once | INTRAVENOUS | Status: AC | PRN
Start: 2018-07-12 — End: 2018-07-12
  Administered 2018-07-12: 32.5 via INTRAVENOUS
  Filled 2018-07-12: qty 33

## 2018-07-12 MED ORDER — FLUTICASONE PROPIONATE 50 MCG/ACT NA SUSP: 2.0000 | Freq: Every day | NASAL | 1 refills | Status: DC

## 2018-07-14 ENCOUNTER — Encounter: Payer: Self-pay | Admitting: Internal Medicine

## 2018-07-14 ENCOUNTER — Ambulatory Visit (INDEPENDENT_AMBULATORY_CARE_PROVIDER_SITE_OTHER): Payer: Medicare Other | Admitting: Internal Medicine

## 2018-07-14 VITALS — BP 130/74 | HR 62 | Temp 97.9°F | Resp 16 | Ht 68.0 in | Wt 201.5 lb

## 2018-07-14 DIAGNOSIS — Z23 Encounter for immunization: Secondary | ICD-10-CM

## 2018-07-14 DIAGNOSIS — H9190 Unspecified hearing loss, unspecified ear: Secondary | ICD-10-CM | POA: Diagnosis not present

## 2018-07-14 DIAGNOSIS — R0602 Shortness of breath: Secondary | ICD-10-CM | POA: Diagnosis not present

## 2018-07-14 DIAGNOSIS — E7800 Pure hypercholesterolemia, unspecified: Secondary | ICD-10-CM | POA: Diagnosis not present

## 2018-07-14 MED ORDER — ATORVASTATIN CALCIUM 10 MG PO TABS: 10.0000 mg | ORAL_TABLET | Freq: Every day | ORAL | 2 refills | Status: DC

## 2018-07-14 NOTE — Progress Notes (Signed)
[name], male   DOB: [birth year], 83 y.o.   MRN: [id number]   Subjective:    [name], male    DOB: 1935/09/19, 83 y.o.   MRN: [id number]  HPI  Patient here for a scheduled follow up.  Was recently evaluated for DOE.  See my last note.  Was referred to cardiology.  Saw Dr Patsy Lager.  Had nuclear stress test - normal myoview with EF 54%.  Planning for echo 07/18/18.  No chest pain.  Still with some sob, but overall feels better.  Discussed if cardiac w/up unrevealing, pulmonary evaluation.  No acid reflux.  No abdominal pain.  Bowels moving. Handling stress.  Overall doing better.  Discussed calculated cholesterol risk.  Discussed starting cholesterol medication.  He is in agreement.  Back to playing golf.     Past Medical History:  Diagnosis Date  . Acoustic neuroma ([place])   . History of colonic polyps   . History of craniotomy   . Hypercholesterolemia   . Macular degeneration   . Psoriasis   . Rheumatic fever age 30  . Rosacea   . Scarlet fever age 71   Past Surgical History:  Procedure Laterality Date  . APPENDECTOMY  1959  . BUNIONECTOMY    . COCHLEAR IMPLANT     acute hearing loss  . CRANIECTOMY FOR EXCISION OF ACOUSTIC NEUROMA  1994  . craniotomy for meningoma     Family History  Problem Relation Age of Onset  . Hypertension Mother   . Heart disease Mother   . Heart failure Mother   . Cancer Father 18       prostate  . Stroke Father   . Heart disease Brother 44       CABG  . Stroke Son   . Colon cancer Neg Hx    Social History   Socioeconomic History  . Marital status: Divorced    Spouse name: Not on file  . Number of children: 3  . Years of education: Not on file  . Highest education level: Not on file  Occupational History  . Not on file  Social Needs  . Financial resource strain: Not hard at all  . Food insecurity:    Worry: Never true    Inability: Never true  . Transportation needs:    Medical: No    Non-medical:  No  Tobacco Use  . Smoking status: Former Smoker    Packs/day: 1.50    Years: 40.00    Pack years: 60.00    Last attempt to quit: 10/05/2000    Years since quitting: 17.7  . Smokeless tobacco: Never Used  Substance and Sexual Activity  . Alcohol use: Yes    Alcohol/week: 0.0 standard drinks    Comment: occasional wine  . Drug use: No  . Sexual activity: Not on file  Lifestyle  . Physical activity:    Days per week: 0 days    Minutes per session: Not on file  . Stress: Not at all  Relationships  . Social connections:    Talks on phone: Not on file    Gets together: Not on file    Attends religious service: Not on file    Active member of club or organization: Not on file    Attends meetings of clubs or organizations: Not on file    Relationship status: Not on file  Other Topics Concern  . Not on file  Social History Narrative  .  Not on file    Outpatient Encounter Medications as of 07/14/2018  Medication Sig  . aspirin EC 81 MG tablet Take 81 mg by mouth daily.  . fluticasone (FLONASE) 50 MCG/ACT nasal spray Place 2 sprays into both nostrils daily.  . Multiple Vitamins-Minerals (VISION-VITE PRESERVE PO) Take by mouth.  . timolol (TIMOPTIC) 0.5 % ophthalmic solution Place 1 drop into both eyes daily.  Marland Kitchen triamcinolone cream (KENALOG) 0.1 % Apply 1 application topically as needed (for Rosacea).  Marland Kitchen atorvastatin (LIPITOR) 10 MG tablet Take 1 tablet (10 mg total) by mouth daily.   No facility-administered encounter medications on file as of 07/14/2018.     Review of Systems  Constitutional: Negative for appetite change and unexpected weight change.  HENT: Negative for congestion and sinus pressure.   Respiratory: Positive for shortness of breath. Negative for cough and chest tightness.   Cardiovascular: Negative for chest pain, palpitations and leg swelling.  Gastrointestinal: Negative for abdominal pain, diarrhea and nausea.  Genitourinary: Negative for difficulty  urinating and dysuria.  Musculoskeletal: Negative for joint swelling and myalgias.  Skin: Negative for color change and rash.  Neurological: Negative for dizziness, light-headedness and headaches.  Psychiatric/Behavioral: Negative for agitation and dysphoric mood.       Objective:    Physical Exam  Constitutional: He appears well-developed and well-nourished. No distress.  HENT:  Nose: Nose normal.  Mouth/Throat: Oropharynx is clear and moist.  Neck: Neck supple.  Cardiovascular: Normal rate and regular rhythm.  Pulmonary/Chest: Effort normal and breath sounds normal. No respiratory distress.  Abdominal: Soft. Bowel sounds are normal. There is no tenderness.  Musculoskeletal: He exhibits no edema or tenderness.  Lymphadenopathy:    He has no cervical adenopathy.  Skin: No rash noted. No erythema.  Psychiatric: He has a normal mood and affect. His behavior is normal.    BP 130/74 (BP Location: Left Arm, Patient Position: Sitting, Cuff Size: Large)   Pulse 62   Temp 97.9 F (36.6 C) (Oral)   Resp 16   Ht 5\' 8"  (1.727 m)   Wt 201 lb 8 oz (91.4 kg)   SpO2 97%   BMI 30.64 kg/m  Wt Readings from Last 3 Encounters:  07/14/18 201 lb 8 oz (91.4 kg)  07/12/18 199 lb (90.3 kg)  07/11/18 202 lb (91.6 kg)     Lab Results  Component Value Date   WBC 4.6 01/20/2018   HGB 15.8 01/20/2018   HCT 47.2 01/20/2018   PLT 157.0 01/20/2018   GLUCOSE 94 05/18/2018   CHOL 206 (H) 05/18/2018   TRIG 278.0 (H) 05/18/2018   HDL 32.70 (L) 05/18/2018   LDLDIRECT 149.0 05/18/2018   LDLCALC 141 (H) 01/20/2018   ALT 40 05/18/2018   AST 27 05/18/2018   NA 142 05/18/2018   K 4.6 05/18/2018   CL 105 05/18/2018   CREATININE 1.11 05/18/2018   BUN 20 05/18/2018   CO2 31 05/18/2018   TSH 2.03 01/20/2018   PSA 0.39 01/20/2018       Assessment & Plan:   Problem List Items Addressed This Visit    Hearing loss    S/p cochlear implant.  Followed by ENT.       Hypercholesterolemia -  Primary    Discussed calculated cholesterol risk.  Discussed starting cholesterol medication.  He has declined previously.  Agrees.  Start lipitor 10mg  q day.  Check liver panel in 6 weeks.  Low cholesterol diet and exercise.        Relevant  Medications   atorvastatin (LIPITOR) 10 MG tablet   Other Relevant Orders   Hepatic function panel   SOB (shortness of breath) on exertion    W/up in progress.  Normal myoview recently.  Planning for echo 07/18/18.  Has f/u planned with cardiology.  If cardiac w/up unrevealing, plan pulmonary evaluation.         Other Visit Diagnoses    Encounter for immunization       Relevant Orders   Flu vaccine HIGH DOSE PF (Completed)       Einar Pheasant, MD

## 2018-07-17 ENCOUNTER — Encounter: Payer: Self-pay | Admitting: Internal Medicine

## 2018-07-17 NOTE — Assessment & Plan Note (Signed)
Discussed calculated cholesterol risk.  Discussed starting cholesterol medication.  He has declined previously.  Agrees.  Start lipitor 10mg  q day.  Check liver panel in 6 weeks.  Low cholesterol diet and exercise.

## 2018-07-17 NOTE — Assessment & Plan Note (Signed)
S/p cochlear implant.  Followed by ENT.

## 2018-07-17 NOTE — Assessment & Plan Note (Signed)
W/up in progress.  Normal myoview recently.  Planning for echo 07/18/18.  Has f/u planned with cardiology.  If cardiac w/up unrevealing, plan pulmonary evaluation.

## 2018-07-18 ENCOUNTER — Ambulatory Visit (HOSPITAL_COMMUNITY): Payer: Medicare Other | Attending: Cardiology

## 2018-07-18 ENCOUNTER — Other Ambulatory Visit: Payer: Self-pay

## 2018-07-18 DIAGNOSIS — E7800 Pure hypercholesterolemia, unspecified: Secondary | ICD-10-CM | POA: Diagnosis present

## 2018-07-18 DIAGNOSIS — R0609 Other forms of dyspnea: Secondary | ICD-10-CM

## 2018-07-19 ENCOUNTER — Telehealth: Payer: Self-pay | Admitting: Cardiology

## 2018-07-19 DIAGNOSIS — R0600 Dyspnea, unspecified: Secondary | ICD-10-CM

## 2018-07-19 NOTE — Telephone Encounter (Addendum)
Pt given echo results, orders for PFT's placed, message sent to [facility] scheduler, FYI C. Pugh  LPN

## 2018-07-19 NOTE — Telephone Encounter (Signed)
New Message:   Patient returning call regarding results.

## 2018-07-20 ENCOUNTER — Telehealth: Payer: Self-pay | Admitting: *Deleted

## 2018-07-20 NOTE — Telephone Encounter (Signed)
Spoke with patient regarding appointment for PFT's ordered by Dr. Martinique.  Schjeduled for 07/28/18 at 10:00am at [facility].  I will mail information to patient.

## 2018-07-28 ENCOUNTER — Ambulatory Visit (HOSPITAL_COMMUNITY)
Admission: RE | Admit: 2018-07-28 | Discharge: 2018-07-28 | Disposition: A | Discharge status: Home / Self Care | Payer: Medicare Other | Source: Ambulatory Visit | Attending: Cardiology | Admitting: Cardiology

## 2018-07-28 DIAGNOSIS — R0600 Dyspnea, unspecified: Secondary | ICD-10-CM | POA: Diagnosis not present

## 2018-07-28 LAB — PULMONARY FUNCTION TEST
DL/VA % pred: 90 %
DL/VA: 4.03 ml/min/mmHg/L
DLCO unc % pred: 62 %
DLCO unc: 18.42 ml/min/mmHg
FEF 25-75 PRE: 2.03 L/s
FEF2575-%PRED-PRE: 116 %
FEV1-%PRED-PRE: 84 %
FEV1-PRE: 2.18 L
FEV1FVC-%Pred-Pre: 109 %
FEV6-%PRED-PRE: 81 %
FEV6-Pre: 2.78 L
FEV6FVC-%Pred-Pre: 107 %
FVC-%Pred-Pre: 76 %
FVC-Pre: 2.79 L
PRE FEV1/FVC RATIO: 78 %
Pre FEV6/FVC Ratio: 99 %
RV % PRED: 78 %
RV: 2.02 L
TLC % pred: 75 %
TLC: 5.02 L

## 2018-07-29 ENCOUNTER — Other Ambulatory Visit: Payer: Self-pay

## 2018-07-29 DIAGNOSIS — R0602 Shortness of breath: Secondary | ICD-10-CM

## 2018-07-29 DIAGNOSIS — R942 Abnormal results of pulmonary function studies: Secondary | ICD-10-CM

## 2018-07-29 NOTE — Progress Notes (Signed)
amb ref °

## 2018-08-11 ENCOUNTER — Ambulatory Visit (INDEPENDENT_AMBULATORY_CARE_PROVIDER_SITE_OTHER): Payer: Medicare Other | Admitting: Internal Medicine

## 2018-08-11 ENCOUNTER — Encounter: Payer: Self-pay | Admitting: Internal Medicine

## 2018-08-11 ENCOUNTER — Ambulatory Visit (INDEPENDENT_AMBULATORY_CARE_PROVIDER_SITE_OTHER)
Admission: RE | Admit: 2018-08-11 | Discharge: 2018-08-11 | Disposition: A | Discharge status: Home / Self Care | Payer: Medicare Other | Source: Ambulatory Visit | Attending: Internal Medicine | Admitting: Internal Medicine

## 2018-08-11 VITALS — BP 124/78 | HR 80 | Ht 68.0 in | Wt 197.6 lb

## 2018-08-11 DIAGNOSIS — R0602 Shortness of breath: Secondary | ICD-10-CM

## 2018-08-11 NOTE — Patient Instructions (Addendum)
To get the most out of exercise, you need to be continuously aware that you are short of breath, but never out of breath, for 30 minutes daily. As you improve, it will actually be easier for you to do the same amount of exercise  in  30 minutes so always push to the level where you are short of breath.     If your breathing does start to bother you with walking at fast pace, check your 02 levels toward the end of exercise and let us know if trending down less than 94% (your level today)   Please remember to go to the  x-ray department downstairs in the basement  for your tests - we will call you with the results when they are available.   Pulmonary follow up is as needed

## 2018-08-11 NOTE — Assessment & Plan Note (Addendum)
Echo 07/18/18 no elevation PAS  PFT's  08/11/2018  FEV1 2.18 (84 % ) ratio 78    p nothing prior to study with DLCO  62 % corrects to 90 % for alv volume   - 08/11/2018  Walked RA x 3 laps @ 185 ft each stopped due to  End of study, fast pace, no sob or desat - lowest sat 94%     DLCO corrects to nl and he does not have significant restriction on lung volumes or elevated PAS so no evidence of ILD or any other pulmonary  cause for doe which is likely related to mild obesity/ conditioning issues.   >>>  rec regular walking and track sats at end of ex and call if trending down below present levels over time and if so the next step would be HRCT    Total time devoted to counseling  > 50 % of initial 60 min office visit:  review case with pt/ discussion of options/alternatives/ personally creating written customized instructions  in presence of pt  then going over those specific  Instructions directly with the pt including how to use all of the meds but in particular covering each new medication in detail and the difference between the maintenance= "automatic" meds and the prns using an action plan format for the latter (If this problem/symptom => do that organization reading Left to right).  Please see AVS from this visit for a full list of these instructions which I personally wrote for this pt and  are unique to this visit.

## 2018-08-11 NOTE — Progress Notes (Signed)
Benjamin Carney, male    DOB: 1935/09/20    MRN: [id number]    Brief patient profile:  83 yowm quit smoking around 2002 due to cough/ resolved with new onset doe x 2016 referred to pulmonary clinic 08/11/2018 by Dr   Peter Martinique with concern re abn pfts ? PF but no copd    History of Present Illness  08/11/2018  Pulmonary/ 1st office eval/Dornell Grasmick  Chief Complaint  Patient presents with  . Pulmonary Consult    Referred by Dr. Martinique for eval of SOB and abnormal PFT's. PFT done 07/28/18. Pt c/o SOB with long walks or "heavy yard work".  He has occ cough in the evenings- usually non prod.     Dyspnea:  MMRC1 = can walk nl pace, flat grade, can't hurry or go uphills or steps s sob  / worse bending over eg yardwork Cough: minimal / mucoid in afternoons, never noct  Sleep: no resp c/o's flat/one pillow SABA use: none    No obvious day to day or daytime variability or assoc excess/ purulent sputum or mucus plugs or hemoptysis or cp or chest tightness, subjective wheeze or overt sinus or hb symptoms.   Sleeping as above  without nocturnal  or early am exacerbation  of respiratory  c/o's or need for noct saba. Also denies any obvious fluctuation of symptoms with weather or environmental changes or other aggravating or alleviating factors except as outlined above   No unusual exposure hx or h/o childhood pna/ asthma or knowledge of premature birth.  Current Allergies, Complete Past Medical History, Past Surgical History, Family History, and Social History were reviewed in [employer] record.  ROS  The following are not active complaints unless bolded Hoarseness, sore throat, dysphagia, dental problems, itching, sneezing,  nasal congestion or discharge of excess mucus or purulent secretions, ear ache,   fever, chills, sweats, unintended wt loss or wt gain, classically pleuritic or exertional cp,  orthopnea pnd or arm/hand swelling  or leg swelling, presyncope, palpitations,  abdominal pain, anorexia, nausea, vomiting, diarrhea  or change in bowel habits or change in bladder habits, change in stools or change in urine, dysuria, hematuria,  rash, arthralgias, visual complaints, headache, numbness, weakness or ataxia or problems with walking or coordination,  change in mood or  memory.           Past Medical History:  Diagnosis Date  . Acoustic neuroma ([place])   . History of colonic polyps   . History of craniotomy   . Hypercholesterolemia   . Macular degeneration   . Psoriasis   . Rheumatic fever age 83  . Rosacea   . Scarlet fever age 83    Outpatient Medications Prior to Visit  Medication Sig Dispense Refill  . aspirin EC 81 MG tablet Take 81 mg by mouth daily.    Marland Kitchen atorvastatin (LIPITOR) 10 MG tablet Take 1 tablet (10 mg total) by mouth daily. 30 tablet 2  . fluticasone (FLONASE) 50 MCG/ACT nasal spray Place 2 sprays into both nostrils daily. 48 g 1  . Multiple Vitamins-Minerals (VISION-VITE PRESERVE PO) Take by mouth.    . timolol (TIMOPTIC) 0.5 % ophthalmic solution Place 1 drop into both eyes daily.    Marland Kitchen triamcinolone cream (KENALOG) 0.1 % Apply 1 application topically as needed (for Rosacea).     No facility-administered medications prior to visit.      Objective:     BP 124/78 (BP Location: Left Arm, Cuff Size: Normal)  Pulse 80   Ht 5\' 8"  (1.727 m)   Wt 197 lb 9.6 oz (89.6 kg)   SpO2 96%   BMI 30.04 kg/m   SpO2: 96 %  RA    HEENT:  Top dentures/ lower partial dentures with mild nonspecific swelling of both  Turbinates R> L with watery secretions bilaterally, and oropharynx. Nl external ear canals without cough reflex   NECK :  without JVD/Nodes/TM/ nl carotid upstrokes bilaterally   LUNGS: no acc muscle use,  Nl contour chest which is clear to A and P bilaterally without cough on insp or exp maneuvers   CV:  RRR  no s3 or murmur or increase in P2, and no edema   ABD:  soft and nontender with nl inspiratory excursion in the  supine position. No bruits or organomegaly appreciated, bowel sounds nl  MS:  Nl gait/ ext warm without deformities, calf tenderness, cyanosis or clubbing No obvious joint restrictions   SKIN: warm and dry without lesions    NEURO:  alert, approp, nl sensorium with  no motor or cerebellar deficits apparent.      CXR PA and Lateral:   08/11/2018 :    I personally reviewed images - impression as follows:   wnl         Assessment   SOB (shortness of breath) on exertion Echo 07/18/18 no elevation PAS  PFT's  08/11/2018  FEV1 2.18 (84 % ) ratio 78    p nothing prior to study with DLCO  62 % corrects to 90 % for alv volume   - 08/11/2018  Walked RA x 3 laps @ 185 ft each stopped due to  End of study, fast pace, no sob or desat - lowest sat 94%     DLCO corrects to nl and he does not have significant restriction on lung volumes or elevated PAS so no evidence of ILD or any other pulmonary  cause for doe which is likely related to mild obesity/ conditioning issues.   >>>  rec regular walking and track sats at end of ex and call if trending down below present levels over time and if so the next step would be HRCT    Total time devoted to counseling  > 50 % of initial 60 min office visit:  review case with pt/ discussion of options/alternatives/ personally creating written customized instructions  in presence of pt  then going over those specific  Instructions directly with the pt including how to use all of the meds but in particular covering each new medication in detail and the difference between the maintenance= "automatic" meds and the prns using an action plan format for the latter (If this problem/symptom => do that organization reading Left to right).  Please see AVS from this visit for a full list of these instructions which I personally wrote for this pt and  are unique to this visit.        Christinia Gully, MD 08/11/2018

## 2018-08-11 NOTE — Progress Notes (Signed)
Spoke with pt and notified of results per Dr. Wert. Pt verbalized understanding and denied any questions. 

## 2018-08-18 ENCOUNTER — Encounter: Payer: Self-pay | Admitting: Cardiology

## 2018-08-25 ENCOUNTER — Other Ambulatory Visit (INDEPENDENT_AMBULATORY_CARE_PROVIDER_SITE_OTHER): Payer: Medicare Other

## 2018-08-25 DIAGNOSIS — E7800 Pure hypercholesterolemia, unspecified: Secondary | ICD-10-CM | POA: Diagnosis not present

## 2018-08-25 LAB — HEPATIC FUNCTION PANEL
ALBUMIN: 3.8 g/dL (ref 3.5–5.2)
ALK PHOS: 91 U/L (ref 39–117)
ALT: 45 U/L (ref 0–53)
AST: 26 U/L (ref 0–37)
BILIRUBIN DIRECT: 0 mg/dL (ref 0.0–0.3)
TOTAL PROTEIN: 6.8 g/dL (ref 6.0–8.3)
Total Bilirubin: 0.6 mg/dL (ref 0.2–1.2)

## 2018-08-26 ENCOUNTER — Encounter: Payer: Self-pay | Admitting: Internal Medicine

## 2018-09-02 NOTE — Progress Notes (Signed)
Cardiology Office Note   Date:  09/07/2018   ID:  Benjamin, Carney November 12, 1934, MRN [id number]  PCP:  Benjamin Pheasant, MD  Cardiologist:    Martinique, MD   Chief Complaint  Patient presents with  . Follow-up      History of Present Illness: Benjamin Carney is a 83 y.o. male who is seen for follow up  of dyspnea on exertion. He has a history of HLD. He is a former smoker but quit 20 yrs ago. He reports now a one year history of dyspnea on exertion. No dyspnea at rest. No PND or orthopnea. Denies any chest pain. Mild ankle swelling in the evening. He is concerned because his brother had to have emergent CABG last year. He does note a dry hack sometimes. His dyspnea resolves with rest but often takes an hour to recover and then he feels weak. His is fairly sedentary but does play golf and does a little yard work.   To evaluate further he had a Myoview study that was normal. Echo showed grade 1 diastolic dysfunction, otherwise normal. PFTs ordered and patient seen by Dr. Melvyn Novas. Results as noted below. Regular exercise program recommended. If symptoms worsen or he desaturates with exertion consider high Res CT.   On follow up today he is doing ok. Admits he is a "couch potato" but is trying to do more. He has lost 5 lbs. No increase in SOB.     Past Medical History:  Diagnosis Date  . Acoustic neuroma ([place])   . History of colonic polyps   . History of craniotomy   . Hypercholesterolemia   . Macular degeneration   . Psoriasis   . Rheumatic fever age 24  . Rosacea   . Scarlet fever age 68    Past Surgical History:  Procedure Laterality Date  . APPENDECTOMY  1959  . BUNIONECTOMY    . COCHLEAR IMPLANT     acute hearing loss  . CRANIECTOMY FOR EXCISION OF ACOUSTIC NEUROMA  1994  . craniotomy for meningoma       Current Outpatient Medications  Medication Sig Dispense Refill  . aspirin EC 81 MG tablet Take 81 mg by mouth daily.    Marland Kitchen atorvastatin (LIPITOR) 10 MG tablet  Take 1 tablet (10 mg total) by mouth daily. 30 tablet 2  . fluticasone (FLONASE) 50 MCG/ACT nasal spray Place 2 sprays into both nostrils daily. 48 g 1  . Multiple Vitamins-Minerals (VISION-VITE PRESERVE PO) Take by mouth.    . timolol (TIMOPTIC) 0.5 % ophthalmic solution Place 1 drop into both eyes daily.    Marland Kitchen triamcinolone cream (KENALOG) 0.1 % Apply 1 application topically as needed (for Rosacea).     No current facility-administered medications for this visit.     Allergies:   Crestor [rosuvastatin calcium] and Metronidazole    Social History:  The patient  reports that he quit smoking about 17 years ago. He has a 60.00 pack-year smoking history. He has never used smokeless tobacco. He reports that he drinks alcohol. He reports that he does not use drugs.   Family History:  The patient's family history includes Cancer (age of onset: 23) in his father; Heart disease in his mother; Heart disease (age of onset: 89) in his brother; Heart failure in his mother; Hypertension in his mother; Stroke in his father and son.    ROS:  Please see the history of present illness.   Otherwise, review of systems are positive for  none.   All other systems are reviewed and negative.    PHYSICAL EXAM: VS:  BP (!) 144/73   Pulse 90   Ht 5\' 8"  (1.727 m)   Wt 195 lb 3.2 oz (88.5 kg)   BMI 29.68 kg/m  , BMI Body mass index is 29.68 kg/m. GEN: Well nourished, well developed, in no acute distress  HEENT: normal  Neck:  JVD to 5 cm, carotid bruits, or masses Cardiac: RRR; no murmurs, rubs, or gallops,no edema  Respiratory:  clear to auscultation bilaterally, normal work of breathing GI: soft, nontender, nondistended, + BS MS: no deformity or atrophy  Skin: warm and dry, no rash Neuro:  Strength and sensation are intact Psych: euthymic mood, full affect   EKG:  EKG is not ordered today.    Recent Labs: 01/20/2018: Hemoglobin 15.8; Platelets 157.0; TSH 2.03 05/18/2018: BUN 20; Creatinine, Ser 1.11;  Potassium 4.6; Sodium 142 08/25/2018: ALT 45    Lipid Panel    Component Value Date/Time   CHOL 206 (H) 05/18/2018 0806   TRIG 278.0 (H) 05/18/2018 0806   HDL 32.70 (L) 05/18/2018 0806   CHOLHDL 6 05/18/2018 0806   VLDL 55.6 (H) 05/18/2018 0806   LDLCALC 141 (H) 01/20/2018 0833   LDLDIRECT 149.0 05/18/2018 0806      Wt Readings from Last 3 Encounters:  09/07/18 195 lb 3.2 oz (88.5 kg)  08/11/18 197 lb 9.6 oz (89.6 kg)  07/14/18 201 lb 8 oz (91.4 kg)      Other studies Reviewed: Additional studies/ records that were reviewed today include: CHEST - 2 VIEW  COMPARISON:  No prior.  FINDINGS: Mediastinum and hilar structures normal. Cardiomegaly with normal pulmonary vascularity. Low lung volumes with mild basilar atelectasis. No pleural effusion or pneumothorax.  IMPRESSION: Number low lung volumes with mild basilar atelectasis.  2.  Mild cardiomegaly.  No pulmonary venous congestion.   Electronically Signed   By: Marcello Moores  Register   On: 01/20/2018 10:12  Myoview 07/12/18: Study Highlights    Nuclear stress EF: 54%. The left ventricular ejection fraction is mildly decreased (45-54%).  There was no ST segment deviation noted during stress.  The study is normal.  This is a low risk study. There is no evidence of ischemia or previous MI   Echo 07/18/18: Study Conclusions  - Left ventricle: The cavity size was normal. Systolic function was   normal. The estimated ejection fraction was in the range of 60%   to 65%. Wall motion was normal; there were no regional wall   motion abnormalities. Doppler parameters are consistent with   abnormal left ventricular relaxation (grade 1 diastolic   dysfunction). There was no evidence of elevated ventricular   filling pressure by Doppler parameters. - Aortic valve: Trileaflet; mildly thickened, mildly calcified   leaflets. There was no regurgitation. - Aortic root: The aortic root was normal in size. - Mitral valve:  There was mild regurgitation. - Left atrium: The atrium was mildly dilated. - Right ventricle: Systolic function was normal. - Right atrium: The atrium was normal in size. - Tricuspid valve: There was no regurgitation. - Pulmonary arteries: Systolic pressure was within the normal   range. - Inferior vena cava: The vessel was normal in size. The   respirophasic diameter changes were in the normal range (= 50%),   consistent with normal central venous pressure. - Pericardium, extracardiac: There was no pericardial effusion.  Impressions:  - Normal global longitudinal strain -19.4 %.   Pulmonary evaluation: PFT's  08/11/2018  FEV1 2.18 (84 % ) ratio 78    p nothing prior to study with DLCO  62 % corrects to 90 % for alv volume   - 08/11/2018  Walked RA x 3 laps @ 185 ft each stopped due to  End of study, fast pace, no sob or desat - lowest sat 94%     DLCO corrects to nl and he does not have significant restriction on lung volumes or elevated PAS so no evidence of ILD or any other pulmonary  cause for doe which is likely related to mild obesity/ conditioning issues.  ASSESSMENT AND PLAN:  1.  Dyspnea on exertion. Based on the above evaluation if appears that this is predominantly due to deconditioning and weight. Discussed the importance of consistent and regular aerobic exercise. I feel he can really improve on this with regular exercise.  2. Hypercholesterolemia. Now on lipitor. Followed by primary care.  Current medicines are reviewed at length with the patient today.  The patient does not have concerns regarding medicines.  The following changes have been made:  no change  Labs/ tests ordered today include:  No orders of the defined types were placed in this encounter.    Disposition:   FU with me as needed.  Signed,  Martinique, MD  09/07/2018 1:22 PM    [facility] [address] [place], [place], [address] Phone [phone], Fax  [phone]

## 2018-09-06 ENCOUNTER — Institutional Professional Consult (permissible substitution): Payer: Medicare Other | Admitting: Pulmonary Disease

## 2018-09-07 ENCOUNTER — Ambulatory Visit (INDEPENDENT_AMBULATORY_CARE_PROVIDER_SITE_OTHER): Payer: Medicare Other | Admitting: Cardiology

## 2018-09-07 ENCOUNTER — Encounter: Payer: Self-pay | Admitting: Cardiology

## 2018-09-07 VITALS — BP 144/73 | HR 90 | Ht 68.0 in | Wt 195.2 lb

## 2018-09-07 DIAGNOSIS — R0602 Shortness of breath: Secondary | ICD-10-CM | POA: Diagnosis not present

## 2018-09-07 DIAGNOSIS — E7800 Pure hypercholesterolemia, unspecified: Secondary | ICD-10-CM

## 2018-09-07 DIAGNOSIS — R0609 Other forms of dyspnea: Secondary | ICD-10-CM | POA: Diagnosis not present

## 2018-10-12 ENCOUNTER — Ambulatory Visit (INDEPENDENT_AMBULATORY_CARE_PROVIDER_SITE_OTHER): Payer: Medicare Other | Admitting: Internal Medicine

## 2018-10-12 ENCOUNTER — Encounter: Payer: Self-pay | Admitting: Internal Medicine

## 2018-10-12 VITALS — BP 130/76 | HR 68 | Temp 97.5°F | Resp 18 | Wt 193.0 lb

## 2018-10-12 DIAGNOSIS — D696 Thrombocytopenia, unspecified: Secondary | ICD-10-CM

## 2018-10-12 DIAGNOSIS — E7800 Pure hypercholesterolemia, unspecified: Secondary | ICD-10-CM | POA: Diagnosis not present

## 2018-10-12 DIAGNOSIS — R0602 Shortness of breath: Secondary | ICD-10-CM | POA: Diagnosis not present

## 2018-10-12 LAB — HEPATIC FUNCTION PANEL
ALBUMIN: 4.4 g/dL (ref 3.5–5.2)
ALT: 29 U/L (ref 0–53)
AST: 24 U/L (ref 0–37)
Alkaline Phosphatase: 91 U/L (ref 39–117)
Bilirubin, Direct: 0.2 mg/dL (ref 0.0–0.3)
Total Bilirubin: 1.1 mg/dL (ref 0.2–1.2)
Total Protein: 6.9 g/dL (ref 6.0–8.3)

## 2018-10-12 LAB — BASIC METABOLIC PANEL
BUN: 14 mg/dL (ref 6–23)
CALCIUM: 9.3 mg/dL (ref 8.4–10.5)
CO2: 29 meq/L (ref 19–32)
Chloride: 103 mEq/L (ref 96–112)
Creatinine, Ser: 0.86 mg/dL (ref 0.40–1.50)
GFR: 90.66 mL/min (ref >60.00)
GLUCOSE: 92 mg/dL (ref 70–99)
Potassium: 4 mEq/L (ref 3.5–5.1)
Sodium: 138 mEq/L (ref 135–145)

## 2018-10-12 LAB — LIPID PANEL
Cholesterol: 197 mg/dL (ref 0–200)
HDL: 31.9 mg/dL — ABNORMAL LOW (ref >39.00)
NONHDL: 165.13
TRIGLYCERIDES: 293 mg/dL — AB (ref 0.0–149.0)
Total CHOL/HDL Ratio: 6
VLDL: 58.6 mg/dL — ABNORMAL HIGH (ref 0.0–40.0)

## 2018-10-12 LAB — LDL CHOLESTEROL, DIRECT: Direct LDL: 135 mg/dL

## 2018-10-12 NOTE — Progress Notes (Signed)
[name], male   DOB: [birth year], 84 y.o.   MRN: [id number]   Subjective:    [name], male    DOB: 1935-05-12, 84 y.o.   MRN: [id number]  HPI  Patient here for a scheduled follow up.  States he feels better.  Has been exercising - walking.  Eating better.  Watching his diet.  Did not tolerate lipitor.  Off since second week of 09/2018.  Feels better off.  No chest pain.  Breathing better.  No acid reflux. No abdominal pain.  Bowels moving.  Saw cardiology.  myoview ok.     Past Medical History:  Diagnosis Date  . Acoustic neuroma ([place])   . History of colonic polyps   . History of craniotomy   . Hypercholesterolemia   . Macular degeneration   . Psoriasis   . Rheumatic fever age 41  . Rosacea   . Scarlet fever age 23   Past Surgical History:  Procedure Laterality Date  . APPENDECTOMY  1959  . BUNIONECTOMY    . COCHLEAR IMPLANT     acute hearing loss  . CRANIECTOMY FOR EXCISION OF ACOUSTIC NEUROMA  1994  . craniotomy for meningoma     Family History  Problem Relation Age of Onset  . Hypertension Mother   . Heart disease Mother   . Heart failure Mother   . Cancer Father 6       prostate  . Stroke Father   . Heart disease Brother 14       CABG  . Stroke Son   . Colon cancer Neg Hx    Social History   Socioeconomic History  . Marital status: Divorced    Spouse name: Not on file  . Number of children: 3  . Years of education: Not on file  . Highest education level: Not on file  Occupational History  . Not on file  Social Needs  . Financial resource strain: Not hard at all  . Food insecurity:    Worry: Never true    Inability: Never true  . Transportation needs:    Medical: No    Non-medical: No  Tobacco Use  . Smoking status: Former Smoker    Packs/day: 1.50    Years: 40.00    Pack years: 60.00    Last attempt to quit: 10/05/2000    Years since quitting: 18.0  . Smokeless tobacco: Never Used  Substance and Sexual  Activity  . Alcohol use: Yes    Alcohol/week: 0.0 standard drinks    Comment: occasional wine  . Drug use: No  . Sexual activity: Not on file  Lifestyle  . Physical activity:    Days per week: 0 days    Minutes per session: Not on file  . Stress: Not at all  Relationships  . Social connections:    Talks on phone: Not on file    Gets together: Not on file    Attends religious service: Not on file    Active member of club or organization: Not on file    Attends meetings of clubs or organizations: Not on file    Relationship status: Not on file  Other Topics Concern  . Not on file  Social History Narrative  . Not on file    Outpatient Encounter Medications as of 10/12/2018  Medication Sig  . aspirin EC 81 MG tablet Take 81 mg by mouth daily.  . fluticasone (FLONASE) 50 MCG/ACT nasal spray Place  2 sprays into both nostrils daily.  . Multiple Vitamins-Minerals (VISION-VITE PRESERVE PO) Take by mouth.  . timolol (TIMOPTIC) 0.5 % ophthalmic solution Place 1 drop into both eyes daily.  Marland Kitchen triamcinolone cream (KENALOG) 0.1 % Apply 1 application topically as needed (for Rosacea).  . [DISCONTINUED] atorvastatin (LIPITOR) 10 MG tablet Take 1 tablet (10 mg total) by mouth daily.   No facility-administered encounter medications on file as of 10/12/2018.     Review of Systems  Constitutional: Negative for appetite change and unexpected weight change.  HENT: Negative for congestion and sinus pressure.   Respiratory: Negative for cough, chest tightness and shortness of breath.   Cardiovascular: Negative for chest pain, palpitations and leg swelling.  Gastrointestinal: Negative for abdominal pain, diarrhea, nausea and vomiting.  Genitourinary: Negative for difficulty urinating and dysuria.  Musculoskeletal: Negative for joint swelling and myalgias.  Skin: Negative for color change and rash.  Neurological: Negative for dizziness, light-headedness and headaches.  Psychiatric/Behavioral:  Negative for agitation and dysphoric mood.       Objective:    Physical Exam Constitutional:      General: He is not in acute distress.    Appearance: Normal appearance. He is well-developed.  HENT:     Nose: Nose normal. No congestion.     Mouth/Throat:     Pharynx: No oropharyngeal exudate or posterior oropharyngeal erythema.  Neck:     Musculoskeletal: Neck supple. No muscular tenderness.  Cardiovascular:     Rate and Rhythm: Normal rate and regular rhythm.  Pulmonary:     Effort: Pulmonary effort is normal. No respiratory distress.     Breath sounds: Normal breath sounds.  Abdominal:     General: Bowel sounds are normal.     Palpations: Abdomen is soft.     Tenderness: There is no abdominal tenderness.  Musculoskeletal:        General: No swelling or tenderness.  Lymphadenopathy:     Cervical: No cervical adenopathy.  Skin:    Findings: No erythema or rash.  Neurological:     Mental Status: He is alert.  Psychiatric:        Mood and Affect: Mood normal.        Behavior: Behavior normal.     BP 130/76 (BP Location: Left Arm, Patient Position: Sitting, Cuff Size: Normal)   Pulse 68   Temp (!) 97.5 F (36.4 C) (Oral)   Resp 18   Wt 193 lb (87.5 kg)   SpO2 96%   BMI 29.35 kg/m  Wt Readings from Last 3 Encounters:  10/12/18 193 lb (87.5 kg)  09/07/18 195 lb 3.2 oz (88.5 kg)  08/11/18 197 lb 9.6 oz (89.6 kg)     Lab Results  Component Value Date   WBC 4.6 01/20/2018   HGB 15.8 01/20/2018   HCT 47.2 01/20/2018   PLT 157.0 01/20/2018   GLUCOSE 92 10/12/2018   CHOL 197 10/12/2018   TRIG 293.0 (H) 10/12/2018   HDL 31.90 (L) 10/12/2018   LDLDIRECT 135.0 10/12/2018   LDLCALC 141 (H) 01/20/2018   ALT 29 10/12/2018   AST 24 10/12/2018   NA 138 10/12/2018   K 4.0 10/12/2018   CL 103 10/12/2018   CREATININE 0.86 10/12/2018   BUN 14 10/12/2018   CO2 29 10/12/2018   TSH 2.03 01/20/2018   PSA 0.39 01/20/2018    Dg Chest 2 View  Result Date:  08/11/2018 CLINICAL DATA:  Chronic shortness of breath over the last year. EXAM: CHEST - 2  VIEW COMPARISON:  01/20/2018 FINDINGS: Heart size is normal with some left ventricular prominence. Mild tortuosity of the aorta. Pulmonary vascularity is normal. The lungs are clear. No infiltrate, collapse or effusion. IMPRESSION: No active cardiopulmonary disease. Electronically Signed   By: Nelson Chimes M.D.   On: 08/11/2018 14:22       Assessment & Plan:   Problem List Items Addressed This Visit    Hypercholesterolemia - Primary    Did not tolerate crestor or lipitor.  Desires not to start another cholesterol medication.  Discussed zetia.  Wants to hold on starting a cholesterol medication.  Low cholesterol diet and exercise.  Follow lipid panel.       Relevant Orders   Lipid panel (Completed)   Hepatic function panel (Completed)   Basic metabolic panel (Completed)   SOB (shortness of breath) on exertion    ECHO - 07/2018.  PFTs - DLCO corrects to normal and no significant restriction on lung volumes or elevated PAS.  No evidence of ILD.  Breathing better.  Exercising.  Follow.        Thrombocytopenia ([place])    Follow cbc. Platelet count 01/20/18 wnl.           Einar Pheasant, MD

## 2018-10-16 ENCOUNTER — Encounter: Payer: Self-pay | Admitting: Internal Medicine

## 2018-10-16 NOTE — Assessment & Plan Note (Signed)
ECHO - 07/2018.  PFTs - DLCO corrects to normal and no significant restriction on lung volumes or elevated PAS.  No evidence of ILD.  Breathing better.  Exercising.  Follow.

## 2018-10-16 NOTE — Assessment & Plan Note (Addendum)
Follow cbc. Platelet count 01/20/18 wnl.

## 2018-10-16 NOTE — Assessment & Plan Note (Signed)
Did not tolerate crestor or lipitor.  Desires not to start another cholesterol medication.  Discussed zetia.  Wants to hold on starting a cholesterol medication.  Low cholesterol diet and exercise.  Follow lipid panel.

## 2019-01-16 ENCOUNTER — Ambulatory Visit: Admit: 2019-01-16 | Payer: Medicare Other | Admitting: Ophthalmology

## 2019-01-16 SURGERY — PHACOEMULSIFICATION, CATARACT, WITH IOL INSERTION
Anesthesia: Regional | Laterality: Left

## 2019-03-20 ENCOUNTER — Other Ambulatory Visit: Payer: Self-pay

## 2019-03-20 ENCOUNTER — Encounter: Payer: Self-pay | Admitting: *Deleted

## 2019-03-23 ENCOUNTER — Other Ambulatory Visit: Payer: Self-pay

## 2019-03-23 ENCOUNTER — Other Ambulatory Visit
Admission: RE | Admit: 2019-03-23 | Discharge: 2019-03-23 | Disposition: A | Discharge status: Home / Self Care | Payer: Medicare Other | Source: Ambulatory Visit | Attending: Ophthalmology | Admitting: Ophthalmology

## 2019-03-23 DIAGNOSIS — Z1159 Encounter for screening for other viral diseases: Secondary | ICD-10-CM | POA: Insufficient documentation

## 2019-03-23 NOTE — Discharge Instructions (Signed)

## 2019-03-24 LAB — NOVEL CORONAVIRUS, NAA (HOSP ORDER, SEND-OUT TO REF LAB; TAT 18-24 HRS): SARS-CoV-2, NAA: NOT DETECTED

## 2019-03-27 ENCOUNTER — Ambulatory Visit: Payer: Medicare Other | Admitting: Anesthesiology

## 2019-03-27 ENCOUNTER — Encounter
Admission: RE | Disposition: A | Discharge status: Home / Self Care | Payer: Self-pay | Source: Home / Self Care | Attending: Ophthalmology

## 2019-03-27 ENCOUNTER — Ambulatory Visit
Admission: RE | Admit: 2019-03-27 | Discharge: 2019-03-27 | Disposition: A | Discharge status: Home / Self Care | Payer: Medicare Other | Attending: Ophthalmology | Admitting: Ophthalmology

## 2019-03-27 ENCOUNTER — Other Ambulatory Visit: Payer: Self-pay

## 2019-03-27 DIAGNOSIS — Z87891 Personal history of nicotine dependence: Secondary | ICD-10-CM | POA: Insufficient documentation

## 2019-03-27 DIAGNOSIS — Z79899 Other long term (current) drug therapy: Secondary | ICD-10-CM | POA: Insufficient documentation

## 2019-03-27 DIAGNOSIS — Z7982 Long term (current) use of aspirin: Secondary | ICD-10-CM | POA: Diagnosis not present

## 2019-03-27 DIAGNOSIS — H2512 Age-related nuclear cataract, left eye: Secondary | ICD-10-CM | POA: Diagnosis present

## 2019-03-27 DIAGNOSIS — M1990 Unspecified osteoarthritis, unspecified site: Secondary | ICD-10-CM | POA: Diagnosis not present

## 2019-03-27 DIAGNOSIS — Z9621 Cochlear implant status: Secondary | ICD-10-CM | POA: Insufficient documentation

## 2019-03-27 HISTORY — DX: Dizziness and giddiness: R42

## 2019-03-27 HISTORY — DX: Presence of dental prosthetic device (complete) (partial): Z97.2

## 2019-03-27 HISTORY — PX: CATARACT EXTRACTION W/PHACO: SHX586

## 2019-03-27 HISTORY — DX: Cochlear implant status: Z96.21

## 2019-03-27 HISTORY — DX: Other complications of anesthesia, initial encounter: T88.59XA

## 2019-03-27 SURGERY — PHACOEMULSIFICATION, CATARACT, WITH IOL INSERTION
Anesthesia: Monitor Anesthesia Care | Site: Eye | Laterality: Left

## 2019-03-27 MED ORDER — LACTATED RINGERS IV SOLN: INTRAVENOUS | Status: DC

## 2019-03-27 MED ORDER — SODIUM HYALURONATE 23 MG/ML IO SOLN
INTRAOCULAR | Status: DC | PRN
  Administered 2019-03-27: 0.6 mL via INTRAOCULAR

## 2019-03-27 MED ORDER — LIDOCAINE HCL (PF) 2 % IJ SOLN
INTRAOCULAR | Status: DC | PRN
  Administered 2019-03-27: 1 mL via INTRAOCULAR

## 2019-03-27 MED ORDER — TETRACAINE HCL 0.5 % OP SOLN
1.0000 [drp] | OPHTHALMIC | Status: DC | PRN
  Administered 2019-03-27 (×3): 1 [drp] via OPHTHALMIC

## 2019-03-27 MED ORDER — ACETAMINOPHEN 325 MG PO TABS: 325.0000 mg | ORAL_TABLET | Freq: Once | ORAL | Status: DC

## 2019-03-27 MED ORDER — ARMC OPHTHALMIC DILATING DROPS
1.0000 "application " | OPHTHALMIC | Status: DC | PRN
  Administered 2019-03-27 (×3): 1 via OPHTHALMIC

## 2019-03-27 MED ORDER — MOXIFLOXACIN HCL 0.5 % OP SOLN
OPHTHALMIC | Status: DC | PRN
  Administered 2019-03-27: 0.2 mL via OPHTHALMIC

## 2019-03-27 MED ORDER — FENTANYL CITRATE (PF) 100 MCG/2ML IJ SOLN
INTRAMUSCULAR | Status: DC | PRN
  Administered 2019-03-27 (×2): 50 ug via INTRAVENOUS

## 2019-03-27 MED ORDER — EPINEPHRINE PF 1 MG/ML IJ SOLN
INTRAOCULAR | Status: DC | PRN
  Administered 2019-03-27: 09:00:00 87 mL via OPHTHALMIC

## 2019-03-27 MED ORDER — ACETAMINOPHEN 160 MG/5ML PO SOLN: 325.0000 mg | Freq: Once | ORAL | Status: DC

## 2019-03-27 MED ORDER — SODIUM HYALURONATE 10 MG/ML IO SOLN
INTRAOCULAR | Status: DC | PRN
  Administered 2019-03-27: 0.55 mL via INTRAOCULAR

## 2019-03-27 SURGICAL SUPPLY — 20 items
CANNULA ANT/CHMB 27G (MISCELLANEOUS) ×2 IMPLANT
CANNULA ANT/CHMB 27GA (MISCELLANEOUS) ×6 IMPLANT
DISSECTOR HYDRO NUCLEUS 50X22 (MISCELLANEOUS) ×3 IMPLANT
GLOVE SURG LX 7.5 STRW (GLOVE) ×4
GLOVE SURG LX STRL 7.5 STRW (GLOVE) ×1 IMPLANT
GLOVE SURG SYN 8.5  E (GLOVE) ×2
GLOVE SURG SYN 8.5 E (GLOVE) ×1 IMPLANT
GLOVE SURG SYN 8.5 PF PI (GLOVE) ×1 IMPLANT
GOWN STRL REUS W/ TWL LRG LVL3 (GOWN DISPOSABLE) ×2 IMPLANT
GOWN STRL REUS W/TWL LRG LVL3 (GOWN DISPOSABLE) ×4
LENS IOL ACRSF IQ ULTRA 18.0 (Intraocular Lens) IMPLANT
LENS IOL ACRYSOF IQ 18.0 (Intraocular Lens) ×3 IMPLANT
MARKER SKIN DUAL TIP RULER LAB (MISCELLANEOUS) ×3 IMPLANT
PACK DR. KING ARMS (PACKS) ×3 IMPLANT
PACK EYE AFTER SURG (MISCELLANEOUS) ×3 IMPLANT
PACK OPTHALMIC (MISCELLANEOUS) ×3 IMPLANT
SYR 3ML LL SCALE MARK (SYRINGE) ×3 IMPLANT
SYR TB 1ML LUER SLIP (SYRINGE) ×3 IMPLANT
WATER STERILE IRR 250ML POUR (IV SOLUTION) ×3 IMPLANT
WIPE NON LINTING 3.25X3.25 (MISCELLANEOUS) ×3 IMPLANT

## 2019-03-27 NOTE — Op Note (Signed)
OPERATIVE NOTE  Benjamin Carney [id number] 03/27/2019   PREOPERATIVE DIAGNOSIS:  Nuclear sclerotic cataract left eye.  H25.12   POSTOPERATIVE DIAGNOSIS:    Nuclear sclerotic cataract left eye.     PROCEDURE:  Phacoemusification with posterior chamber intraocular lens placement of the left eye   LENS:   Implant Name Type Inv. Item Serial No. Manufacturer Lot No. LRB No. Used Action  LENS IOL ACRYSOF IQ 18.0 - [id number] Intraocular Lens LENS IOL ACRYSOF IQ 18.0 [id number] ALCON  Left 1 Implanted       [id number] +18.0   ULTRASOUND TIME: 1 minutes 18 seconds.  CDE 12.30   SURGEON:  Benay Pillow, MD, MPH   ANESTHESIA:  Topical with tetracaine drops augmented with 1% preservative-free intracameral lidocaine.  ESTIMATED BLOOD LOSS: <1 mL   COMPLICATIONS:  None.   DESCRIPTION OF PROCEDURE:  The patient was identified in the holding room and transported to the operating room and placed in the supine position under the operating microscope.  The left eye was identified as the operative eye and it was prepped and draped in the usual sterile ophthalmic fashion.   A 1.0 millimeter clear-corneal paracentesis was made at the 5:00 position. 0.5 ml of preservative-free 1% lidocaine with epinephrine was injected into the anterior chamber.  The anterior chamber was filled with Healon 5 viscoelastic.  A 2.4 millimeter keratome was used to make a near-clear corneal incision at the 2:00 position.  A curvilinear capsulorrhexis was made with a cystotome and capsulorrhexis forceps.  Balanced salt solution was used to hydrodissect and hydrodelineate the nucleus.   Phacoemulsification was then used in stop and chop fashion to remove the lens nucleus and epinucleus.  The remaining cortex was then removed using the irrigation and aspiration handpiece. Healon was then placed into the capsular bag to distend it for lens placement.  A lens was then injected into the capsular bag.  The remaining viscoelastic was  aspirated.   Wounds were hydrated with balanced salt solution.  The anterior chamber was inflated to a physiologic pressure with balanced salt solution.  Intracameral vigamox 0.1 mL undiltued was injected into the eye and a drop placed onto the ocular surface.  No wound leaks were noted.  The patient was taken to the recovery room in stable condition without complications of anesthesia or surgery  Benay Pillow 03/27/2019, 8:57 AM

## 2019-03-27 NOTE — Anesthesia Preprocedure Evaluation (Signed)
Anesthesia Evaluation  Patient identified by MRN, date of birth, ID band Patient awake    Reviewed: Allergy & Precautions, H&P , NPO status , Patient's Chart, lab work & pertinent test results  Airway Mallampati: II  TM Distance: >3 FB Neck ROM: full    Dental no notable dental hx. (+) Upper Dentures   Pulmonary former smoker,    Pulmonary exam normal breath sounds clear to auscultation       Cardiovascular Normal cardiovascular exam Rhythm:regular Rate:Normal     Neuro/Psych    GI/Hepatic GERD  ,  Endo/Other    Renal/GU      Musculoskeletal   Abdominal   Peds  Hematology   Anesthesia Other Findings   Reproductive/Obstetrics                             Anesthesia Physical Anesthesia Plan  ASA: II  Anesthesia Plan: MAC   Post-op Pain Management:    Induction:   PONV Risk Score and Plan: 1 and Treatment may vary due to age or medical condition and Midazolam  Airway Management Planned:   Additional Equipment:   Intra-op Plan:   Post-operative Plan:   Informed Consent: I have reviewed the patients History and Physical, chart, labs and discussed the procedure including the risks, benefits and alternatives for the proposed anesthesia with the patient or authorized representative who has indicated his/her understanding and acceptance.       Plan Discussed with: CRNA  Anesthesia Plan Comments:         Anesthesia Quick Evaluation

## 2019-03-27 NOTE — Transfer of Care (Signed)
Immediate Anesthesia Transfer of Care Note  Patient: Benjamin Carney  Procedure(s) Performed: CATARACT EXTRACTION PHACO AND INTRAOCULAR LENS PLACEMENT ([facility])  LEFT (Left Eye)  Patient Location: PACU  Anesthesia Type: MAC  Level of Consciousness: awake, alert  and patient cooperative  Airway and Oxygen Therapy: Patient Spontanous Breathing and Patient connected to supplemental oxygen  Post-op Assessment: Post-op Vital signs reviewed, Patient's Cardiovascular Status Stable, Respiratory Function Stable, Patent Airway and No signs of Nausea or vomiting  Post-op Vital Signs: Reviewed and stable  Complications: No apparent anesthesia complications

## 2019-03-27 NOTE — H&P (Signed)

## 2019-03-27 NOTE — Anesthesia Postprocedure Evaluation (Signed)
Anesthesia Post Note  Patient: Benjamin Carney  Procedure(s) Performed: CATARACT EXTRACTION PHACO AND INTRAOCULAR LENS PLACEMENT ([place])  LEFT (Left Eye)  Patient location during evaluation: PACU Anesthesia Type: MAC Level of consciousness: awake and alert and oriented Pain management: satisfactory to patient Vital Signs Assessment: post-procedure vital signs reviewed and stable Respiratory status: spontaneous breathing, nonlabored ventilation and respiratory function stable Cardiovascular status: blood pressure returned to baseline and stable Postop Assessment: Adequate PO intake and No signs of nausea or vomiting Anesthetic complications: no    Raliegh Ip

## 2019-03-28 ENCOUNTER — Encounter: Payer: Self-pay | Admitting: Ophthalmology

## 2019-04-12 ENCOUNTER — Other Ambulatory Visit: Payer: Self-pay | Admitting: Internal Medicine

## 2019-04-12 ENCOUNTER — Other Ambulatory Visit: Payer: Self-pay

## 2019-04-12 ENCOUNTER — Ambulatory Visit (INDEPENDENT_AMBULATORY_CARE_PROVIDER_SITE_OTHER): Payer: Medicare Other | Admitting: Internal Medicine

## 2019-04-12 ENCOUNTER — Encounter: Payer: Self-pay | Admitting: Internal Medicine

## 2019-04-12 VITALS — BP 120/78 | HR 63 | Temp 97.9°F | Resp 16 | Wt 195.0 lb

## 2019-04-12 DIAGNOSIS — Z0000 Encounter for general adult medical examination without abnormal findings: Secondary | ICD-10-CM | POA: Diagnosis not present

## 2019-04-12 DIAGNOSIS — D696 Thrombocytopenia, unspecified: Secondary | ICD-10-CM

## 2019-04-12 DIAGNOSIS — E7800 Pure hypercholesterolemia, unspecified: Secondary | ICD-10-CM

## 2019-04-12 DIAGNOSIS — L719 Rosacea, unspecified: Secondary | ICD-10-CM

## 2019-04-12 DIAGNOSIS — H9190 Unspecified hearing loss, unspecified ear: Secondary | ICD-10-CM

## 2019-04-12 DIAGNOSIS — Z86018 Personal history of other benign neoplasm: Secondary | ICD-10-CM

## 2019-04-12 DIAGNOSIS — Z125 Encounter for screening for malignant neoplasm of prostate: Secondary | ICD-10-CM | POA: Diagnosis not present

## 2019-04-12 LAB — CBC WITH DIFFERENTIAL/PLATELET
Basophils Absolute: 0 10*3/uL (ref 0.0–0.1)
Basophils Relative: 0.6 % (ref 0.0–3.0)
Eosinophils Absolute: 0.1 10*3/uL (ref 0.0–0.7)
Eosinophils Relative: 3.1 % (ref 0.0–5.0)
HCT: 47.3 % (ref 39.0–52.0)
Hemoglobin: 15.7 g/dL (ref 13.0–17.0)
Lymphocytes Relative: 25.1 % (ref 12.0–46.0)
Lymphs Abs: 1.1 10*3/uL (ref 0.7–4.0)
MCHC: 33.1 g/dL (ref 30.0–36.0)
MCV: 93.5 fl (ref 78.0–100.0)
Monocytes Absolute: 0.4 10*3/uL (ref 0.1–1.0)
Monocytes Relative: 8 % (ref 3.0–12.0)
Neutro Abs: 2.9 10*3/uL (ref 1.4–7.7)
Neutrophils Relative %: 63.2 % (ref 43.0–77.0)
Platelets: 144 10*3/uL — ABNORMAL LOW (ref 150.0–400.0)
RBC: 5.06 Mil/uL (ref 4.22–5.81)
RDW: 13.9 % (ref 11.5–15.5)
WBC: 4.6 10*3/uL (ref 4.0–10.5)

## 2019-04-12 LAB — COMPREHENSIVE METABOLIC PANEL
ALT: 31 U/L (ref 0–53)
AST: 23 U/L (ref 0–37)
Albumin: 4.4 g/dL (ref 3.5–5.2)
Alkaline Phosphatase: 91 U/L (ref 39–117)
BUN: 18 mg/dL (ref 6–23)
CO2: 28 mEq/L (ref 19–32)
Calcium: 9 mg/dL (ref 8.4–10.5)
Chloride: 105 mEq/L (ref 96–112)
Creatinine, Ser: 0.94 mg/dL (ref 0.40–1.50)
GFR: 76.88 mL/min (ref >60.00)
Glucose, Bld: 88 mg/dL (ref 70–99)
Potassium: 4.4 mEq/L (ref 3.5–5.1)
Sodium: 141 mEq/L (ref 135–145)
Total Bilirubin: 1.3 mg/dL — ABNORMAL HIGH (ref 0.2–1.2)
Total Protein: 6.5 g/dL (ref 6.0–8.3)

## 2019-04-12 LAB — PSA, MEDICARE: PSA: 0.29 ng/ml (ref 0.10–4.00)

## 2019-04-12 LAB — LIPID PANEL
Cholesterol: 200 mg/dL (ref 0–200)
HDL: 32.8 mg/dL — ABNORMAL LOW (ref >39.00)
NonHDL: 167.23
Total CHOL/HDL Ratio: 6
Triglycerides: 261 mg/dL — ABNORMAL HIGH (ref 0.0–149.0)
VLDL: 52.2 mg/dL — ABNORMAL HIGH (ref 0.0–40.0)

## 2019-04-12 LAB — LDL CHOLESTEROL, DIRECT: Direct LDL: 142 mg/dL

## 2019-04-12 LAB — TSH: TSH: 2 u[IU]/mL (ref 0.35–4.50)

## 2019-04-12 NOTE — Assessment & Plan Note (Addendum)
Low cholesterol diet and exercise.  Has been intolerant to crestor and lipitor.  Discussed zetia. he declines. Follow lipid panel.

## 2019-04-12 NOTE — Assessment & Plan Note (Signed)
Physical today 04/12/19.  Colonoscopy 07/23/11 with polyps.  Recommended f/u in 3-5 years.  Discussed with him again today.  He declines further colon cancer screening/testing.  Check psa.

## 2019-04-12 NOTE — Assessment & Plan Note (Signed)
Follow cbc.  

## 2019-04-12 NOTE — Progress Notes (Signed)
Order placed for f/u labs.  

## 2019-04-12 NOTE — Progress Notes (Signed)
[name], male   DOB: 30-Nov-1934, 84 y.o.   MRN: [id number]   Subjective:    [name], male    DOB: 04-23-1935, 84 y.o.   MRN: [id number]  HPI  Patient here for his physical exam.  Doing well.  Feels good.  Staying active.  Playing golf.  No chest pain.  No sob.  No acid reflux.  No abdominal pain.  Bowels moving.  No urine change.  Had intolerance to crestor and lipitor.  Not taking zetia.  Desires not to take cholesterol medication.  Discussed colon cancer screening.  He declines f/u colonoscopy and declines any further colon testing.  S/p cataract surgery 03/27/19.  Ears doing well.     Past Medical History:  Diagnosis Date  . Acoustic neuroma ([place])   . Arthritis    hands  . Cochlear implant status    bilateral  . Complication of anesthesia    difficult intubation for one of his "head surgeries at [facility]" per pt.  . History of colonic polyps   . History of craniotomy   . Hypercholesterolemia   . Macular degeneration   . Psoriasis   . Rheumatic fever age 19  . Rosacea   . Scarlet fever age 74  . Vertigo   . Wears dentures    full upper, partial lower   Past Surgical History:  Procedure Laterality Date  . APPENDECTOMY  1959  . BUNIONECTOMY    . CATARACT EXTRACTION W/PHACO Left 03/27/2019   Procedure: CATARACT EXTRACTION PHACO AND INTRAOCULAR LENS PLACEMENT ([place])  LEFT;  Surgeon: Eulogio Bear, MD;  Location: [facility];  Service: Ophthalmology;  Laterality: Left;  IVA BLOCK  . COCHLEAR IMPLANT     acute hearing loss  . CRANIECTOMY FOR EXCISION OF ACOUSTIC NEUROMA  1994  . craniotomy for meningoma     Family History  Problem Relation Age of Onset  . Hypertension Mother   . Heart disease Mother   . Heart failure Mother   . Cancer Father 78       prostate  . Stroke Father   . Heart disease Brother 15       CABG  . Stroke Son   . Colon cancer Neg Hx    Social History   Socioeconomic History  . Marital status: Divorced     Spouse name: Not on file  . Number of children: 3  . Years of education: Not on file  . Highest education level: Not on file  Occupational History  . Not on file  Social Needs  . Financial resource strain: Not hard at all  . Food insecurity    Worry: Never true    Inability: Never true  . Transportation needs    Medical: No    Non-medical: No  Tobacco Use  . Smoking status: Former Smoker    Packs/day: 1.50    Years: 40.00    Pack years: 60.00    Quit date: 10/05/2000    Years since quitting: 18.5  . Smokeless tobacco: Former Systems developer    Quit date: 1990  Substance and Sexual Activity  . Alcohol use: Yes    Alcohol/week: 0.0 standard drinks    Comment: occasional wine  . Drug use: No  . Sexual activity: Not on file  Lifestyle  . Physical activity    Days per week: 0 days    Minutes per session: Not on file  . Stress: Not at all  Relationships  .  Social Herbalist on phone: Not on file    Gets together: Not on file    Attends religious service: Not on file    Active member of club or organization: Not on file    Attends meetings of clubs or organizations: Not on file    Relationship status: Not on file  Other Topics Concern  . Not on file  Social History Narrative  . Not on file    Outpatient Encounter Medications as of 04/12/2019  Medication Sig  . aspirin EC 81 MG tablet Take 81 mg by mouth daily.  Marland Kitchen docusate sodium (COLACE) 100 MG capsule Take 100 mg by mouth 2 (two) times daily as needed for mild constipation.  . fluticasone (FLONASE) 50 MCG/ACT nasal spray Place 2 sprays into both nostrils daily.  Marland Kitchen Ketotifen Fumarate (ZADITOR OP) Apply to eye daily as needed.  . Multiple Vitamins-Minerals (VISION-VITE PRESERVE PO) Take by mouth.  . timolol (TIMOPTIC) 0.5 % ophthalmic solution Place 1 drop into both eyes daily.  Marland Kitchen triamcinolone cream (KENALOG) 0.1 % Apply 1 application topically as needed (for Rosacea).   No facility-administered encounter medications  on file as of 04/12/2019.     Review of Systems  Constitutional: Negative for appetite change and unexpected weight change.  HENT: Negative for congestion and sinus pressure.   Eyes: Negative for pain and visual disturbance.  Respiratory: Negative for cough, chest tightness and shortness of breath.   Cardiovascular: Negative for chest pain, palpitations and leg swelling.  Gastrointestinal: Negative for abdominal pain, diarrhea, nausea and vomiting.  Genitourinary: Negative for difficulty urinating and dysuria.  Musculoskeletal: Negative for joint swelling and myalgias.  Skin: Negative for color change and rash.  Neurological: Negative for dizziness, light-headedness and headaches.  Hematological: Negative for adenopathy. Does not bruise/bleed easily.  Psychiatric/Behavioral: Negative for agitation and dysphoric mood.       Objective:    Physical Exam Constitutional:      General: He is not in acute distress.    Appearance: Normal appearance. He is well-developed.  HENT:     Head: Normocephalic and atraumatic.     Right Ear: External ear normal.     Left Ear: External ear normal.  Eyes:     General:        Right eye: No discharge.        Left eye: No discharge.     Conjunctiva/sclera: Conjunctivae normal.  Neck:     Musculoskeletal: Neck supple. No muscular tenderness.     Thyroid: No thyromegaly.  Cardiovascular:     Rate and Rhythm: Normal rate and regular rhythm.  Pulmonary:     Effort: No respiratory distress.     Breath sounds: Normal breath sounds. No wheezing.  Abdominal:     General: Bowel sounds are normal.     Palpations: Abdomen is soft.     Tenderness: There is no abdominal tenderness.  Genitourinary:    Comments: Not performed Musculoskeletal:        General: No tenderness.  Lymphadenopathy:     Cervical: No cervical adenopathy.  Skin:    Findings: No erythema or rash.  Neurological:     Mental Status: He is alert and oriented to person, place, and  time.  Psychiatric:        Mood and Affect: Mood normal.        Behavior: Behavior normal.     BP 120/78   Pulse 63   Temp 97.9 F (36.6 C) (Oral)  Resp 16   Wt 195 lb (88.5 kg)   SpO2 97%   BMI 29.65 kg/m  Wt Readings from Last 3 Encounters:  04/12/19 195 lb (88.5 kg)  03/27/19 195 lb (88.5 kg)  10/12/18 193 lb (87.5 kg)     Lab Results  Component Value Date   WBC 4.6 04/12/2019   HGB 15.7 04/12/2019   HCT 47.3 04/12/2019   PLT 144.0 (L) 04/12/2019   GLUCOSE 88 04/12/2019   CHOL 200 04/12/2019   TRIG 261.0 (H) 04/12/2019   HDL 32.80 (L) 04/12/2019   LDLDIRECT 142.0 04/12/2019   LDLCALC 141 (H) 01/20/2018   ALT 31 04/12/2019   AST 23 04/12/2019   NA 141 04/12/2019   K 4.4 04/12/2019   CL 105 04/12/2019   CREATININE 0.94 04/12/2019   BUN 18 04/12/2019   CO2 28 04/12/2019   TSH 2.00 04/12/2019   PSA 0.29 04/12/2019       Assessment & Plan:   Problem List Items Addressed This Visit    Health care maintenance    Physical today 04/12/19.  Colonoscopy 07/23/11 with polyps.  Recommended f/u in 3-5 years.  Discussed with him again today.  He declines further colon cancer screening/testing.  Check psa.       Hearing loss    S/p cochlea implant.  Followed by ENT.       History of meningioma    S/p removal. Was followed by neurosurgery.  No f/u warranted.  Doing well.        Hypercholesterolemia    Low cholesterol diet and exercise.  Has been intolerant to crestor and lipitor.  Discussed zetia. he declines. Follow lipid panel.       Relevant Orders   Comprehensive metabolic panel (Completed)   Lipid panel (Completed)   TSH (Completed)   Thrombocytopenia ([facility])    Follow cbc.       Relevant Orders   CBC with Differential/Platelet (Completed)    Other Visit Diagnoses    Prostate cancer screening    -  Primary   Relevant Orders   PSA, Medicare (Completed)   Rosacea       Relevant Orders   Ambulatory referral to Dermatology       Einar Pheasant,  MD

## 2019-04-16 ENCOUNTER — Encounter: Payer: Self-pay | Admitting: Internal Medicine

## 2019-04-16 IMAGING — DX DG CHEST 2V
2 series · 2 of 2 positions shown · non-contrast
Comparison: No prior.

CLINICAL DATA: Shortness of breath.

EXAM:
CHEST - 2 VIEW

[chest pa]
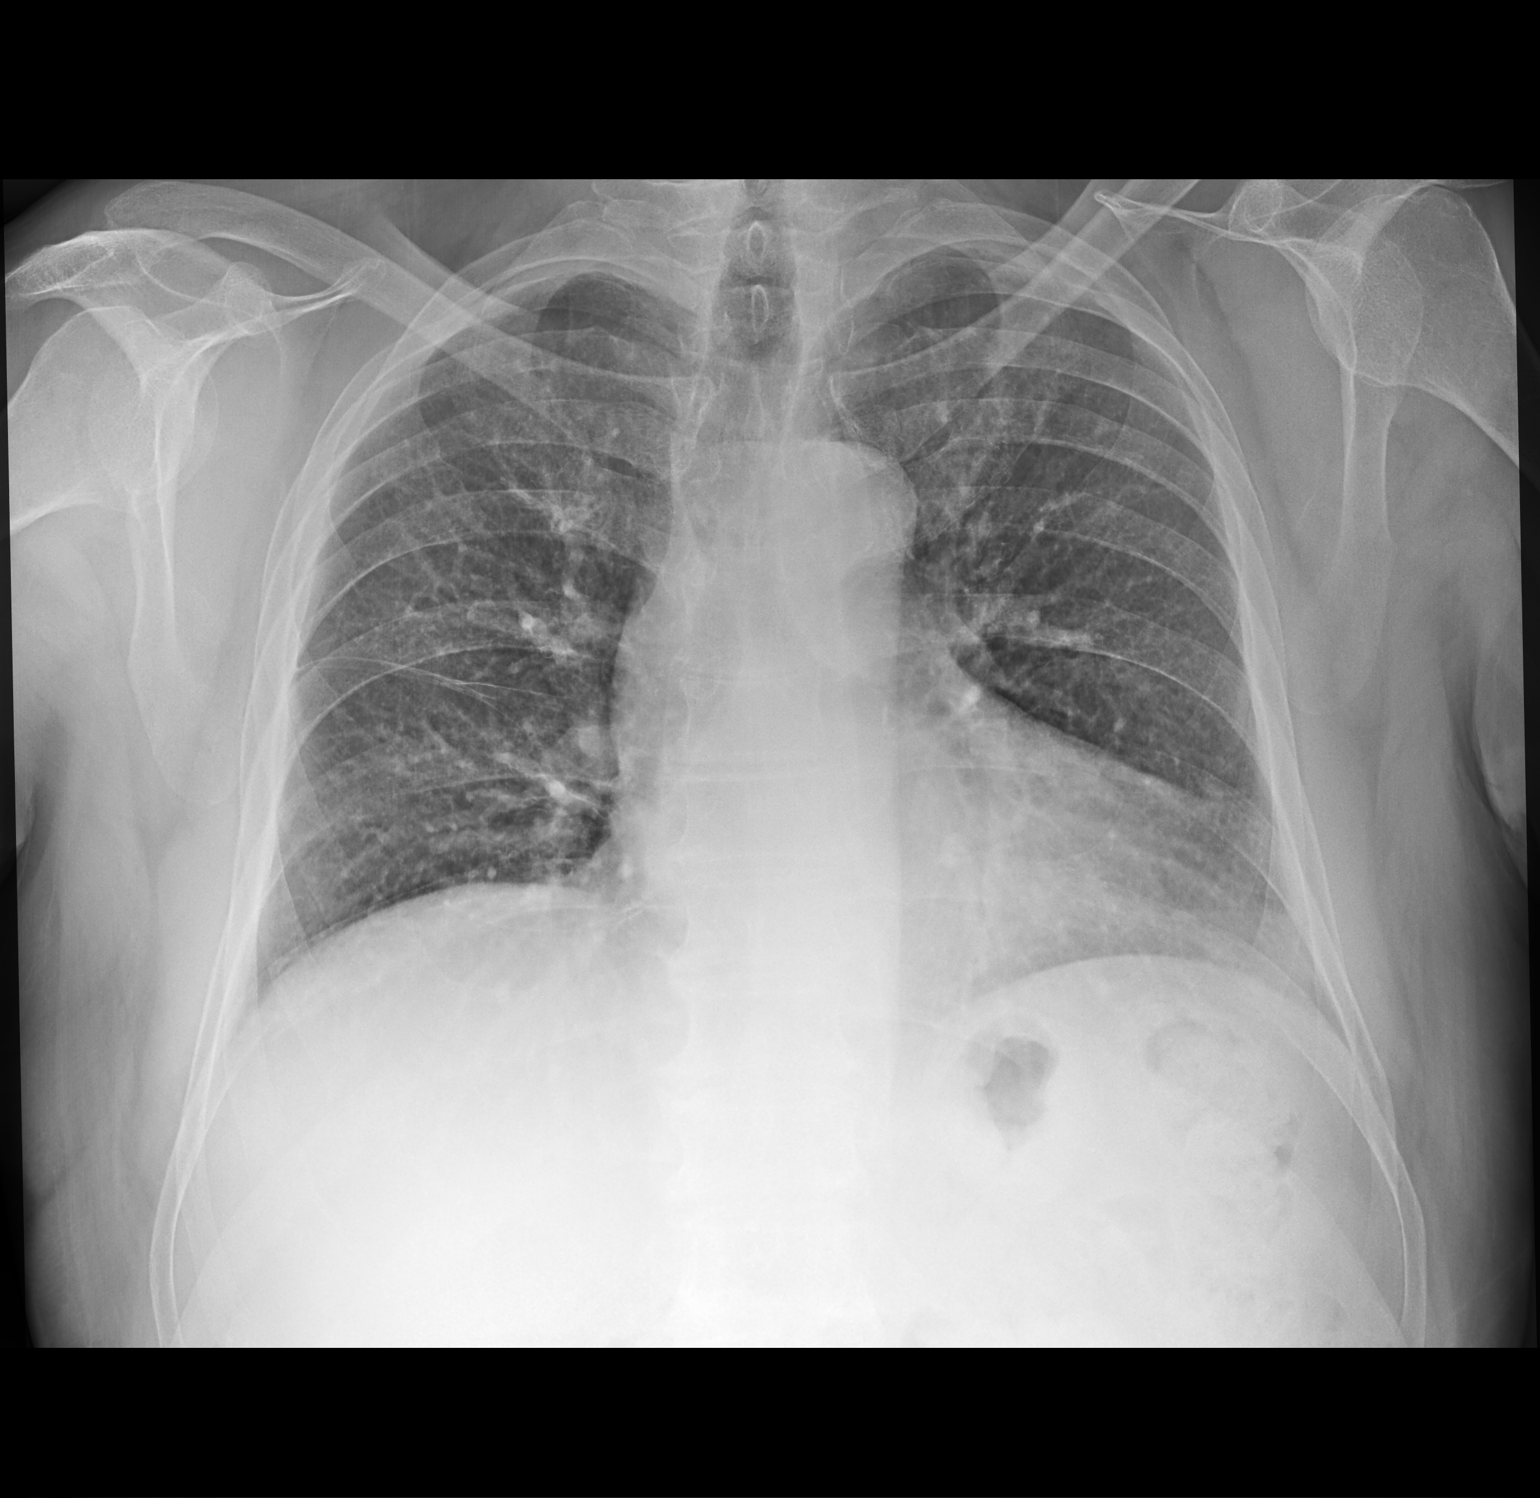

[chest lat]
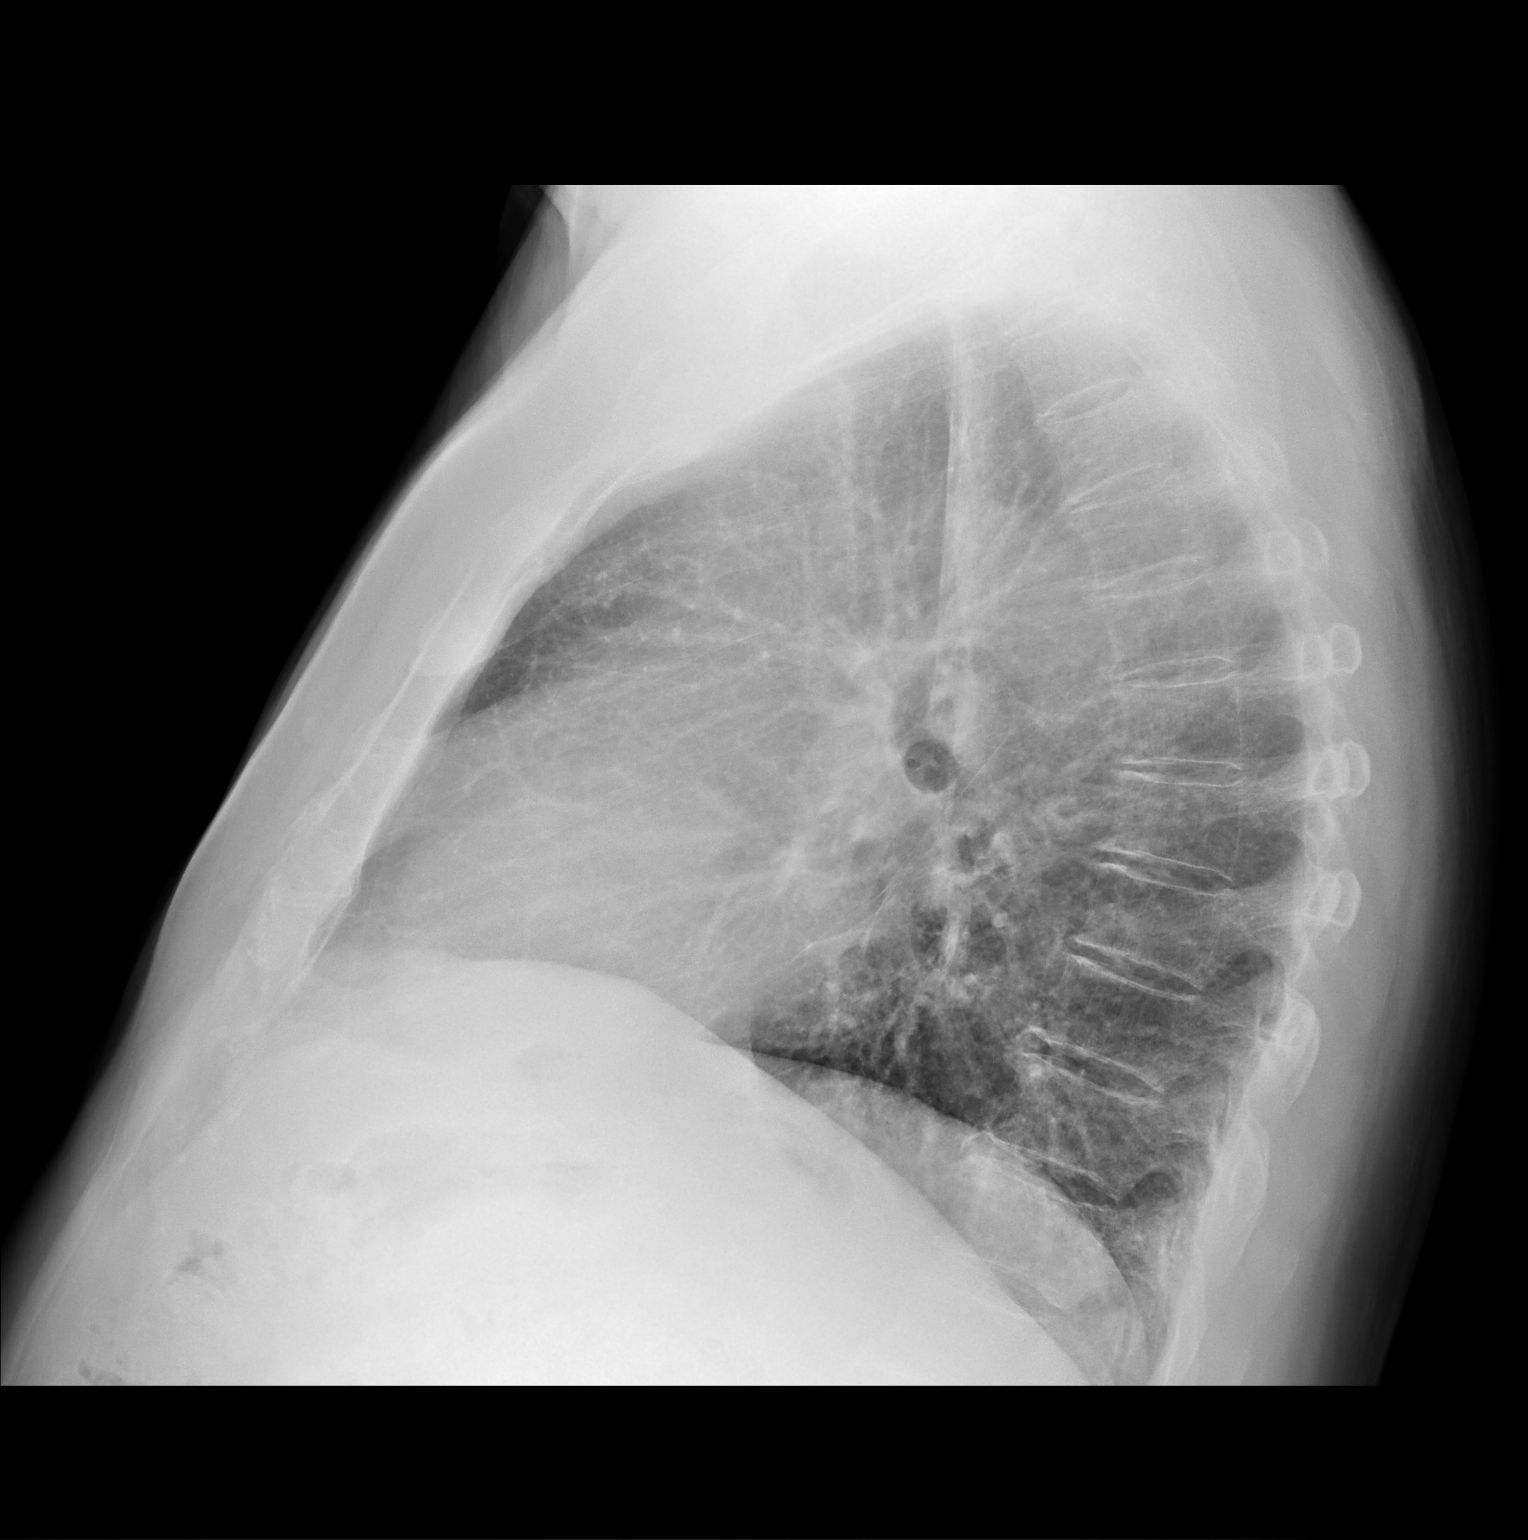

[2 of 2 positions shown; findings below may reference images not displayed]

FINDINGS: Mediastinum and hilar structures normal. Cardiomegaly with normal
pulmonary vascularity. Low lung volumes with mild basilar
atelectasis. No pleural effusion or pneumothorax.
IMPRESSION: Number low lung volumes with mild basilar atelectasis.

2.  Mild cardiomegaly.  No pulmonary venous congestion.

## 2019-04-16 NOTE — Assessment & Plan Note (Signed)
S/p removal.  Was followed by neurosurgery.  No f/u warranted.  Doing well.  

## 2019-04-16 NOTE — Assessment & Plan Note (Signed)
S/p cochlea implant.  Followed by ENT.  

## 2019-05-02 ENCOUNTER — Other Ambulatory Visit: Payer: Self-pay

## 2019-05-02 ENCOUNTER — Other Ambulatory Visit (INDEPENDENT_AMBULATORY_CARE_PROVIDER_SITE_OTHER): Payer: Medicare Other

## 2019-05-02 DIAGNOSIS — D696 Thrombocytopenia, unspecified: Secondary | ICD-10-CM

## 2019-05-02 LAB — HEPATIC FUNCTION PANEL
ALT: 29 U/L (ref 0–53)
AST: 21 U/L (ref 0–37)
Albumin: 4.3 g/dL (ref 3.5–5.2)
Alkaline Phosphatase: 102 U/L (ref 39–117)
Bilirubin, Direct: 0.2 mg/dL (ref 0.0–0.3)
Total Bilirubin: 1 mg/dL (ref 0.2–1.2)
Total Protein: 6.3 g/dL (ref 6.0–8.3)

## 2019-05-02 LAB — PLATELET COUNT: Platelets: 162 10*3/uL (ref 140–400)

## 2019-05-03 ENCOUNTER — Encounter: Payer: Self-pay | Admitting: Internal Medicine

## 2019-06-13 ENCOUNTER — Ambulatory Visit: Payer: Medicare Other

## 2019-06-16 ENCOUNTER — Ambulatory Visit (INDEPENDENT_AMBULATORY_CARE_PROVIDER_SITE_OTHER): Payer: Medicare Other

## 2019-06-16 ENCOUNTER — Other Ambulatory Visit: Payer: Self-pay

## 2019-06-16 DIAGNOSIS — Z23 Encounter for immunization: Secondary | ICD-10-CM

## 2019-07-17 ENCOUNTER — Ambulatory Visit: Payer: Medicare Other | Admitting: Internal Medicine

## 2019-07-17 ENCOUNTER — Other Ambulatory Visit: Payer: Self-pay

## 2019-07-17 ENCOUNTER — Ambulatory Visit (INDEPENDENT_AMBULATORY_CARE_PROVIDER_SITE_OTHER): Payer: Medicare Other

## 2019-07-17 DIAGNOSIS — Z0000 Encounter for general adult medical examination without abnormal findings: Secondary | ICD-10-CM | POA: Diagnosis not present

## 2019-07-17 NOTE — Patient Instructions (Signed)
  Mr. Noun , Thank you for taking time to come for your Medicare Wellness Visit. I appreciate your ongoing commitment to your health goals. Please review the following plan we discussed and let me know if I can assist you in the future.   These are the goals we discussed: Goals      Patient Stated   . Increase physical activity (pt-stated)     Walk 20 minutes, 3 days a week       This is a list of the screening recommended for you and due dates:  Health Maintenance  Topic Date Due  . Tetanus Vaccine  07/20/1956  . Pneumonia vaccines  Completed  . Flu Shot  Discontinued

## 2019-07-17 NOTE — Progress Notes (Addendum)
Subjective:   Benjamin Carney is a 84 y.o. male who presents for Medicare Annual/Subsequent preventive examination.  Review of Systems:  No ROS.  Medicare Wellness Virtual Visit.  Visual/audio telehealth visit, UTA vital signs.   See social history for additional risk factors.    Cardiac Risk Factors include: advanced age (>56men, >35 women);male gender     Objective:    Vitals: There were no vitals taken for this visit.  There is no height or weight on file to calculate BMI.  Advanced Directives 07/17/2019 03/27/2019 07/11/2018 07/08/2017  Does Patient Have a Medical Advance Directive? Yes Yes Yes Yes  Type of Paramedic of [place];Living will [place];Living will [place];Living will [place];Living will  Does patient want to make changes to medical advance directive? No - Patient declined No - Patient declined No - Patient declined No - Patient declined  Copy of [place] in Chart? No - copy requested Yes - validated most recent copy scanned in chart (See row information) No - copy requested No - copy requested    Tobacco Social History   Tobacco Use  Smoking Status Former Smoker  . Packs/day: 1.50  . Years: 40.00  . Pack years: 60.00  . Quit date: 10/05/2000  . Years since quitting: 18.7  Smokeless Tobacco Former Systems developer  . Quit date: 50     Counseling given: Not Answered   Clinical Intake:  Pre-visit preparation completed: Yes        Diabetes: No  How often do you need to have someone help you when you read instructions, pamphlets, or other written materials from your doctor or pharmacy?: 1 - Never  Interpreter Needed?: No     Past Medical History:  Diagnosis Date  . Acoustic neuroma ([place])   . Arthritis    hands  . Cochlear implant status    bilateral  . Complication of anesthesia    difficult intubation for one of his "head surgeries at [facility]" per  pt.  . History of colonic polyps   . History of craniotomy   . Hypercholesterolemia   . Macular degeneration   . Psoriasis   . Rheumatic fever age 28  . Rosacea   . Scarlet fever age 45  . Vertigo   . Wears dentures    full upper, partial lower   Past Surgical History:  Procedure Laterality Date  . APPENDECTOMY  1959  . BUNIONECTOMY    . CATARACT EXTRACTION W/PHACO Left 03/27/2019   Procedure: CATARACT EXTRACTION PHACO AND INTRAOCULAR LENS PLACEMENT ([place])  LEFT;  Surgeon: Eulogio Bear, MD;  Location: [place];  Service: Ophthalmology;  Laterality: Left;  IVA BLOCK  . COCHLEAR IMPLANT     acute hearing loss  . CRANIECTOMY FOR EXCISION OF ACOUSTIC NEUROMA  1994  . craniotomy for meningoma     Family History  Problem Relation Age of Onset  . Hypertension Mother   . Heart disease Mother   . Heart failure Mother   . Cancer Father 75       prostate  . Stroke Father   . Heart disease Brother 25       CABG  . Stroke Son   . Colon cancer Neg Hx    Social History   Socioeconomic History  . Marital status: Divorced    Spouse name: Not on file  . Number of children: 3  . Years of education: Not on  file  . Highest education level: Not on file  Occupational History  . Not on file  Social Needs  . Financial resource strain: Not hard at all  . Food insecurity    Worry: Never true    Inability: Never true  . Transportation needs    Medical: No    Non-medical: No  Tobacco Use  . Smoking status: Former Smoker    Packs/day: 1.50    Years: 40.00    Pack years: 60.00    Quit date: 10/05/2000    Years since quitting: 18.7  . Smokeless tobacco: Former Systems developer    Quit date: 1990  Substance and Sexual Activity  . Alcohol use: Yes    Alcohol/week: 0.0 standard drinks    Comment: occasional wine  . Drug use: No  . Sexual activity: Not on file  Lifestyle  . Physical activity    Days per week: 0 days    Minutes per session: Not on file  . Stress: Not at all   Relationships  . Social connections    Talks on phone: More than three times a week    Gets together: Twice a week    Attends religious service: More than 4 times per year    Active member of club or organization: Yes    Attends meetings of clubs or organizations: More than 4 times per year    Relationship status: Divorced  Other Topics Concern  . Not on file  Social History Narrative  . Not on file    Outpatient Encounter Medications as of 07/17/2019  Medication Sig  . aspirin EC 81 MG tablet Take 81 mg by mouth daily.  Marland Kitchen docusate sodium (COLACE) 100 MG capsule Take 100 mg by mouth 2 (two) times daily as needed for mild constipation.  . fluticasone (FLONASE) 50 MCG/ACT nasal spray Place 2 sprays into both nostrils daily.  Marland Kitchen Ketotifen Fumarate (ZADITOR OP) Apply to eye daily as needed.  . Multiple Vitamins-Minerals (VISION-VITE PRESERVE PO) Take by mouth.  . timolol (TIMOPTIC) 0.5 % ophthalmic solution Place 1 drop into both eyes daily.  Marland Kitchen triamcinolone cream (KENALOG) 0.1 % Apply 1 application topically as needed (for Rosacea).   No facility-administered encounter medications on file as of 07/17/2019.     Activities of Daily Living In your present state of health, do you have any difficulty performing the following activities: 07/17/2019 03/27/2019  Hearing? Y N  Vision? N N  Difficulty concentrating or making decisions? N N  Walking or climbing stairs? N N  Dressing or bathing? N N  Doing errands, shopping? N -  Preparing Food and eating ? N -  Using the Toilet? N -  In the past six months, have you accidently leaked urine? N -  Do you have problems with loss of bowel control? N -  Managing your Medications? N -  Managing your Finances? N -  Housekeeping or managing your Housekeeping? N -  Some recent data might be hidden    Patient Care Team: Einar Pheasant, MD as PCP - General (Internal Medicine)   Assessment:   This is a routine wellness examination for Benjamin Carney.   I connected with patient 07/17/19 at  8:30 AM EDT by an audio enabled telemedicine application and verified that I am speaking with the correct person using two identifiers. Patient stated full name and DOB. Patient gave permission to continue with virtual visit. Patient's location was at home and Nurse's location was at [place] office.   Health  Maintenance Due: -Tdap- discussed; to be completed with doctor in visit or local pharmacy.   Update all pending maintenance due as appropriate.   See completed HM at the end of note.   Eye: Visual acuity not assessed. Virtual visit. Wears corrective lenses. Followed by their ophthalmologist.  Cataract extraction.  Macular degeneration; injections in progress.   Dental: Visits every 12 months. Annual visits. Dentures.  Hearing: Hearing aids- yes  Safety:  Patient feels safe at home- yes Patient does have smoke detectors at home- yes Patient does wear sunscreen or protective clothing when in direct sunlight - yes Patient does wear seat belt when in a moving vehicle - yes Patient drives- yes Adequate lighting in walkways free from debris- yes Grab bars and handrails used as appropriate- yes Ambulates with an assistive device Cell phone on person when ambulating outside of the home- yes  Social: Alcohol intake - yes      Smoking history- former    Smokers in home? none Illicit drug use? none  Depression: PHQ 2 &9 complete. See screening below. Denies irritability, anhedonia, sadness/tearfullness.  Stable.   Falls: See screening below.    Medication: Taking as directed and without issues.   Covid-19: Precautions and sickness symptoms discussed. Wears mask, social distancing, hand hygiene as appropriate.   Activities of Daily Living Patient denies needing assistance with: household chores, feeding themselves, getting from bed to chair, getting to the toilet, bathing/showering, dressing, managing money, or preparing meals.   Memory:  Patient is alert. Patient denies difficulty focusing or concentrating. Correctly identified the president of the [place], season and recall. 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  BMI- discussed the importance of a healthy diet, water intake and the benefits of aerobic exercise.  Educational material provided.  Physical activity- walking, yard work, golf  Diet: regular Water: good intake Caffeine: 2 cups of coffee  Other Providers Patient Care Team: Einar Pheasant, MD as PCP - General (Internal Medicine)  Exercise Activities and Dietary recommendations    Goals      Patient Stated   . Increase physical activity (pt-stated)     Walk 20 minutes, 3 days a week       Fall Risk Fall Risk  07/17/2019 07/11/2018 07/08/2017 07/14/2016 12/17/2014  Falls in the past year? 0 No No No No   Timed Get Up and Go Performed: no, virtual visit  Depression Screen PHQ 2/9 Scores 07/17/2019 07/11/2018 07/08/2017 07/14/2016  PHQ - 2 Score 0 0 0 0    Cognitive Function MMSE - Mini Mental State Exam 07/11/2018  Orientation to time 5  Orientation to Place 5  Registration 3  Attention/ Calculation 5  Recall 3  Language- name 2 objects 2  Language- repeat 1  Language- follow 3 step command 3  Language- read & follow direction 1  Write a sentence 1  Copy design 1  Total score 30     6CIT Screen 07/17/2019 07/08/2017  What Year? 0 points 0 points  What month? 0 points 0 points  What time? 0 points 0 points  Count back from 20 0 points 0 points  Months in reverse 0 points 0 points  Repeat phrase 0 points 0 points  Total Score 0 0    Immunization History  Administered Date(s) Administered  . Fluad Quad(high Dose 65+) 06/16/2019  . Influenza Split 09/11/2011, 08/15/2012  . Influenza, High Dose Seasonal PF 07/21/2017,  07/14/2018  . Influenza,inj,Quad PF,6+ Mos 08/25/2013, 10/10/2014, 09/03/2015, 07/29/2016   . Pneumococcal Conjugate-13 12/07/2013  . Pneumococcal Polysaccharide-23 06/05/2009  . Zoster 09/22/2010   Screening Tests Health Maintenance  Topic Date Due  . TETANUS/TDAP  07/20/1956  . PNA vac Low Risk Adult  Completed  . INFLUENZA VACCINE  Discontinued       Plan:   Keep all routine maintenance appointments.   Follow up 10/13/19 @ 8:00  Medicare Attestation I have personally reviewed: The patient's medical and social history Their use of alcohol, tobacco or illicit drugs Their current medications and supplements The patient's functional ability including ADLs,fall risks, home safety risks, cognitive, and hearing and visual impairment Diet and physical activities Evidence for depression   In addition, I have reviewed and discussed with patient certain preventive protocols, quality metrics, and best practice recommendations. A written personalized care plan for preventive services as well as general preventive health recommendations were provided to patient via mail.     Varney Biles, LPN  [id number]   Reviewed above information.  Agree with assessment and plan.    Dr Nicki Reaper

## 2019-09-26 ENCOUNTER — Encounter: Payer: Self-pay | Admitting: Internal Medicine

## 2019-09-30 NOTE — Telephone Encounter (Signed)
Called and discussed with pt.  Questions answered.

## 2019-10-09 ENCOUNTER — Encounter: Payer: Self-pay | Admitting: Internal Medicine

## 2019-10-13 ENCOUNTER — Ambulatory Visit (INDEPENDENT_AMBULATORY_CARE_PROVIDER_SITE_OTHER): Payer: Medicare Other | Admitting: Internal Medicine

## 2019-10-13 ENCOUNTER — Encounter: Payer: Self-pay | Admitting: Internal Medicine

## 2019-10-13 DIAGNOSIS — D696 Thrombocytopenia, unspecified: Secondary | ICD-10-CM

## 2019-10-13 DIAGNOSIS — Z860100 Personal history of colon polyps, unspecified: Secondary | ICD-10-CM

## 2019-10-13 DIAGNOSIS — E7800 Pure hypercholesterolemia, unspecified: Secondary | ICD-10-CM | POA: Diagnosis not present

## 2019-10-13 DIAGNOSIS — Z86018 Personal history of other benign neoplasm: Secondary | ICD-10-CM

## 2019-10-13 NOTE — Assessment & Plan Note (Signed)
Colonoscopy 2012 with polyp removed.  GI had recommended f/u colonoscopy in 3-5 years.  Discussed with him today.  Declines.  Currently without problems. Follow.

## 2019-10-13 NOTE — Assessment & Plan Note (Signed)
Had intolerance to crestor and lipitor.  Have discussed zetia.  Low cholesterol diet and exercise.  Follow lipid panel.

## 2019-10-13 NOTE — Progress Notes (Signed)
[name], male   DOB: 19-Feb-1935, 85 y.o.   MRN: [id number]   Virtual Visit via video Note  This visit type was conducted due to national recommendations for restrictions regarding the COVID-19 pandemic (e.g. social distancing).  This format is felt to be most appropriate for this patient at this time.  All issues noted in this document were discussed and addressed.  No physical exam was performed (except for noted visual exam findings with Video Visits).   I connected with Selina Cooley by telephone and verified that I am speaking with the correct person using two identifiers. Location patient: home Location provider: work  Persons participating in the virtual visit: patient, provider  The limitations, risks, security and privacy concerns of performing an evaluation and management service by telephone and the availability of in person appointments have been discussed.   The patient expressed understanding and agreed to proceed.   Reason for visit: scheduled follow up.    HPI: He reports he is doing well.  Feels good.  Stays active.  Has been hunting.  No chest pain.  No sob.  No cough or congestion reported.  Received covid vaccine yesterday.  No abdominal pain reported.  Bowels stable.  No blood.  Has had intolerance to statin medication.  Will schedule f/u cholesterol labs.  Overall feels good.    ROS: See pertinent positives and negatives per HPI.  Past Medical History:  Diagnosis Date  . Acoustic neuroma ([place])   . Arthritis    hands  . Cochlear implant status    bilateral  . Complication of anesthesia    difficult intubation for one of his "head surgeries at [facility]" per pt.  . History of colonic polyps   . History of craniotomy   . Hypercholesterolemia   . Macular degeneration   . Psoriasis   . Rheumatic fever age 57  . Rosacea   . Scarlet fever age 84  . Vertigo   . Wears dentures    full upper, partial lower    Past Surgical History:  Procedure  Laterality Date  . APPENDECTOMY  1959  . BUNIONECTOMY    . CATARACT EXTRACTION W/PHACO Left 03/27/2019   Procedure: CATARACT EXTRACTION PHACO AND INTRAOCULAR LENS PLACEMENT ([place])  LEFT;  Surgeon: Eulogio Bear, MD;  Location: [place];  Service: Ophthalmology;  Laterality: Left;  IVA BLOCK  . COCHLEAR IMPLANT     acute hearing loss  . CRANIECTOMY FOR EXCISION OF ACOUSTIC NEUROMA  1994  . craniotomy for meningoma      Family History  Problem Relation Age of Onset  . Hypertension Mother   . Heart disease Mother   . Heart failure Mother   . Cancer Father 44       prostate  . Stroke Father   . Heart disease Brother 27       CABG  . Stroke Son   . Colon cancer Neg Hx     SOCIAL HX: reviewed.    Current Outpatient Medications:  .  aspirin EC 81 MG tablet, Take 81 mg by mouth daily., Disp: , Rfl:  .  docusate sodium (COLACE) 100 MG capsule, Take 100 mg by mouth 2 (two) times daily as needed for mild constipation., Disp: , Rfl:  .  fluticasone (FLONASE) 50 MCG/ACT nasal spray, Place 2 sprays into both nostrils daily., Disp: 48 g, Rfl: 1 .  Ketotifen Fumarate (ZADITOR OP), Apply to eye daily as needed., Disp: , Rfl:  .  Multiple Vitamins-Minerals (VISION-VITE PRESERVE PO), Take by mouth., Disp: , Rfl:  .  timolol (TIMOPTIC) 0.5 % ophthalmic solution, Place 1 drop into both eyes daily., Disp: , Rfl:  .  triamcinolone cream (KENALOG) 0.1 %, Apply 1 application topically as needed (for Rosacea)., Disp: , Rfl:   EXAM:  VITALS per patient if applicable: pulse ox 0000000, pulse 74  GENERAL: alert.  Sounds to be in no acute distress.  Answering questions appropriately.   PSYCH/NEURO: pleasant and cooperative, no obvious depression or anxiety, speech and thought processing grossly intact  ASSESSMENT AND PLAN:  Discussed the following assessment and plan:  History of colonic polyps Colonoscopy 2012 with polyp removed.  GI had recommended f/u colonoscopy in 3-5 years.   Discussed with him today.  Declines.  Currently without problems. Follow.    History of meningioma S/p removal.  Was followed by neurosurgery.  No f/u warranted.  Doing well.    Hypercholesterolemia Had intolerance to crestor and lipitor.  Have discussed zetia.  Low cholesterol diet and exercise.  Follow lipid panel.    Thrombocytopenia Platelet count 05/02/19 - wnl.     Orders Placed This Encounter  Procedures  . Comprehensive metabolic panel    Standing Status:   Future    Standing Expiration Date:   10/12/2020  . Lipid panel    Standing Status:   Future    Standing Expiration Date:   10/12/2020     I discussed the assessment and treatment plan with the patient. The patient was provided an opportunity to ask questions and all were answered. The patient agreed with the plan and demonstrated an understanding of the instructions.   The patient was advised to call back or seek an in-person evaluation if the symptoms worsen or if the condition fails to improve as anticipated.  I provided 15 minutes of non-face-to-face time during this encounter.   Einar Pheasant, MD

## 2019-10-13 NOTE — Assessment & Plan Note (Signed)
Platelet count 05/02/19 - wnl.

## 2019-10-13 NOTE — Assessment & Plan Note (Signed)
S/p removal.  Was followed by neurosurgery.  No f/u warranted.  Doing well.  

## 2019-11-05 IMAGING — DX DG CHEST 2V
2 series · 2 of 2 positions shown · non-contrast
Comparison: 01/20/2018

CLINICAL DATA: Chronic shortness of breath over the last year.

EXAM:
CHEST - 2 VIEW

[chest pa]
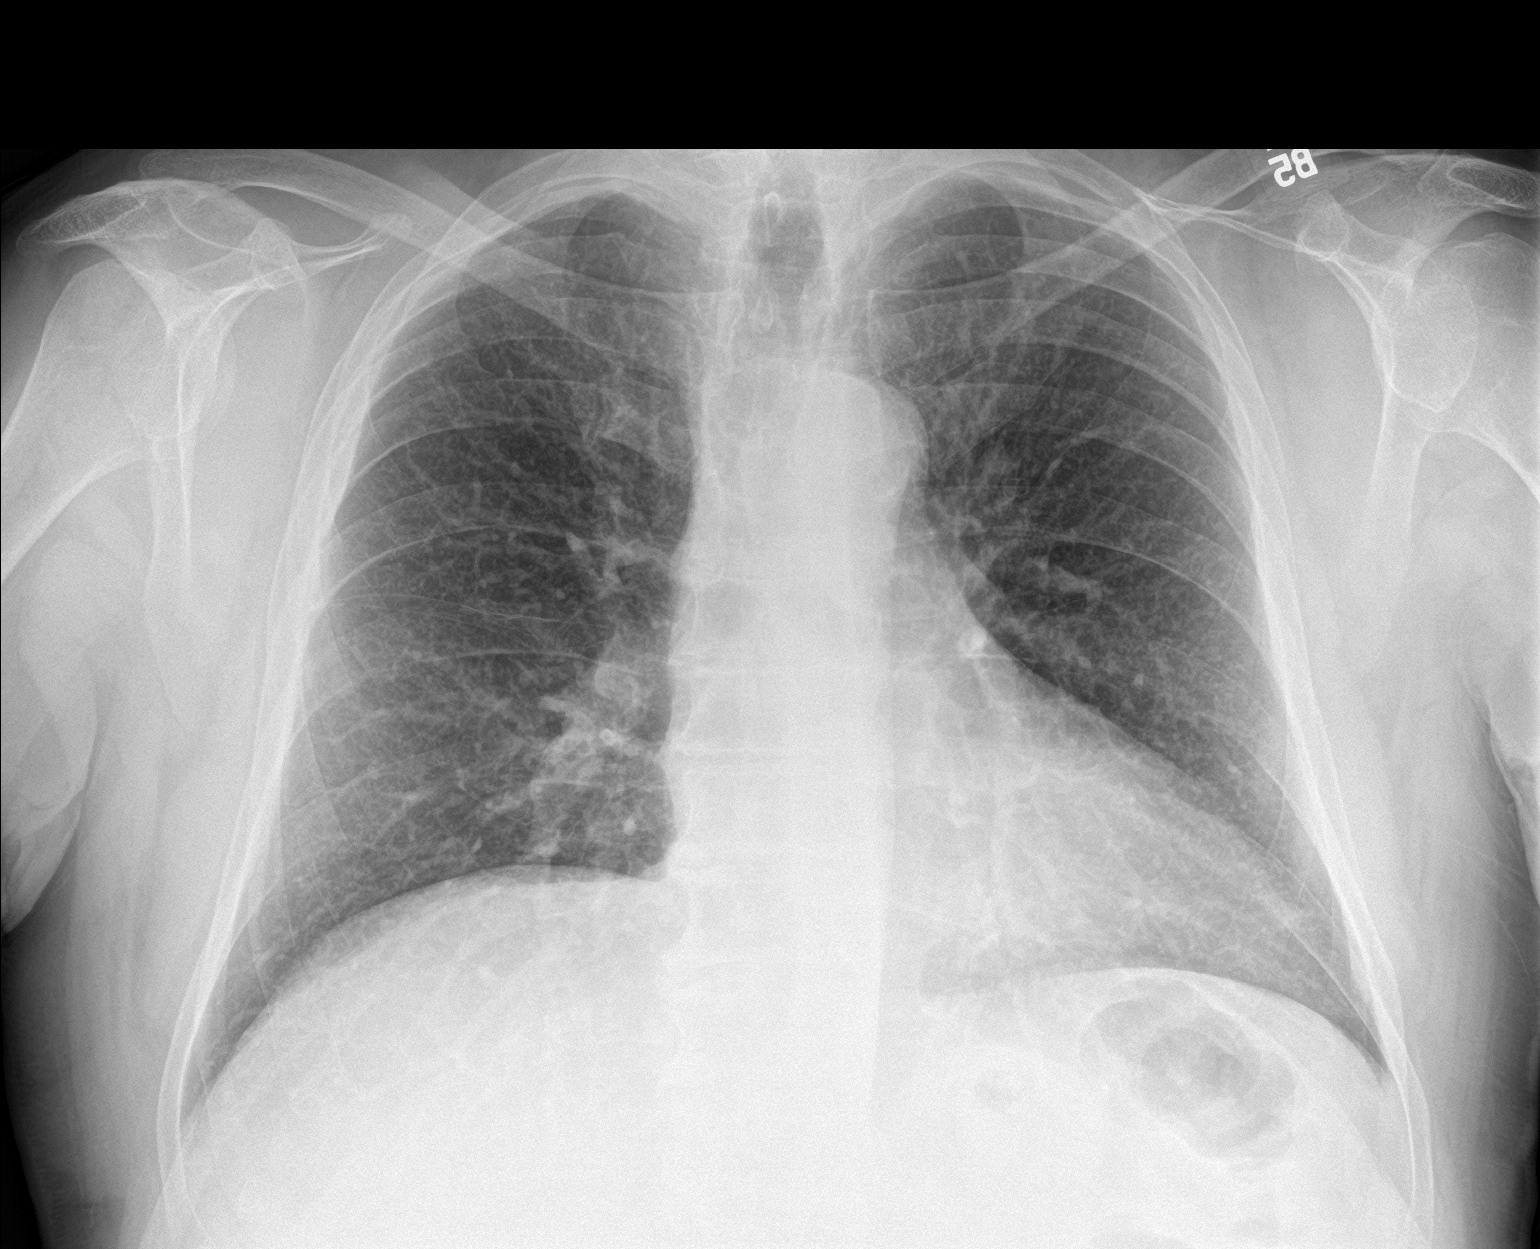

[chest lat]
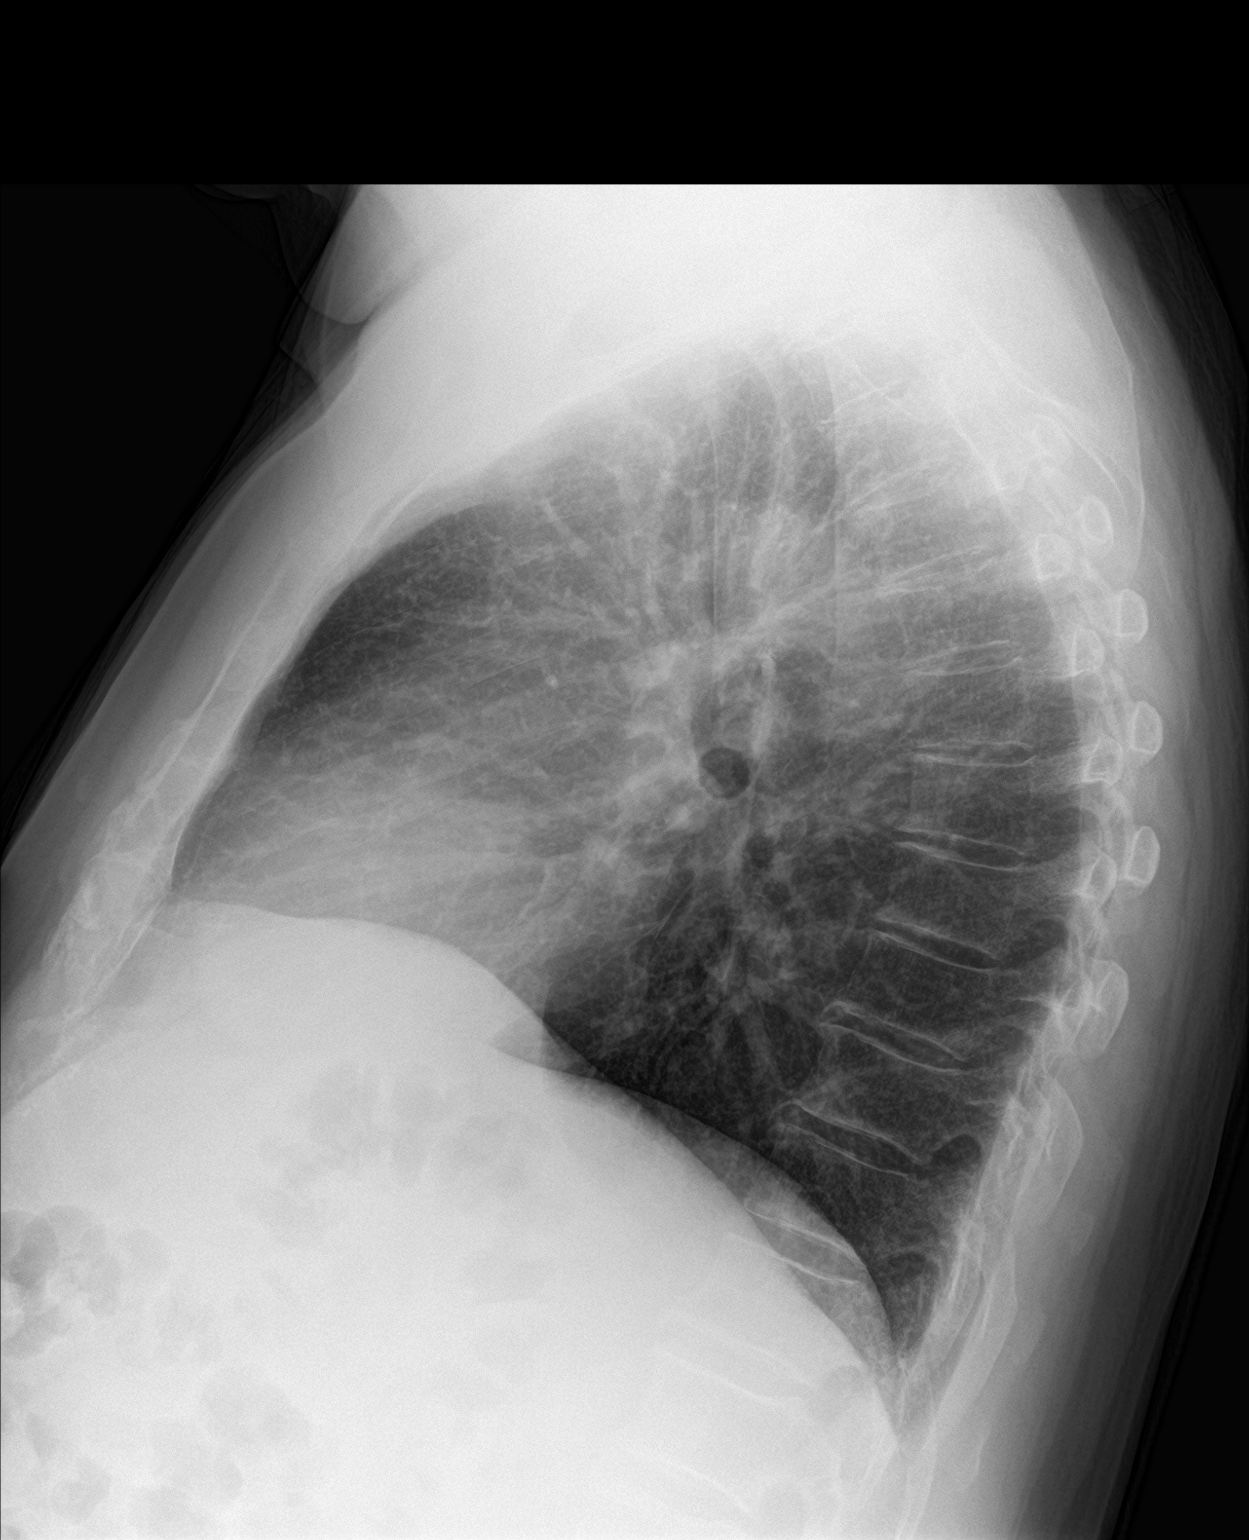

[2 of 2 positions shown; findings below may reference images not displayed]

FINDINGS: Heart size is normal with some left ventricular prominence. Mild
tortuosity of the aorta. Pulmonary vascularity is normal. The lungs
are clear. No infiltrate, collapse or effusion.
IMPRESSION: No active cardiopulmonary disease.

## 2019-11-16 ENCOUNTER — Telehealth: Payer: Self-pay | Admitting: Internal Medicine

## 2019-11-16 ENCOUNTER — Other Ambulatory Visit (INDEPENDENT_AMBULATORY_CARE_PROVIDER_SITE_OTHER): Payer: Medicare Other

## 2019-11-16 ENCOUNTER — Other Ambulatory Visit: Payer: Self-pay

## 2019-11-16 DIAGNOSIS — E7800 Pure hypercholesterolemia, unspecified: Secondary | ICD-10-CM

## 2019-11-16 LAB — LIPID PANEL
Cholesterol: 201 mg/dL — ABNORMAL HIGH (ref 0–200)
HDL: 33.8 mg/dL — ABNORMAL LOW (ref >39.00)
NonHDL: 167.62
Total CHOL/HDL Ratio: 6
Triglycerides: 272 mg/dL — ABNORMAL HIGH (ref 0.0–149.0)
VLDL: 54.4 mg/dL — ABNORMAL HIGH (ref 0.0–40.0)

## 2019-11-16 LAB — COMPREHENSIVE METABOLIC PANEL
ALT: 27 U/L (ref 0–53)
AST: 21 U/L (ref 0–37)
Albumin: 4.3 g/dL (ref 3.5–5.2)
Alkaline Phosphatase: 92 U/L (ref 39–117)
BUN: 16 mg/dL (ref 6–23)
CO2: 29 mEq/L (ref 19–32)
Calcium: 9.4 mg/dL (ref 8.4–10.5)
Chloride: 104 mEq/L (ref 96–112)
Creatinine, Ser: 0.99 mg/dL (ref 0.40–1.50)
GFR: 72.31 mL/min (ref >60.00)
Glucose, Bld: 89 mg/dL (ref 70–99)
Potassium: 4.1 mEq/L (ref 3.5–5.1)
Sodium: 139 mEq/L (ref 135–145)
Total Bilirubin: 0.7 mg/dL (ref 0.2–1.2)
Total Protein: 7 g/dL (ref 6.0–8.3)

## 2019-11-16 LAB — LDL CHOLESTEROL, DIRECT: Direct LDL: 134 mg/dL

## 2019-11-16 NOTE — Telephone Encounter (Signed)
Pt dropped off a copy of covid-19 vaccination record card. Placed to be delivered to Dr. Bary Leriche inbox.

## 2019-11-21 ENCOUNTER — Encounter: Payer: Self-pay | Admitting: Internal Medicine

## 2020-04-12 ENCOUNTER — Ambulatory Visit (INDEPENDENT_AMBULATORY_CARE_PROVIDER_SITE_OTHER): Payer: Medicare Other | Admitting: Internal Medicine

## 2020-04-12 ENCOUNTER — Other Ambulatory Visit: Payer: Self-pay

## 2020-04-12 VITALS — BP 136/84 | HR 67 | Temp 98.3°F | Resp 16 | Ht 68.0 in | Wt 189.0 lb

## 2020-04-12 DIAGNOSIS — E7800 Pure hypercholesterolemia, unspecified: Secondary | ICD-10-CM

## 2020-04-12 DIAGNOSIS — Z125 Encounter for screening for malignant neoplasm of prostate: Secondary | ICD-10-CM

## 2020-04-12 DIAGNOSIS — D696 Thrombocytopenia, unspecified: Secondary | ICD-10-CM

## 2020-04-12 LAB — CBC WITH DIFFERENTIAL/PLATELET
Basophils Absolute: 0 10*3/uL (ref 0.0–0.1)
Basophils Relative: 0.7 % (ref 0.0–3.0)
Eosinophils Absolute: 0.1 10*3/uL (ref 0.0–0.7)
Eosinophils Relative: 3.2 % (ref 0.0–5.0)
HCT: 46.3 % (ref 39.0–52.0)
Hemoglobin: 15.4 g/dL (ref 13.0–17.0)
Lymphocytes Relative: 22 % (ref 12.0–46.0)
Lymphs Abs: 1 10*3/uL (ref 0.7–4.0)
MCHC: 33.3 g/dL (ref 30.0–36.0)
MCV: 91.9 fl (ref 78.0–100.0)
Monocytes Absolute: 0.5 10*3/uL (ref 0.1–1.0)
Monocytes Relative: 9.7 % (ref 3.0–12.0)
Neutro Abs: 3 10*3/uL (ref 1.4–7.7)
Neutrophils Relative %: 64.4 % (ref 43.0–77.0)
Platelets: 169 10*3/uL (ref 150.0–400.0)
RBC: 5.04 Mil/uL (ref 4.22–5.81)
RDW: 13.7 % (ref 11.5–15.5)
WBC: 4.7 10*3/uL (ref 4.0–10.5)

## 2020-04-12 LAB — COMPREHENSIVE METABOLIC PANEL
ALT: 24 U/L (ref 0–53)
AST: 20 U/L (ref 0–37)
Albumin: 4.3 g/dL (ref 3.5–5.2)
Alkaline Phosphatase: 90 U/L (ref 39–117)
BUN: 15 mg/dL (ref 6–23)
CO2: 30 mEq/L (ref 19–32)
Calcium: 9.4 mg/dL (ref 8.4–10.5)
Chloride: 102 mEq/L (ref 96–112)
Creatinine, Ser: 0.9 mg/dL (ref 0.40–1.50)
GFR: 80.64 mL/min (ref >60.00)
Glucose, Bld: 87 mg/dL (ref 70–99)
Potassium: 5 mEq/L (ref 3.5–5.1)
Sodium: 139 mEq/L (ref 135–145)
Total Bilirubin: 0.8 mg/dL (ref 0.2–1.2)
Total Protein: 6.5 g/dL (ref 6.0–8.3)

## 2020-04-12 LAB — PSA, MEDICARE: PSA: 0.31 ng/ml (ref 0.10–4.00)

## 2020-04-12 LAB — LIPID PANEL
Cholesterol: 205 mg/dL — ABNORMAL HIGH (ref 0–200)
HDL: 32.7 mg/dL — ABNORMAL LOW (ref >39.00)
NonHDL: 172.77
Total CHOL/HDL Ratio: 6
Triglycerides: 227 mg/dL — ABNORMAL HIGH (ref 0.0–149.0)
VLDL: 45.4 mg/dL — ABNORMAL HIGH (ref 0.0–40.0)

## 2020-04-12 LAB — LDL CHOLESTEROL, DIRECT: Direct LDL: 144 mg/dL

## 2020-04-12 LAB — TSH: TSH: 3.95 u[IU]/mL (ref 0.35–4.50)

## 2020-04-12 NOTE — Progress Notes (Signed)
[name], male   DOB: 08/16/1935, 85 y.o.   MRN: [id number]   Subjective:    [name], male    DOB: Jun 24, 1935, 85 y.o.   MRN: [id number]  HPI This visit occurred during the SARS-CoV-2 public health emergency.  Safety protocols were in place, including screening questions prior to the visit, additional usage of staff PPE, and extensive cleaning of exam room while observing appropriate contact time as indicated for disinfecting solutions.  Patient here for a scheduled follow up.  Had dental surgery two days ago.  Has not been able to eat as well.  Gradually advancing his diet.  Approximately 10-14 days ago, he developed a dry cough.  Neck was swollen.  Some diarrhea and weakness.  States legs felt weak.  This lasted for two days and then resolved.  Apparently was around children at church and felt possibly could have picked up something from one of them.  Has had covid vaccines.  Feels completely normal now.  No nausea or vomiting.  Bowels back to normal.  No chest pain, cough or sob.  No chest congestion.  No fever.  Overall feels good.  Playing golf.  Stays active.    Past Medical History:  Diagnosis Date  . Acoustic neuroma ([place])   . Arthritis    hands  . Cochlear implant status    bilateral  . Complication of anesthesia    difficult intubation for one of his "head surgeries at [facility]" per pt.  . History of colonic polyps   . History of craniotomy   . Hypercholesterolemia   . Macular degeneration   . Psoriasis   . Rheumatic fever age 60  . Rosacea   . Scarlet fever age 74  . Vertigo   . Wears dentures    full upper, partial lower   Past Surgical History:  Procedure Laterality Date  . APPENDECTOMY  1959  . BUNIONECTOMY    . CATARACT EXTRACTION W/PHACO Left 03/27/2019   Procedure: CATARACT EXTRACTION PHACO AND INTRAOCULAR LENS PLACEMENT ([place])  LEFT;  Surgeon: Eulogio Bear, MD;  Location: [place];  Service: Ophthalmology;   Laterality: Left;  IVA BLOCK  . COCHLEAR IMPLANT     acute hearing loss  . CRANIECTOMY FOR EXCISION OF ACOUSTIC NEUROMA  1994  . craniotomy for meningoma     Family History  Problem Relation Age of Onset  . Hypertension Mother   . Heart disease Mother   . Heart failure Mother   . Cancer Father 7       prostate  . Stroke Father   . Heart disease Brother 61       CABG  . Stroke Son   . Colon cancer Neg Hx    Social History   Socioeconomic History  . Marital status: Divorced    Spouse name: Not on file  . Number of children: 3  . Years of education: Not on file  . Highest education level: Not on file  Occupational History  . Not on file  Tobacco Use  . Smoking status: Former Smoker    Packs/day: 1.50    Years: 40.00    Pack years: 60.00    Quit date: 10/05/2000    Years since quitting: 19.5  . Smokeless tobacco: Former Systems developer    Quit date: Biochemist, clinical  . Vaping Use: Never used  Substance and Sexual Activity  . Alcohol use: Yes    Alcohol/week: 0.0 standard  drinks    Comment: occasional wine  . Drug use: No  . Sexual activity: Not on file  Other Topics Concern  . Not on file  Social History Narrative  . Not on file   Social Determinants of Health   Financial Resource Strain:   . Difficulty of Paying Living Expenses:   Food Insecurity:   . Worried About Charity fundraiser in the Last Year:   . Arboriculturist in the Last Year:   Transportation Needs:   . Film/video editor (Medical):   Marland Kitchen Lack of Transportation (Non-Medical):   Physical Activity:   . Days of Exercise per Week:   . Minutes of Exercise per Session:   Stress:   . Feeling of Stress :   Social Connections: Moderately Integrated  . Frequency of Communication with Friends and Family: More than three times a week  . Frequency of Social Gatherings with Friends and Family: Twice a week  . Attends Religious Services: More than 4 times per year  . Active Member of Clubs or Organizations: Yes   . Attends Archivist Meetings: More than 4 times per year  . Marital Status: Divorced    Outpatient Encounter Medications as of 04/12/2020  Medication Sig  . aspirin EC 81 MG tablet Take 81 mg by mouth daily.  Marland Kitchen docusate sodium (COLACE) 100 MG capsule Take 100 mg by mouth 2 (two) times daily as needed for mild constipation.  . fluticasone (FLONASE) 50 MCG/ACT nasal spray Place 2 sprays into both nostrils daily.  Marland Kitchen Ketotifen Fumarate (ZADITOR OP) Apply to eye daily as needed.  . Multiple Vitamins-Minerals (VISION-VITE PRESERVE PO) Take by mouth.  . timolol (TIMOPTIC) 0.5 % ophthalmic solution Place 1 drop into both eyes daily.  Marland Kitchen triamcinolone cream (KENALOG) 0.1 % Apply 1 application topically as needed (for Rosacea).   No facility-administered encounter medications on file as of 04/12/2020.    Review of Systems  Constitutional: Negative for appetite change and unexpected weight change.  HENT: Negative for congestion and sinus pressure.   Respiratory: Negative for cough, chest tightness and shortness of breath.   Cardiovascular: Negative for chest pain, palpitations and leg swelling.  Gastrointestinal: Negative for abdominal pain, diarrhea, nausea and vomiting.  Genitourinary: Negative for difficulty urinating and dysuria.  Musculoskeletal: Negative for joint swelling and myalgias.  Skin: Negative for color change and rash.  Neurological: Negative for dizziness, light-headedness and headaches.  Psychiatric/Behavioral: Negative for agitation and dysphoric mood.       Objective:    Physical Exam Vitals reviewed.  Constitutional:      General: He is not in acute distress.    Appearance: He is well-developed.  HENT:     Head: Normocephalic and atraumatic.     Right Ear: External ear normal.     Left Ear: External ear normal.  Eyes:     General: No scleral icterus.       Right eye: No discharge.        Left eye: No discharge.     Conjunctiva/sclera: Conjunctivae  normal.  Cardiovascular:     Rate and Rhythm: Normal rate and regular rhythm.  Pulmonary:     Effort: Pulmonary effort is normal. No respiratory distress.     Breath sounds: Normal breath sounds.  Abdominal:     General: Bowel sounds are normal.     Palpations: Abdomen is soft.     Tenderness: There is no abdominal tenderness.  Musculoskeletal:  General: No swelling or tenderness.     Cervical back: Neck supple. No tenderness.  Lymphadenopathy:     Cervical: No cervical adenopathy.  Skin:    Findings: No erythema or rash.  Neurological:     Mental Status: He is alert.  Psychiatric:        Mood and Affect: Mood normal.        Behavior: Behavior normal.     BP 136/84   Pulse 67   Temp 98.3 F (36.8 C)   Resp 16   Ht 5\' 8"  (1.727 m)   Wt 189 lb (85.7 kg)   SpO2 97%   BMI 28.74 kg/m  Wt Readings from Last 3 Encounters:  04/12/20 189 lb (85.7 kg)  04/12/19 195 lb (88.5 kg)  03/27/19 195 lb (88.5 kg)     Lab Results  Component Value Date   WBC 4.7 04/12/2020   HGB 15.4 04/12/2020   HCT 46.3 04/12/2020   PLT 169.0 04/12/2020   GLUCOSE 87 04/12/2020   CHOL 205 (H) 04/12/2020   TRIG 227.0 (H) 04/12/2020   HDL 32.70 (L) 04/12/2020   LDLDIRECT 144.0 04/12/2020   LDLCALC 141 (H) 01/20/2018   ALT 24 04/12/2020   AST 20 04/12/2020   NA 139 04/12/2020   K 5.0 04/12/2020   CL 102 04/12/2020   CREATININE 0.90 04/12/2020   BUN 15 04/12/2020   CO2 30 04/12/2020   TSH 3.95 04/12/2020   PSA 0.31 04/12/2020       Assessment & Plan:   Problem List Items Addressed This Visit    Hypercholesterolemia    Had intolerance to lipitor and crestor.  Have discussed zetia.  Has preferred not to try another cholesterol medication.  Low cholesterol diet and exercise.  Follow lipid panel and liver function tests.        Relevant Orders   Comprehensive metabolic panel (Completed)   Lipid panel (Completed)   TSH (Completed)   Thrombocytopenia (HCC) - Primary    Recheck  cbc to confirm stable.        Relevant Orders   CBC with Differential/Platelet (Completed)    Other Visit Diagnoses    Prostate cancer screening       Relevant Orders   PSA, Medicare (Completed)       Einar Pheasant, MD

## 2020-04-13 ENCOUNTER — Encounter: Payer: Self-pay | Admitting: Internal Medicine

## 2020-04-13 ENCOUNTER — Other Ambulatory Visit: Payer: Self-pay | Admitting: Internal Medicine

## 2020-04-13 DIAGNOSIS — E875 Hyperkalemia: Secondary | ICD-10-CM

## 2020-04-13 NOTE — Progress Notes (Signed)
Order placed for f/u potassium check.  

## 2020-04-13 NOTE — Assessment & Plan Note (Signed)
Recheck cbc to confirm stable.  

## 2020-04-13 NOTE — Assessment & Plan Note (Signed)
Had intolerance to lipitor and crestor.  Have discussed zetia.  Has preferred not to try another cholesterol medication.  Low cholesterol diet and exercise.  Follow lipid panel and liver function tests.

## 2020-04-24 ENCOUNTER — Other Ambulatory Visit: Payer: Self-pay

## 2020-04-24 ENCOUNTER — Other Ambulatory Visit (INDEPENDENT_AMBULATORY_CARE_PROVIDER_SITE_OTHER): Payer: Medicare Other

## 2020-04-24 DIAGNOSIS — E875 Hyperkalemia: Secondary | ICD-10-CM

## 2020-04-24 LAB — POTASSIUM: Potassium: 4.2 mEq/L (ref 3.5–5.1)

## 2020-07-17 ENCOUNTER — Ambulatory Visit (INDEPENDENT_AMBULATORY_CARE_PROVIDER_SITE_OTHER): Payer: Medicare Other

## 2020-07-17 VITALS — Ht 68.0 in | Wt 189.0 lb

## 2020-07-17 DIAGNOSIS — Z0000 Encounter for general adult medical examination without abnormal findings: Secondary | ICD-10-CM

## 2020-07-17 NOTE — Progress Notes (Addendum)
Subjective:   Benjamin Carney is a 85 y.o. male who presents for Medicare Annual/Subsequent preventive examination.  Review of Systems    No ROS.  Medicare Wellness Virtual Visit.    Cardiac Risk Factors include: advanced age (>69men, >48 women);male gender     Objective:    Today's Vitals   07/17/20 0833  Weight: 189 lb (85.7 kg)  Height: 5\' 8"  (1.727 m)   Body mass index is 28.74 kg/m.  Advanced Directives 07/17/2019 03/27/2019 07/11/2018 07/08/2017  Does Patient Have a Medical Advance Directive? Yes Yes Yes Yes  Type of Paramedic of Benjamin Carney;Living will Benjamin Carney;Living will Benjamin Carney;Living will Benjamin Carney;Living will  Does patient want to make changes to medical advance directive? No - Patient declined No - Patient declined No - Patient declined No - Patient declined  Copy of [place] in Chart? No - copy requested Yes - validated most recent copy scanned in chart (See row information) No - copy requested No - copy requested    Current Medications (verified) Outpatient Encounter Medications as of 07/17/2020  Medication Sig  . aspirin EC 81 MG tablet Take 81 mg by mouth daily.  Marland Kitchen docusate sodium (COLACE) 100 MG capsule Take 100 mg by mouth 2 (two) times daily as needed for mild constipation.  . fluticasone (FLONASE) 50 MCG/ACT nasal spray Place 2 sprays into both nostrils daily.  Marland Kitchen Ketotifen Fumarate (ZADITOR OP) Apply to eye daily as needed.  . Multiple Vitamins-Minerals (VISION-VITE PRESERVE PO) Take by mouth.  . timolol (TIMOPTIC) 0.5 % ophthalmic solution Place 1 drop into both eyes daily.  Marland Kitchen triamcinolone cream (KENALOG) 0.1 % Apply 1 application topically as needed (for Rosacea).   No facility-administered encounter medications on file as of 07/17/2020.    Allergies (verified) Crestor [rosuvastatin calcium] and Metronidazole   History: Past Medical History:    Diagnosis Date  . Acoustic neuroma ([place])   . Arthritis    hands  . Cochlear implant status    bilateral  . Complication of anesthesia    difficult intubation for one of his "head surgeries at [facility]" per pt.  . History of colonic polyps   . History of craniotomy   . Hypercholesterolemia   . Macular degeneration   . Psoriasis   . Rheumatic fever age 65  . Rosacea   . Scarlet fever age 19  . Vertigo   . Wears dentures    full upper, partial lower   Past Surgical History:  Procedure Laterality Date  . APPENDECTOMY  1959  . BUNIONECTOMY    . CATARACT EXTRACTION W/PHACO Left 03/27/2019   Procedure: CATARACT EXTRACTION PHACO AND INTRAOCULAR LENS PLACEMENT ([facility])  LEFT;  Surgeon: Eulogio Bear, MD;  Location: [facility];  Service: Ophthalmology;  Laterality: Left;  IVA BLOCK  . COCHLEAR IMPLANT     acute hearing loss  . CRANIECTOMY FOR EXCISION OF ACOUSTIC NEUROMA  1994  . craniotomy for meningoma     Family History  Problem Relation Age of Onset  . Hypertension Mother   . Heart disease Mother   . Heart failure Mother   . Cancer Father 58       prostate  . Stroke Father   . Heart disease Brother 21       CABG  . Stroke Son   . Colon cancer Neg Hx    Social History   Socioeconomic History  . Marital status:  Divorced    Spouse name: Not on file  . Number of children: 3  . Years of education: Not on file  . Highest education level: Not on file  Occupational History  . Not on file  Tobacco Use  . Smoking status: Former Smoker    Packs/day: 1.50    Years: 40.00    Pack years: 60.00    Quit date: 10/05/2000    Years since quitting: 19.7  . Smokeless tobacco: Former Systems developer    Quit date: Biochemist, clinical  . Vaping Use: Never used  Substance and Sexual Activity  . Alcohol use: Yes    Alcohol/week: 0.0 standard drinks    Comment: occasional wine  . Drug use: No  . Sexual activity: Not on file  Other Topics Concern  . Not on file  Social History Narrative   . Not on file   Social Determinants of Health   Financial Resource Strain: Low Risk   . Difficulty of Paying Living Expenses: Not hard at all  Food Insecurity: No Food Insecurity  . Worried About Charity fundraiser in the Last Year: Never true  . Ran Out of Food in the Last Year: Never true  Transportation Needs: No Transportation Needs  . Lack of Transportation (Medical): No  . Lack of Transportation (Non-Medical): No  Physical Activity:   . Days of Exercise per Week: Not on file  . Minutes of Exercise per Session: Not on file  Stress: No Stress Concern Present  . Feeling of Stress : Not at all  Social Connections: Moderately Integrated  . Frequency of Communication with Friends and Family: More than three times a week  . Frequency of Social Gatherings with Friends and Family: More than three times a week  . Attends Religious Services: More than 4 times per year  . Active Member of Clubs or Organizations: Yes  . Attends Archivist Meetings: More than 4 times per year  . Marital Status: Divorced    Tobacco Counseling Counseling given: Not Answered   Clinical Intake:  Pre-visit preparation completed: Yes        Diabetes: No  How often do you need to have someone help you when you read instructions, pamphlets, or other written materials from your doctor or pharmacy?: 1 - Never  Interpreter Needed?: No      Activities of Daily Living In your present state of health, do you have any difficulty performing the following activities: 07/17/2020  Hearing? Y  Vision? N  Difficulty concentrating or making decisions? N  Walking or climbing stairs? N  Dressing or bathing? N  Doing errands, shopping? N  Preparing Food and eating ? N  Using the Toilet? N  In the past six months, have you accidently leaked urine? N  Do you have problems with loss of bowel control? N  Managing your Medications? N  Managing your Finances? N  Housekeeping or managing your  Housekeeping? N  Some recent data might be hidden    Patient Care Team: Einar Pheasant, MD as PCP - General (Internal Medicine)  Indicate any recent Medical Services you may have received from other than [facility] providers in the past year (date may be approximate).     Assessment:   This is a routine wellness examination for Benjamin Carney.  I connected with Justino today by telephone and verified that I am speaking with the correct person using two identifiers. Location patient: home Location provider: work Persons participating in the virtual  visit: patient, nurse.    I discussed the limitations, risks, security and privacy concerns of performing an evaluation and management service by telephone and the availability of in person appointments. The patient expressed understanding and verbally consented to this telephonic visit.    Interactive audio and video telecommunications were attempted between this provider and patient, however failed, due to patient having technical difficulties OR patient did not have access to video capability.  We continued and completed visit with audio only.  Some vital signs may be absent or patient reported.   Hearing/Vision screen  Hearing Screening   125Hz  250Hz  500Hz  1000Hz  2000Hz  3000Hz  4000Hz  6000Hz  8000Hz   Right ear:           Left ear:           Comments: Hearing aid, bilateral   Vision Screening Comments: Followed by [facility]  Wears corrective lenses  Visits every 6 months.  Cataract extraction, R eye Macular degeneration  Visual acuity not assessed per patient preference since they have regular follow up with the ophthalmologist  Dietary issues and exercise activities discussed: Current Exercise Habits: Home exercise routine, Type of exercise: walking, Intensity: Mild Healthy diet Good water Intake  Goals      Patient Stated   .  Maintain Healthy Lifestyle (pt-stated)      Stay active Stay hydrated Healthy diet       Depression Screen PHQ 2/9 Scores 07/17/2020 07/17/2019 07/11/2018 07/08/2017 07/14/2016 12/17/2014 12/07/2013  PHQ - 2 Score 0 0 0 0 0 0 0    Fall Risk Fall Risk  07/17/2020 07/17/2019 07/11/2018 07/08/2017 07/14/2016  Falls in the past year? 0 0 No No No  Number falls in past yr: 0 - - - -  Follow up Falls evaluation completed - - - -   Handrails in use when climbing stairs? Yes Home free of loose throw rugs in walkways, pet beds, electrical cords, etc? Yes  Adequate lighting in your home to reduce risk of falls? Yes   ASSISTIVE DEVICES UTILIZED TO PREVENT FALLS: Use of a cane, walker or w/c? Yes   TIMED UP AND GO: Was the test performed? No . Virtual visit.   Cognitive Function: Patient is alert and oriented x3.  Denies difficulty focusing, making decisions, memory loss.  Enjoys brain challenging activities like sodoku.   MMSE - Mini Mental State Exam 07/11/2018  Orientation to time 5  Orientation to Place 5  Registration 3  Attention/ Calculation 5  Recall 3  Language- name 2 objects 2  Language- repeat 1  Language- follow 3 step command 3  Language- read & follow direction 1  Write a sentence 1  Copy design 1  Total score 30     6CIT Screen 07/17/2019 07/08/2017  What Year? 0 points 0 points  What month? 0 points 0 points  What time? 0 points 0 points  Count back from 20 0 points 0 points  Months in reverse 0 points 0 points  Repeat phrase 0 points 0 points  Total Score 0 0   Immunizations Immunization History  Administered Date(s) Administered  . Fluad Quad(high Dose 65+) 06/16/2019  . Influenza Split 09/11/2011, 08/15/2012  . Influenza, High Dose Seasonal PF 07/21/2017, 07/14/2018  . Influenza,inj,Quad PF,6+ Mos 08/25/2013, 10/10/2014, 09/03/2015, 07/29/2016  . PFIZER SARS-COV-2 Vaccination 10/12/2019, 11/04/2019  . Pneumococcal Conjugate-13 12/07/2013  . Pneumococcal Polysaccharide-23 06/05/2009  . Zoster 09/22/2010   TDAP status: Due, Education has been  provided regarding the importance of this  vaccine. Advised may receive this vaccine at local pharmacy or Health Dept. Aware to provide a copy of the vaccination record if obtained from local pharmacy or Health Dept. Verbalized acceptance and understanding. Deferred.  Health Maintenance Health Maintenance  Topic Date Due  . TETANUS/TDAP  Never done  . COVID-19 Vaccine  Completed  . PNA vac Low Risk Adult  Completed  . INFLUENZA VACCINE  Discontinued   Dental Screening: Recommended annual dental exams for proper oral hygiene. Visits clinic at [facility] every 2 weeks; dental plate. Implant surgery to be scheduled.   Community Resource Referral / Chronic Care Management: CRR required this visit?  No   CCM required this visit?  No      Plan:   Keep all routine maintenance appointments.   Cpe 08/15/20 @ 9:00  I have personally reviewed and noted the following in the patient's chart:   . Medical and social history . Use of alcohol, tobacco or illicit drugs  . Current medications and supplements . Functional ability and status . Nutritional status . Physical activity . Advanced directives . List of other physicians . Hospitalizations, surgeries, and ER visits in previous 12 months . Vitals . Screenings to include cognitive, depression, and falls . Referrals and appointments  In addition, I have reviewed and discussed with patient certain preventive protocols, quality metrics, and best practice recommendations. A written personalized care plan for preventive services as well as general preventive health recommendations were provided to patient via mychart.     Varney Biles, LPN   [id number]   Reviewed above information.  Agree with assessment and plan.   Dr Nicki Reaper

## 2020-07-17 NOTE — Patient Instructions (Addendum)
Benjamin Carney , Thank you for taking time to come for your Medicare Wellness Visit. I appreciate your ongoing commitment to your health goals. Please review the following plan we discussed and let me know if I can assist you in the future.   These are the goals we discussed: Goals      Patient Stated   .  Maintain Healthy Lifestyle (pt-stated)      Stay active Stay hydrated Healthy diet       This is a list of the screening recommended for you and due dates:  Health Maintenance  Topic Date Due  . Tetanus Vaccine  Never done  . COVID-19 Vaccine  Completed  . Pneumonia vaccines  Completed  . Flu Shot  Discontinued   Immunizations Immunization History  Administered Date(s) Administered  . Fluad Quad(high Dose 65+) 06/16/2019  . Influenza Split 09/11/2011, 08/15/2012  . Influenza, High Dose Seasonal PF 07/21/2017, 07/14/2018  . Influenza,inj,Quad PF,6+ Mos 08/25/2013, 10/10/2014, 09/03/2015, 07/29/2016  . PFIZER SARS-COV-2 Vaccination 10/12/2019, 11/04/2019  . Pneumococcal Conjugate-13 12/07/2013  . Pneumococcal Polysaccharide-23 06/05/2009  . Zoster 09/22/2010   Keep all routine maintenance appointments.   Cpe 08/15/20 @ 9:00  Advanced directives: End of life planning; Advance aging; Advanced directives discussed.  Copy of current HCPOA/Living Will requested.    Conditions/risks identified: none new.  Follow up in one year for your annual wellness visit.   Preventive Care 44 Years and Older, Male Preventive care refers to lifestyle choices and visits with your health care provider that can promote health and wellness. What does preventive care include?  A yearly physical exam. This is also called an annual well check.  Dental exams once or twice a year.  Routine eye exams. Ask your health care provider how often you should have your eyes checked.  Personal lifestyle choices, including:  Daily care of your teeth and gums.  Regular physical activity.  Eating a  healthy diet.  Avoiding tobacco and drug use.  Limiting alcohol use.  Practicing safe sex.  Taking low doses of aspirin every day.  Taking vitamin and mineral supplements as recommended by your health care provider. What happens during an annual well check? The services and screenings done by your health care provider during your annual well check will depend on your age, overall health, lifestyle risk factors, and family history of disease. Counseling  Your health care provider may ask you questions about your:  Alcohol use.  Tobacco use.  Drug use.  Emotional well-being.  Home and relationship well-being.  Sexual activity.  Eating habits.  History of falls.  Memory and ability to understand (cognition).  Work and work Statistician. Screening  You may have the following tests or measurements:  Height, weight, and BMI.  Blood pressure.  Lipid and cholesterol levels. These may be checked every 5 years, or more frequently if you are over 83 years old.  Skin check.  Lung cancer screening. You may have this screening every year starting at age 28 if you have a 30-pack-year history of smoking and currently smoke or have quit within the past 15 years.  Fecal occult blood test (FOBT) of the stool. You may have this test every year starting at age 48.  Flexible sigmoidoscopy or colonoscopy. You may have a sigmoidoscopy every 5 years or a colonoscopy every 10 years starting at age 45.  Prostate cancer screening. Recommendations will vary depending on your family history and other risks.  Hepatitis C blood test.  Hepatitis B  blood test.  Sexually transmitted disease (STD) testing.  Diabetes screening. This is done by checking your blood sugar (glucose) after you have not eaten for a while (fasting). You may have this done every 1-3 years.  Abdominal aortic aneurysm (AAA) screening. You may need this if you are a current or former smoker.  Osteoporosis. You may be  screened starting at age 73 if you are at high risk. Talk with your health care provider about your test results, treatment options, and if necessary, the need for more tests. Vaccines  Your health care provider may recommend certain vaccines, such as:  Influenza vaccine. This is recommended every year.  Tetanus, diphtheria, and acellular pertussis (Tdap, Td) vaccine. You may need a Td booster every 10 years.  Zoster vaccine. You may need this after age 4.  Pneumococcal 13-valent conjugate (PCV13) vaccine. One dose is recommended after age 28.  Pneumococcal polysaccharide (PPSV23) vaccine. One dose is recommended after age 59. Talk to your health care provider about which screenings and vaccines you need and how often you need them. This information is not intended to replace advice given to you by your health care provider. Make sure you discuss any questions you have with your health care provider. Document Released: 10/18/2015 Document Revised: 06/10/2016 Document Reviewed: 07/23/2015 Elsevier Interactive Patient Education  2017 [place] Prevention in the Home Falls can cause injuries. They can happen to people of all ages. There are many things you can do to make your home safe and to help prevent falls. What can I do on the outside of my home?  Regularly fix the edges of walkways and driveways and fix any cracks.  Remove anything that might make you trip as you walk through a door, such as a raised step or threshold.  Trim any bushes or trees on the path to your home.  Use bright outdoor lighting.  Clear any walking paths of anything that might make someone trip, such as rocks or tools.  Regularly check to see if handrails are loose or broken. Make sure that both sides of any steps have handrails.  Any raised decks and porches should have guardrails on the edges.  Have any leaves, snow, or ice cleared regularly.  Use sand or salt on walking paths during  winter.  Clean up any spills in your garage right away. This includes oil or grease spills. What can I do in the bathroom?  Use night lights.  Install grab bars by the toilet and in the tub and shower. Do not use towel bars as grab bars.  Use non-skid mats or decals in the tub or shower.  If you need to sit down in the shower, use a plastic, non-slip stool.  Keep the floor dry. Clean up any water that spills on the floor as soon as it happens.  Remove soap buildup in the tub or shower regularly.  Attach bath mats securely with double-sided non-slip rug tape.  Do not have throw rugs and other things on the floor that can make you trip. What can I do in the bedroom?  Use night lights.  Make sure that you have a light by your bed that is easy to reach.  Do not use any sheets or blankets that are too big for your bed. They should not hang down onto the floor.  Have a firm chair that has side arms. You can use this for support while you get dressed.  Do not have throw  rugs and other things on the floor that can make you trip. What can I do in the kitchen?  Clean up any spills right away.  Avoid walking on wet floors.  Keep items that you use a lot in easy-to-reach places.  If you need to reach something above you, use a strong step stool that has a grab bar.  Keep electrical cords out of the way.  Do not use floor polish or wax that makes floors slippery. If you must use wax, use non-skid floor wax.  Do not have throw rugs and other things on the floor that can make you trip. What can I do with my stairs?  Do not leave any items on the stairs.  Make sure that there are handrails on both sides of the stairs and use them. Fix handrails that are broken or loose. Make sure that handrails are as long as the stairways.  Check any carpeting to make sure that it is firmly attached to the stairs. Fix any carpet that is loose or worn.  Avoid having throw rugs at the top or  bottom of the stairs. If you do have throw rugs, attach them to the floor with carpet tape.  Make sure that you have a light switch at the top of the stairs and the bottom of the stairs. If you do not have them, ask someone to add them for you. What else can I do to help prevent falls?  Wear shoes that:  Do not have high heels.  Have rubber bottoms.  Are comfortable and fit you well.  Are closed at the toe. Do not wear sandals.  If you use a stepladder:  Make sure that it is fully opened. Do not climb a closed stepladder.  Make sure that both sides of the stepladder are locked into place.  Ask someone to hold it for you, if possible.  Clearly mark and make sure that you can see:  Any grab bars or handrails.  First and last steps.  Where the edge of each step is.  Use tools that help you move around (mobility aids) if they are needed. These include:  Canes.  Walkers.  Scooters.  Crutches.  Turn on the lights when you go into a dark area. Replace any light bulbs as soon as they burn out.  Set up your furniture so you have a clear path. Avoid moving your furniture around.  If any of your floors are uneven, fix them.  If there are any pets around you, be aware of where they are.  Review your medicines with your doctor. Some medicines can make you feel dizzy. This can increase your chance of falling. Ask your doctor what other things that you can do to help prevent falls. This information is not intended to replace advice given to you by your health care provider. Make sure you discuss any questions you have with your health care provider. Document Released: 07/18/2009 Document Revised: 02/27/2016 Document Reviewed: 10/26/2014 Elsevier Interactive Patient Education  2017 [employer].

## 2020-08-15 ENCOUNTER — Ambulatory Visit (INDEPENDENT_AMBULATORY_CARE_PROVIDER_SITE_OTHER): Payer: Medicare Other | Admitting: Internal Medicine

## 2020-08-15 ENCOUNTER — Other Ambulatory Visit: Payer: Self-pay

## 2020-08-15 VITALS — BP 130/70 | HR 64 | Temp 97.8°F | Resp 16 | Ht 68.0 in | Wt 184.0 lb

## 2020-08-15 DIAGNOSIS — D696 Thrombocytopenia, unspecified: Secondary | ICD-10-CM

## 2020-08-15 DIAGNOSIS — I779 Disorder of arteries and arterioles, unspecified: Secondary | ICD-10-CM

## 2020-08-15 DIAGNOSIS — E7800 Pure hypercholesterolemia, unspecified: Secondary | ICD-10-CM | POA: Diagnosis not present

## 2020-08-15 DIAGNOSIS — Z0000 Encounter for general adult medical examination without abnormal findings: Secondary | ICD-10-CM | POA: Diagnosis not present

## 2020-08-15 LAB — CBC WITH DIFFERENTIAL/PLATELET
Basophils Absolute: 0 10*3/uL (ref 0.0–0.1)
Basophils Relative: 0.7 % (ref 0.0–3.0)
Eosinophils Absolute: 0.2 10*3/uL (ref 0.0–0.7)
Eosinophils Relative: 3.9 % (ref 0.0–5.0)
HCT: 46.3 % (ref 39.0–52.0)
Hemoglobin: 15.5 g/dL (ref 13.0–17.0)
Lymphocytes Relative: 27.8 % (ref 12.0–46.0)
Lymphs Abs: 1.5 10*3/uL (ref 0.7–4.0)
MCHC: 33.5 g/dL (ref 30.0–36.0)
MCV: 91.1 fl (ref 78.0–100.0)
Monocytes Absolute: 0.4 10*3/uL (ref 0.1–1.0)
Monocytes Relative: 6.9 % (ref 3.0–12.0)
Neutro Abs: 3.2 10*3/uL (ref 1.4–7.7)
Neutrophils Relative %: 60.7 % (ref 43.0–77.0)
Platelets: 155 10*3/uL (ref 150.0–400.0)
RBC: 5.08 Mil/uL (ref 4.22–5.81)
RDW: 14 % (ref 11.5–15.5)
WBC: 5.2 10*3/uL (ref 4.0–10.5)

## 2020-08-15 LAB — HEPATIC FUNCTION PANEL
ALT: 25 U/L (ref 0–53)
AST: 21 U/L (ref 0–37)
Albumin: 4.4 g/dL (ref 3.5–5.2)
Alkaline Phosphatase: 94 U/L (ref 39–117)
Bilirubin, Direct: 0.2 mg/dL (ref 0.0–0.3)
Total Bilirubin: 1.1 mg/dL (ref 0.2–1.2)
Total Protein: 6.6 g/dL (ref 6.0–8.3)

## 2020-08-15 LAB — LDL CHOLESTEROL, DIRECT: Direct LDL: 140 mg/dL

## 2020-08-15 LAB — LIPID PANEL
Cholesterol: 201 mg/dL — ABNORMAL HIGH (ref 0–200)
HDL: 33.7 mg/dL — ABNORMAL LOW (ref >39.00)
NonHDL: 167.44
Total CHOL/HDL Ratio: 6
Triglycerides: 253 mg/dL — ABNORMAL HIGH (ref 0.0–149.0)
VLDL: 50.6 mg/dL — ABNORMAL HIGH (ref 0.0–40.0)

## 2020-08-15 LAB — BASIC METABOLIC PANEL
BUN: 16 mg/dL (ref 6–23)
CO2: 32 mEq/L (ref 19–32)
Calcium: 9.1 mg/dL (ref 8.4–10.5)
Chloride: 101 mEq/L (ref 96–112)
Creatinine, Ser: 0.92 mg/dL (ref 0.40–1.50)
GFR: 77.16 mL/min (ref >60.00)
Glucose, Bld: 71 mg/dL (ref 70–99)
Potassium: 4.7 mEq/L (ref 3.5–5.1)
Sodium: 140 mEq/L (ref 135–145)

## 2020-08-15 NOTE — Progress Notes (Signed)
[name], male   DOB: 10-22-1934, 85 y.o.   MRN: [id number]   Subjective:    [name], male    DOB: 1934/11/12, 85 y.o.   MRN: [id number]  HPI This visit occurred during the SARS-CoV-2 public health emergency.  Safety protocols were in place, including screening questions prior to the visit, additional usage of staff PPE, and extensive cleaning of exam room while observing appropriate contact time as indicated for disinfecting solutions.  Patient here for a scheduled physical exam.  He reports he is doing relatively well.  Stays active.  Plays golf.  No chest pain or sob.  No acid reflux.  No abdominal pain or bowel change reported.  Has been seeing dentist. Had recent CT scan.  CT revealed sinusitis, OA of c-spine and carotid atheroma.  Had questions about carotid atheroma.    Past Medical History:  Diagnosis Date  . Acoustic neuroma ([place])   . Arthritis    hands  . Cochlear implant status    bilateral  . Complication of anesthesia    difficult intubation for one of his "head surgeries at [facility]" per pt.  . History of colonic polyps   . History of craniotomy   . Hypercholesterolemia   . Macular degeneration   . Psoriasis   . Rheumatic fever age 14  . Rosacea   . Scarlet fever age 7  . Vertigo   . Wears dentures    full upper, partial lower   Past Surgical History:  Procedure Laterality Date  . APPENDECTOMY  1959  . BUNIONECTOMY    . CATARACT EXTRACTION W/PHACO Left 03/27/2019   Procedure: CATARACT EXTRACTION PHACO AND INTRAOCULAR LENS PLACEMENT ([place])  LEFT;  Surgeon: Eulogio Bear, MD;  Location: [facility];  Service: Ophthalmology;  Laterality: Left;  IVA BLOCK  . COCHLEAR IMPLANT     acute hearing loss  . CRANIECTOMY FOR EXCISION OF ACOUSTIC NEUROMA  1994  . craniotomy for meningoma     Family History  Problem Relation Age of Onset  . Hypertension Mother   . Heart disease Mother   . Heart failure Mother   . Cancer Father  66       prostate  . Stroke Father   . Heart disease Brother 46       CABG  . Stroke Son   . Colon cancer Neg Hx    Social History   Socioeconomic History  . Marital status: Divorced    Spouse name: Not on file  . Number of children: 3  . Years of education: Not on file  . Highest education level: Not on file  Occupational History  . Not on file  Tobacco Use  . Smoking status: Former Smoker    Packs/day: 1.50    Years: 40.00    Pack years: 60.00    Quit date: 10/05/2000    Years since quitting: 19.8  . Smokeless tobacco: Former Systems developer    Quit date: Biochemist, clinical  . Vaping Use: Never used  Substance and Sexual Activity  . Alcohol use: Yes    Alcohol/week: 0.0 standard drinks    Comment: occasional wine  . Drug use: No  . Sexual activity: Not on file  Other Topics Concern  . Not on file  Social History Narrative  . Not on file   Social Determinants of Health   Financial Resource Strain: Low Risk   . Difficulty of Paying Living Expenses: Not hard  at all  Food Insecurity: No Food Insecurity  . Worried About Charity fundraiser in the Last Year: Never true  . Ran Out of Food in the Last Year: Never true  Transportation Needs: No Transportation Needs  . Lack of Transportation (Medical): No  . Lack of Transportation (Non-Medical): No  Physical Activity:   . Days of Exercise per Week: Not on file  . Minutes of Exercise per Session: Not on file  Stress: No Stress Concern Present  . Feeling of Stress : Not at all  Social Connections: Moderately Integrated  . Frequency of Communication with Friends and Family: More than three times a week  . Frequency of Social Gatherings with Friends and Family: More than three times a week  . Attends Religious Services: More than 4 times per year  . Active Member of Clubs or Organizations: Yes  . Attends Archivist Meetings: More than 4 times per year  . Marital Status: Divorced    Outpatient Encounter Medications as of  08/15/2020  Medication Sig  . aspirin EC 81 MG tablet Take 81 mg by mouth daily.  Marland Kitchen docusate sodium (COLACE) 100 MG capsule Take 100 mg by mouth 2 (two) times daily as needed for mild constipation.  . fluticasone (FLONASE) 50 MCG/ACT nasal spray Place 2 sprays into both nostrils daily.  Marland Kitchen Ketotifen Fumarate (ZADITOR OP) Apply to eye daily as needed.  . Multiple Vitamins-Minerals (VISION-VITE PRESERVE PO) Take by mouth.  . timolol (TIMOPTIC) 0.5 % ophthalmic solution Place 1 drop into both eyes daily.  Marland Kitchen triamcinolone cream (KENALOG) 0.1 % Apply 1 application topically as needed (for Rosacea).   No facility-administered encounter medications on file as of 08/15/2020.    Review of Systems  Constitutional: Negative for appetite change and unexpected weight change.  HENT: Negative for congestion, sinus pressure and sore throat.   Eyes: Negative for pain and visual disturbance.  Respiratory: Negative for cough, chest tightness and shortness of breath.   Cardiovascular: Negative for chest pain, palpitations and leg swelling.  Gastrointestinal: Negative for abdominal pain, diarrhea, nausea and vomiting.  Genitourinary: Negative for difficulty urinating and dysuria.  Musculoskeletal: Negative for joint swelling and myalgias.  Skin: Negative for color change and rash.  Neurological: Negative for dizziness, light-headedness and headaches.  Hematological: Negative for adenopathy. Does not bruise/bleed easily.  Psychiatric/Behavioral: Negative for agitation and decreased concentration.       Objective:    Physical Exam Vitals reviewed.  Constitutional:      General: He is not in acute distress.    Appearance: Normal appearance. He is well-developed.  HENT:     Head: Normocephalic and atraumatic.     Right Ear: External ear normal.     Left Ear: External ear normal.  Eyes:     General: No scleral icterus.       Right eye: No discharge.        Left eye: No discharge.      Conjunctiva/sclera: Conjunctivae normal.  Neck:     Thyroid: No thyromegaly.  Cardiovascular:     Rate and Rhythm: Normal rate and regular rhythm.  Pulmonary:     Effort: No respiratory distress.     Breath sounds: Normal breath sounds. No wheezing.  Abdominal:     General: Bowel sounds are normal.     Palpations: Abdomen is soft.     Tenderness: There is no abdominal tenderness.  Musculoskeletal:        General: No swelling or tenderness.  Cervical back: Neck supple. No tenderness.  Lymphadenopathy:     Cervical: No cervical adenopathy.  Skin:    Findings: No erythema or rash.  Neurological:     Mental Status: He is alert and oriented to person, place, and time.  Psychiatric:        Mood and Affect: Mood normal.        Behavior: Behavior normal.     BP 130/70   Pulse 64   Temp 97.8 F (36.6 C) (Oral)   Resp 16   Ht 5\' 8"  (1.727 m)   Wt 184 lb (83.5 kg)   SpO2 97%   BMI 27.98 kg/m  Wt Readings from Last 3 Encounters:  08/15/20 184 lb (83.5 kg)  07/17/20 189 lb (85.7 kg)  04/12/20 189 lb (85.7 kg)     Lab Results  Component Value Date   WBC 5.2 08/15/2020   HGB 15.5 08/15/2020   HCT 46.3 08/15/2020   PLT 155.0 08/15/2020   GLUCOSE 71 08/15/2020   CHOL 201 (H) 08/15/2020   TRIG 253.0 (H) 08/15/2020   HDL 33.70 (L) 08/15/2020   LDLDIRECT 140.0 08/15/2020   LDLCALC 141 (H) 01/20/2018   ALT 25 08/15/2020   AST 21 08/15/2020   NA 140 08/15/2020   K 4.7 08/15/2020   CL 101 08/15/2020   CREATININE 0.92 08/15/2020   BUN 16 08/15/2020   CO2 32 08/15/2020   TSH 3.95 04/12/2020   PSA 0.31 04/12/2020       Assessment & Plan:   Problem List Items Addressed This Visit    Thrombocytopenia ([place])    Follow cbc.       Relevant Orders   CBC with Differential/Platelet (Completed)   Hypercholesterolemia    Intolerance to lipitor and crestor.  Low cholesterol diet and exercise.  Discussed finding of carotid atheroma.  Discussed possibly restarting  cholesterol medication.  Check lipid panel.       Relevant Orders   Hepatic function panel (Completed)   Lipid panel (Completed)   Basic metabolic panel (Completed)   Health care maintenance    Physical today 08/15/20.  Colonoscopy 07/2011 with polyps.  Recommended f/u colonoscopy in 3-5 years. Discussed again today.  He declines further colon cancer screening/testing.  04/12/20 PSA .31.       Carotid artery disease (HCC)    On recent CT, calcifications in the right neck at the level of C3/C4 are c/w calcified carotid atheromas.  Refer to [facility] for further evaluation and treatment.        Relevant Orders   Ambulatory referral to Vascular Surgery    Other Visit Diagnoses    Routine general medical examination at a health care facility    -  Primary       Einar Pheasant, MD

## 2020-08-15 NOTE — Assessment & Plan Note (Addendum)
Physical today 08/15/20.  Colonoscopy 07/2011 with polyps.  Recommended f/u colonoscopy in 3-5 years. Discussed again today.  He declines further colon cancer screening/testing.  04/12/20 PSA .31.

## 2020-08-24 ENCOUNTER — Encounter: Payer: Self-pay | Admitting: Internal Medicine

## 2020-08-24 DIAGNOSIS — I779 Disorder of arteries and arterioles, unspecified: Secondary | ICD-10-CM | POA: Insufficient documentation

## 2020-08-24 NOTE — Assessment & Plan Note (Signed)
Follow cbc.  

## 2020-08-24 NOTE — Assessment & Plan Note (Signed)
Intolerance to lipitor and crestor.  Low cholesterol diet and exercise.  Discussed finding of carotid atheroma.  Discussed possibly restarting cholesterol medication.  Check lipid panel.

## 2020-08-24 NOTE — Assessment & Plan Note (Signed)
On recent CT, calcifications in the right neck at the level of C3/C4 are c/w calcified carotid atheromas.  Refer to [facility] for further evaluation and treatment.

## 2020-09-12 ENCOUNTER — Encounter (INDEPENDENT_AMBULATORY_CARE_PROVIDER_SITE_OTHER): Payer: Self-pay | Admitting: Vascular Surgery

## 2020-09-12 ENCOUNTER — Other Ambulatory Visit: Payer: Self-pay

## 2020-09-12 ENCOUNTER — Ambulatory Visit (INDEPENDENT_AMBULATORY_CARE_PROVIDER_SITE_OTHER): Payer: Medicare Other | Admitting: Vascular Surgery

## 2020-09-12 VITALS — BP 149/75 | HR 62 | Ht 68.0 in | Wt 187.0 lb

## 2020-09-12 DIAGNOSIS — I779 Disorder of arteries and arterioles, unspecified: Secondary | ICD-10-CM

## 2020-09-12 DIAGNOSIS — E7800 Pure hypercholesterolemia, unspecified: Secondary | ICD-10-CM | POA: Diagnosis not present

## 2020-09-12 DIAGNOSIS — K219 Gastro-esophageal reflux disease without esophagitis: Secondary | ICD-10-CM

## 2020-09-12 NOTE — Progress Notes (Signed)
MRN : [id number]  Benjamin Carney is a 85 y.o. (05-Jul-1935) male who presents with chief complaint of  Chief Complaint  Patient presents with  . New Patient (Initial Visit)    [place] carotid stenosis  .  History of Present Illness: The patient is seen for evaluation of carotid stenosis. The carotid stenosis was identified after a scan of his jaw in preparation for prosthodontic surgery (scheduled for 10/16/2020).  Scan showed calcifications in the region of the carotid arteries.  Previous duplex of the carotid arteries dated 07/29/2016 showed <50% bilateral ICA stenosis.  The patient denies amaurosis fugax. There is no recent history of TIA symptoms or focal motor deficits. There is no prior documented CVA.  There is no history of migraine headaches. There is no history of seizures.  The patient is taking enteric-coated aspirin 81 mg daily.  The patient has a history of coronary artery disease, no recent episodes of angina or shortness of breath. The patient denies PAD or claudication symptoms. There is a history of hyperlipidemia which is being treated with a statin.    Current Meds  Medication Sig  . aspirin EC 81 MG tablet Take 81 mg by mouth daily.  Marland Kitchen docusate sodium (COLACE) 100 MG capsule Take 100 mg by mouth 2 (two) times daily as needed for mild constipation.  . fluticasone (FLONASE) 50 MCG/ACT nasal spray Place 2 sprays into both nostrils daily.  Marland Kitchen Ketotifen Fumarate (ZADITOR OP) Apply to eye daily as needed.  . Multiple Vitamins-Minerals (VISION-VITE PRESERVE PO) Take by mouth.  . timolol (TIMOPTIC) 0.5 % ophthalmic solution Place 1 drop into both eyes daily.  Marland Kitchen triamcinolone cream (KENALOG) 0.1 % Apply 1 application topically as needed (for Rosacea).    Past Medical History:  Diagnosis Date  . Acoustic neuroma ([place])   . Arthritis    hands  . Cochlear implant status    bilateral  . Complication of anesthesia    difficult intubation for one of his "head surgeries  at [facility]" per pt.  . History of colonic polyps   . History of craniotomy   . Hypercholesterolemia   . Macular degeneration   . Psoriasis   . Rheumatic fever age 15  . Rosacea   . Scarlet fever age 77  . Vertigo   . Wears dentures    full upper, partial lower    Past Surgical History:  Procedure Laterality Date  . APPENDECTOMY  1959  . BUNIONECTOMY    . CATARACT EXTRACTION W/PHACO Left 03/27/2019   Procedure: CATARACT EXTRACTION PHACO AND INTRAOCULAR LENS PLACEMENT ([place])  LEFT;  Surgeon: Eulogio Bear, MD;  Location: [place];  Service: Ophthalmology;  Laterality: Left;  IVA BLOCK  . COCHLEAR IMPLANT     acute hearing loss  . CRANIECTOMY FOR EXCISION OF ACOUSTIC NEUROMA  1994  . craniotomy for meningoma      Social History Social History   Tobacco Use  . Smoking status: Former Smoker    Packs/day: 1.50    Years: 40.00    Pack years: 60.00    Quit date: 10/05/2000    Years since quitting: 19.9  . Smokeless tobacco: Former Systems developer    Quit date: Biochemist, clinical  . Vaping Use: Never used  Substance Use Topics  . Alcohol use: Yes    Alcohol/week: 0.0 standard drinks    Comment: occasional wine  . Drug use: No    Family History Family History  Problem Relation Age of  Onset  . Hypertension Mother   . Heart disease Mother   . Heart failure Mother   . Cancer Father 51       prostate  . Stroke Father   . Heart disease Brother 53       CABG  . Stroke Son   . Colon cancer Neg Hx   No family history of bleeding/clotting disorders, porphyria or autoimmune disease   Allergies  Allergen Reactions  . Crestor [Rosuvastatin Calcium] Other (See Comments)    Myalgias   . Levonorgestrel-Ethinyl Estrad Other (See Comments)  . Metronidazole Rash    Flagyl might have caused rash.     REVIEW OF SYSTEMS (Negative unless checked)  Constitutional: [] Weight loss  [] Fever  [] Chills Cardiac: [] Chest pain   [] Chest pressure   [] Palpitations   [] Shortness of breath  when laying flat   [] Shortness of breath with exertion. Vascular:  [] Pain in legs with walking   [] Pain in legs at rest  [] History of DVT   [] Phlebitis   [] Swelling in legs   [] Varicose veins   [] Non-healing ulcers Pulmonary:   [] Uses home oxygen   [] Productive cough   [] Hemoptysis   [] Wheeze  [] COPD   [] Asthma Neurologic:  [] Dizziness   [] Seizures   [] History of stroke   [] History of TIA  [] Aphasia   [] Vissual changes   [] Weakness or numbness in arm   [] Weakness or numbness in leg Musculoskeletal:   [] Joint swelling   [] Joint pain   [] Low back pain Hematologic:  [] Easy bruising  [] Easy bleeding   [] Hypercoagulable state   [] Anemic Gastrointestinal:  [] Diarrhea   [] Vomiting  [] Gastroesophageal reflux/heartburn   [] Difficulty swallowing. Genitourinary:  [] Chronic kidney disease   [] Difficult urination  [] Frequent urination   [] Blood in urine Skin:  [] Rashes   [] Ulcers  Psychological:  [] History of anxiety   []  History of major depression.  Physical Examination  Vitals:   09/12/20 1140  BP: (!) 149/75  Pulse: 62  Weight: 187 lb (84.8 kg)  Height: 5\' 8"  (1.727 m)   Body mass index is 28.43 kg/m. Gen: WD/WN, NAD Head: [place]/AT, No temporalis wasting.  Ear/Nose/Throat: Hearing grossly intact, nares w/o erythema or drainage, poor dentition Eyes: PER, EOMI, sclera nonicteric.  Neck: Supple, no masses.  No bruit or JVD.  Pulmonary:  Good air movement, clear to auscultation bilaterally, no use of accessory muscles.  Cardiac: RRR, normal S1, S2, no Murmurs. Vascular: left carotid bruit no right carotid bruit Vessel Right Left  Radial Palpable Palpable  Carotid Palpable Palpable  Gastrointestinal: soft, non-distended. No guarding/no peritoneal signs.  Musculoskeletal: M/S 5/5 throughout.  No deformity or atrophy.  Neurologic: CN 2-12 intact. Pain and light touch intact in extremities.  Symmetrical.  Speech is fluent. Motor exam as listed above. Psychiatric: Judgment intact, Mood & affect  appropriate for pt's clinical situation. Dermatologic: No rashes or ulcers noted.  No changes consistent with cellulitis.  CBC Lab Results  Component Value Date   WBC 5.2 08/15/2020   HGB 15.5 08/15/2020   HCT 46.3 08/15/2020   MCV 91.1 08/15/2020   PLT 155.0 08/15/2020    BMET    Component Value Date/Time   NA 140 08/15/2020 0947   K 4.7 08/15/2020 0947   CL 101 08/15/2020 0947   CO2 32 08/15/2020 0947   GLUCOSE 71 08/15/2020 0947   BUN 16 08/15/2020 0947   CREATININE 0.92 08/15/2020 0947   CALCIUM 9.1 08/15/2020 0947   CrCl cannot be calculated (Patient's most recent lab  result is older than the maximum 21 days allowed.).  COAG No results found for: INR, PROTIME  Radiology No results found.   Assessment/Plan 1. Right-sided carotid artery disease, unspecified type ([place]) Recommend:  Given the patient's asymptomatic carotid stenosis in association of with his up coming surgery a follow up ultrasound is needed.  Duplex ultrasound will be ordered.  Continue antiplatelet therapy as prescribed Continue management of CAD, HTN and Hyperlipidemia Healthy heart diet,  encouraged exercise at least 4 times per week Follow up in 1 month with duplex ultrasound and physical exam.   - VAS US CAROTID; Future  2. Hypercholesterolemia Continue statin as ordered and reviewed, no changes at this time   3. Gastroesophageal reflux disease, unspecified whether esophagitis present Continue PPI as already ordered, this medication has been reviewed and there are no changes at this time.  Avoidence of caffeine and alcohol  Moderate elevation of the head of the bed     Hortencia Pilar, MD  09/12/2020 12:05 PM

## 2020-10-03 ENCOUNTER — Ambulatory Visit (INDEPENDENT_AMBULATORY_CARE_PROVIDER_SITE_OTHER): Payer: Medicare Other

## 2020-10-03 ENCOUNTER — Other Ambulatory Visit: Payer: Self-pay

## 2020-10-03 ENCOUNTER — Ambulatory Visit (INDEPENDENT_AMBULATORY_CARE_PROVIDER_SITE_OTHER): Payer: Medicare Other | Admitting: Nurse Practitioner

## 2020-10-03 VITALS — BP 147/80 | HR 64 | Ht 68.0 in | Wt 184.0 lb

## 2020-10-03 DIAGNOSIS — E7800 Pure hypercholesterolemia, unspecified: Secondary | ICD-10-CM | POA: Diagnosis not present

## 2020-10-03 DIAGNOSIS — I779 Disorder of arteries and arterioles, unspecified: Secondary | ICD-10-CM

## 2020-10-04 ENCOUNTER — Encounter (INDEPENDENT_AMBULATORY_CARE_PROVIDER_SITE_OTHER): Payer: Self-pay | Admitting: Nurse Practitioner

## 2020-10-04 NOTE — Progress Notes (Signed)
Subjective:    [name], male    DOB: 1934-10-24, 85 y.o.   MRN: [id number] Chief Complaint  Patient presents with  . Carotid  . Follow-up    U/S    Patient presents today for follow-up evaluation of carotid artery stenosis.  The patient underwent noninvasive studies for preparation of an upcoming orthodontic surgery he was found to have calcifications evident in his carotid arteries.  The patient has had venous ultrasounds for carotid artery stenosis that showed a less than 50% stenosis.  The patient denies any TIA-like symptoms or obvious visual changes.  He denies any fever, chills, nausea, vomiting or diarrhea.  Overall no obvious signs symptoms of carotid artery disease.  Today noninvasive study showed the right ICA has a 40 to 59% stenosis with the left ICA having a 1 to 39% stenosis.  The study notes the bilateral vertebral arteries demonstrate antegrade flow with normal flow hemodynamics in the bilateral subclavian arteries.   Review of Systems  Eyes: Negative for visual disturbance.  All other systems reviewed and are negative.      Objective:   Physical Exam Vitals reviewed.  HENT:     Head: Normocephalic.  Neck:     Vascular: Carotid bruit present.  Cardiovascular:     Rate and Rhythm: Normal rate.     Pulses: Normal pulses.  Pulmonary:     Effort: Pulmonary effort is normal.  Neurological:     Mental Status: He is alert and oriented to person, place, and time.  Psychiatric:        Mood and Affect: Mood normal.        Behavior: Behavior normal.        Thought Content: Thought content normal.        Judgment: Judgment normal.     BP (!) 147/80   Pulse 64   Ht 5\' 8"  (1.727 m)   Wt 184 lb (83.5 kg)   BMI 27.98 kg/m   Past Medical History:  Diagnosis Date  . Acoustic neuroma ([place])   . Arthritis    hands  . Cochlear implant status    bilateral  . Complication of anesthesia    difficult intubation for one of his "head surgeries at  [facility]" per pt.  . History of colonic polyps   . History of craniotomy   . Hypercholesterolemia   . Macular degeneration   . Psoriasis   . Rheumatic fever age 75  . Rosacea   . Scarlet fever age 73  . Vertigo   . Wears dentures    full upper, partial lower    Social History   Socioeconomic History  . Marital status: Divorced    Spouse name: Not on file  . Number of children: 3  . Years of education: Not on file  . Highest education level: Not on file  Occupational History  . Not on file  Tobacco Use  . Smoking status: Former Smoker    Packs/day: 1.50    Years: 40.00    Pack years: 60.00    Quit date: 10/05/2000    Years since quitting: 20.0  . Smokeless tobacco: Former Systems developer    Quit date: Biochemist, clinical  . Vaping Use: Never used  Substance and Sexual Activity  . Alcohol use: Yes    Alcohol/week: 0.0 standard drinks    Comment: occasional wine  . Drug use: No  . Sexual activity: Not on file  Other Topics Concern  .  Not on file  Social History Narrative  . Not on file   Social Determinants of Health   Financial Resource Strain: Low Risk   . Difficulty of Paying Living Expenses: Not hard at all  Food Insecurity: No Food Insecurity  . Worried About Programme researcher, broadcasting/film/video in the Last Year: Never true  . Ran Out of Food in the Last Year: Never true  Transportation Needs: No Transportation Needs  . Lack of Transportation (Medical): No  . Lack of Transportation (Non-Medical): No  Physical Activity: Not on file  Stress: No Stress Concern Present  . Feeling of Stress : Not at all  Social Connections: Moderately Integrated  . Frequency of Communication with Friends and Family: More than three times a week  . Frequency of Social Gatherings with Friends and Family: More than three times a week  . Attends Religious Services: More than 4 times per year  . Active Member of Clubs or Organizations: Yes  . Attends Banker Meetings: More than 4 times per year  .  Marital Status: Divorced  Catering manager Violence: Not At Risk  . Fear of Current or Ex-Partner: No  . Emotionally Abused: No  . Physically Abused: No  . Sexually Abused: No    Past Surgical History:  Procedure Laterality Date  . APPENDECTOMY  1959  . BUNIONECTOMY    . CATARACT EXTRACTION W/PHACO Left 03/27/2019   Procedure: CATARACT EXTRACTION PHACO AND INTRAOCULAR LENS PLACEMENT ([facility])  LEFT;  Surgeon: Nevada Crane, MD;  Location[facility];  Service: Ophthalmology;  Laterality: Left;  IVA BLOCK  . COCHLEAR IMPLANT     acute hearing loss  . CRANIECTOMY FOR EXCISION OF ACOUSTIC NEUROMA  1994  . craniotomy for meningoma      Family History  Problem Relation Age of Onset  . Hypertension Mother   . Heart disease Mother   . Heart failure Mother   . Cancer Father 33       prostate  . Stroke Father   . Heart disease Brother 21       CABG  . Stroke Son   . Colon cancer Neg Hx     Allergies  Allergen Reactions  . Crestor [Rosuvastatin Calcium] Other (See Comments)    Myalgias   . Levonorgestrel-Ethinyl Estrad Other (See Comments)  . Metronidazole Rash    Flagyl might have caused rash.    CBC Latest Ref Rng & Units 08/15/2020 04/12/2020 05/02/2019  WBC 4.0 - 10.5 K/uL 5.2 4.7 -  Hemoglobin 13.0 - 17.0 g/dL 34.7 42.5 -  Hematocrit 39.0 - 52.0 % 46.3 46.3 -  Platelets 150.0 - 400.0 K/uL 155.0 169.0 162      CMP     Component Value Date/Time   NA 140 08/15/2020 0947   K 4.7 08/15/2020 0947   CL 101 08/15/2020 0947   CO2 32 08/15/2020 0947   GLUCOSE 71 08/15/2020 0947   BUN 16 08/15/2020 0947   CREATININE 0.92 08/15/2020 0947   CALCIUM 9.1 08/15/2020 0947   PROT 6.6 08/15/2020 0947   ALBUMIN 4.4 08/15/2020 0947   AST 21 08/15/2020 0947   ALT 25 08/15/2020 0947   ALKPHOS 94 08/15/2020 0947   BILITOT 1.1 08/15/2020 0947     No results found.     Assessment & Plan:   1. Right-sided carotid artery disease, unspecified type  ([facility]) Recommend:  Given the patient's asymptomatic subcritical stenosis no further invasive testing or surgery at this time.  Today noninvasive study showed the right ICA has a 40 to 59% stenosis with the left ICA having a 1 to 39% stenosis.  Continue antiplatelet therapy as prescribed Continue management of CAD, HTN and Hyperlipidemia Healthy heart diet,  encouraged exercise at least 4 times per week Follow up in 12 months with duplex ultrasound and physical exam   2. Hypercholesterolemia Continue statin as ordered and reviewed, no changes at this time    Current Outpatient Medications on File Prior to Visit  Medication Sig Dispense Refill  . aspirin EC 81 MG tablet Take 81 mg by mouth daily.    Marland Kitchen docusate sodium (COLACE) 100 MG capsule Take 100 mg by mouth 2 (two) times daily as needed for mild constipation.    . fluticasone (FLONASE) 50 MCG/ACT nasal spray Place 2 sprays into both nostrils daily. 48 g 1  . Ketotifen Fumarate (ZADITOR OP) Apply to eye daily as needed.    . Multiple Vitamins-Minerals (VISION-VITE PRESERVE PO) Take by mouth.    . timolol (TIMOPTIC) 0.5 % ophthalmic solution Place 1 drop into both eyes daily.    Marland Kitchen triamcinolone cream (KENALOG) 0.1 % Apply 1 application topically as needed (for Rosacea).     No current facility-administered medications on file prior to visit.    There are no Patient Instructions on file for this visit. No follow-ups on file.   Kris Hartmann, NP

## 2020-11-18 ENCOUNTER — Encounter: Payer: Self-pay | Admitting: Internal Medicine

## 2020-11-18 DIAGNOSIS — M25539 Pain in unspecified wrist: Secondary | ICD-10-CM

## 2020-11-19 NOTE — Telephone Encounter (Signed)
Order placed for ortho referral.   

## 2020-11-19 NOTE — Telephone Encounter (Signed)
We can refer.  Does he want to stay in town.  I can refer him to [facility] or [facility] or if [place] - Dr Gardenia Phlegm

## 2020-11-25 ENCOUNTER — Telehealth: Payer: Self-pay | Admitting: Internal Medicine

## 2020-11-25 NOTE — Telephone Encounter (Signed)
Rejection Reason - Patient did not respond - We have contacted this patient 2 times, left 2 messages and mailed a letter. This patient has not contacted [place] back to schedule. Referral is being closed due to time sensitivity but pt has our info to call and schedule. We will still be happy to assist your office and the patient. Thank you!" Virl Cagey said on Nov 25, 2020 2:36 PM  "Thank you for the referral. We have attempted to reach this patient but were not successful in reaching them and were not able to leave a vm. We will send them a letter just letting them know we are trying to reach them. Thank you" Virl Cagey said on Nov 19, 2020 10:51 AM  [facility]

## 2020-12-20 ENCOUNTER — Other Ambulatory Visit: Payer: Self-pay | Admitting: Internal Medicine

## 2020-12-26 ENCOUNTER — Encounter: Payer: Self-pay | Admitting: Internal Medicine

## 2020-12-27 NOTE — Telephone Encounter (Signed)
LMTCB

## 2020-12-27 NOTE — Telephone Encounter (Signed)
Please call and triage pt.  When symptoms started, if sob, etc - would need to be evaluated.  If qualifies or is interested in amb outpt treatment, then would need to be seen for referral.  This would depend on when symptoms started etc.

## 2020-12-30 NOTE — Telephone Encounter (Signed)
Patient was returning call about covid

## 2020-12-30 NOTE — Telephone Encounter (Signed)
Called patient. Confirmed doing ok. He has mild cough and a little nasal congestion. No other symptoms. Feeling ok. He will call if he needs anything.

## 2021-02-13 ENCOUNTER — Ambulatory Visit (INDEPENDENT_AMBULATORY_CARE_PROVIDER_SITE_OTHER): Payer: Medicare Other | Admitting: Internal Medicine

## 2021-02-13 ENCOUNTER — Other Ambulatory Visit: Payer: Self-pay

## 2021-02-13 ENCOUNTER — Encounter: Payer: Self-pay | Admitting: Internal Medicine

## 2021-02-13 VITALS — BP 114/72 | HR 62 | Temp 97.1°F | Resp 16 | Ht 68.0 in | Wt 174.6 lb

## 2021-02-13 DIAGNOSIS — Z86018 Personal history of other benign neoplasm: Secondary | ICD-10-CM

## 2021-02-13 DIAGNOSIS — E7800 Pure hypercholesterolemia, unspecified: Secondary | ICD-10-CM

## 2021-02-13 DIAGNOSIS — H9390 Unspecified disorder of ear, unspecified ear: Secondary | ICD-10-CM

## 2021-02-13 DIAGNOSIS — Z86010 Personal history of colonic polyps: Secondary | ICD-10-CM

## 2021-02-13 DIAGNOSIS — I779 Disorder of arteries and arterioles, unspecified: Secondary | ICD-10-CM | POA: Diagnosis not present

## 2021-02-13 DIAGNOSIS — R634 Abnormal weight loss: Secondary | ICD-10-CM | POA: Diagnosis not present

## 2021-02-13 DIAGNOSIS — D696 Thrombocytopenia, unspecified: Secondary | ICD-10-CM | POA: Diagnosis not present

## 2021-02-13 NOTE — Progress Notes (Signed)
[name], male   DOB: 1935/08/02, 86 y.o.   MRN: [id number]   Subjective:    [name], male    DOB: Feb 10, 1935, 86 y.o.   MRN: [id number]  HPI This visit occurred during the SARS-CoV-2 public health emergency.  Safety protocols were in place, including screening questions prior to the visit, additional usage of staff PPE, and extensive cleaning of exam room while observing appropriate contact time as indicated for disinfecting solutions.  Patient here for a scheduled follow up.  Here to follow up regarding his cholesterol.  Had covid 12/2020.  No significant residual problems since having covid.  Has lost weight.  Reports getting full faster.  Decreased portions and watching his diet. Exercising more. No nausea or vomiting. No abdominal pain.  Bowels moving.  Tendon - left hand - pain.  Using [employer].  Better now. Played golf recently.  Persistent ear lesion.  Request dermatology referral.    Past Medical History:  Diagnosis Date  . Acoustic neuroma ([place])   . Arthritis    hands  . Cochlear implant status    bilateral  . Complication of anesthesia    difficult intubation for one of his "head surgeries at [facility]" per pt.  . History of colonic polyps   . History of craniotomy   . Hypercholesterolemia   . Macular degeneration   . Psoriasis   . Rheumatic fever age 74  . Rosacea   . Scarlet fever age 55  . Vertigo   . Wears dentures    full upper, partial lower   Past Surgical History:  Procedure Laterality Date  . APPENDECTOMY  1959  . BUNIONECTOMY    . CATARACT EXTRACTION W/PHACO Left 03/27/2019   Procedure: CATARACT EXTRACTION PHACO AND INTRAOCULAR LENS PLACEMENT ([place])  LEFT;  Surgeon: Eulogio Bear, MD;  Location: [facility];  Service: Ophthalmology;  Laterality: Left;  IVA BLOCK  . COCHLEAR IMPLANT     acute hearing loss  . CRANIECTOMY FOR EXCISION OF ACOUSTIC NEUROMA  1994  . craniotomy for meningoma     Family History  Problem  Relation Age of Onset  . Hypertension Mother   . Heart disease Mother   . Heart failure Mother   . Cancer Father 32       prostate  . Stroke Father   . Heart disease Brother 58       CABG  . Stroke Son   . Colon cancer Neg Hx    Social History   Socioeconomic History  . Marital status: Divorced    Spouse name: Not on file  . Number of children: 3  . Years of education: Not on file  . Highest education level: Not on file  Occupational History  . Not on file  Tobacco Use  . Smoking status: Former Smoker    Packs/day: 1.50    Years: 40.00    Pack years: 60.00    Quit date: 10/05/2000    Years since quitting: 20.3  . Smokeless tobacco: Former Systems developer    Quit date: Biochemist, clinical  . Vaping Use: Never used  Substance and Sexual Activity  . Alcohol use: Yes    Alcohol/week: 0.0 standard drinks    Comment: occasional wine  . Drug use: No  . Sexual activity: Not on file  Other Topics Concern  . Not on file  Social History Narrative  . Not on file   Social Determinants of Health  Financial Resource Strain: Low Risk   . Difficulty of Paying Living Expenses: Not hard at all  Food Insecurity: No Food Insecurity  . Worried About Charity fundraiser in the Last Year: Never true  . Ran Out of Food in the Last Year: Never true  Transportation Needs: No Transportation Needs  . Lack of Transportation (Medical): No  . Lack of Transportation (Non-Medical): No  Physical Activity: Not on file  Stress: No Stress Concern Present  . Feeling of Stress : Not at all  Social Connections: Moderately Integrated  . Frequency of Communication with Friends and Family: More than three times a week  . Frequency of Social Gatherings with Friends and Family: More than three times a week  . Attends Religious Services: More than 4 times per year  . Active Member of Clubs or Organizations: Yes  . Attends Archivist Meetings: More than 4 times per year  . Marital Status: Divorced     Outpatient Encounter Medications as of 02/13/2021  Medication Sig  . aspirin EC 81 MG tablet Take 81 mg by mouth daily.  Marland Kitchen docusate sodium (COLACE) 100 MG capsule Take 100 mg by mouth 2 (two) times daily as needed for mild constipation.  . fluticasone (FLONASE) 50 MCG/ACT nasal spray SPRAY TWO SPRAYS IN EACH NOSTRIL ONCE DAILY  . Multiple Vitamins-Minerals (VISION-VITE PRESERVE PO) Take by mouth.  . timolol (TIMOPTIC) 0.5 % ophthalmic solution Place 1 drop into both eyes daily.  Marland Kitchen triamcinolone cream (KENALOG) 0.1 % Apply 1 application topically as needed (for Rosacea).  . [DISCONTINUED] Ketotifen Fumarate (ZADITOR OP) Apply to eye daily as needed. (Patient not taking: Reported on 02/13/2021)   No facility-administered encounter medications on file as of 02/13/2021.    Review of Systems  Constitutional: Negative for appetite change and unexpected weight change.  HENT: Negative for congestion and sinus pressure.   Respiratory: Negative for cough, chest tightness and shortness of breath.   Cardiovascular: Negative for chest pain, palpitations and leg swelling.  Gastrointestinal: Negative for abdominal pain, diarrhea, nausea and vomiting.  Genitourinary: Negative for difficulty urinating and dysuria.  Musculoskeletal: Negative for joint swelling and myalgias.  Skin: Negative for color change and rash.  Neurological: Negative for dizziness, light-headedness and headaches.  Psychiatric/Behavioral: Negative for agitation and dysphoric mood.       Objective:    Physical Exam Vitals reviewed.  Constitutional:      General: He is not in acute distress.    Appearance: Normal appearance. He is well-developed.  HENT:     Head: Normocephalic and atraumatic.     Right Ear: External ear normal.     Left Ear: External ear normal.  Eyes:     General: No scleral icterus.       Right eye: No discharge.        Left eye: No discharge.     Conjunctiva/sclera: Conjunctivae normal.   Cardiovascular:     Rate and Rhythm: Normal rate and regular rhythm.  Pulmonary:     Effort: Pulmonary effort is normal. No respiratory distress.     Breath sounds: Normal breath sounds.  Abdominal:     General: Bowel sounds are normal.     Palpations: Abdomen is soft.     Tenderness: There is no abdominal tenderness.  Musculoskeletal:        General: No swelling or tenderness.     Cervical back: Neck supple. No tenderness.  Lymphadenopathy:     Cervical: No cervical adenopathy.  Skin:    Findings: No erythema or rash.  Neurological:     Mental Status: He is alert.  Psychiatric:        Mood and Affect: Mood normal.        Behavior: Behavior normal.     BP 114/72   Pulse 62   Temp (!) 97.1 F (36.2 C) (Temporal)   Resp 16   Ht 5\' 8"  (1.727 m)   Wt 174 lb 9.6 oz (79.2 kg)   SpO2 99%   BMI 26.55 kg/m  Wt Readings from Last 3 Encounters:  02/13/21 174 lb 9.6 oz (79.2 kg)  10/03/20 184 lb (83.5 kg)  09/12/20 187 lb (84.8 kg)     Lab Results  Component Value Date   WBC 5.2 08/15/2020   HGB 15.5 08/15/2020   HCT 46.3 08/15/2020   PLT 155.0 08/15/2020   GLUCOSE 71 08/15/2020   CHOL 201 (H) 08/15/2020   TRIG 253.0 (H) 08/15/2020   HDL 33.70 (L) 08/15/2020   LDLDIRECT 140.0 08/15/2020   LDLCALC 141 (H) 01/20/2018   ALT 25 08/15/2020   AST 21 08/15/2020   NA 140 08/15/2020   K 4.7 08/15/2020   CL 101 08/15/2020   CREATININE 0.92 08/15/2020   BUN 16 08/15/2020   CO2 32 08/15/2020   TSH 3.95 04/12/2020   PSA 0.31 04/12/2020       Assessment & Plan:   Problem List Items Addressed This Visit    Carotid artery disease ([place])    Vascular surgery 10/04/20 - recommended f/u in 12 months. (70-62% RICA and 3-76% LICA).  Declines cholesterol medication.        Ear lesion    Skin lesion - ear.  Request dermatology referral.       Relevant Orders   Ambulatory referral to Dermatology   History of colonic polyps    Colonoscopy 2012 with polyp removed.  GI had  recommended f/u in 3-5 years.  Have discussed f/u colonoscopy with him.  He has declined and continues to decline.  Notify me if changes mind.        History of meningioma    S/p removal.  Was followed by neurosurgery.  No f/u warranted.  Doing well.       Hypercholesterolemia - Primary    Intolerant to crestor and lipitor.  Low cholesterol diet and exercise.  Follow lipid panel.       Relevant Orders   Basic Metabolic Panel (BMET)   CBC with Differential/Platelet   Hepatic function panel   Lipid panel   TSH   Thrombocytopenia (HCC)    Follow cbc.       Relevant Orders   Basic Metabolic Panel (BMET)   CBC with Differential/Platelet   Hepatic function panel   Lipid panel   TSH   Weight loss    Weight loss as outlined.  He feels is related to more exercise.  Watching diet.  Follow.  Schedule f/u soon to reassess.       Relevant Orders   Basic Metabolic Panel (BMET)   CBC with Differential/Platelet   Hepatic function panel   Lipid panel   TSH       Einar Pheasant, MD

## 2021-02-15 ENCOUNTER — Encounter: Payer: Self-pay | Admitting: Internal Medicine

## 2021-02-15 DIAGNOSIS — H9390 Unspecified disorder of ear, unspecified ear: Secondary | ICD-10-CM | POA: Insufficient documentation

## 2021-02-15 NOTE — Assessment & Plan Note (Signed)
Intolerant to crestor and lipitor.  Low cholesterol diet and exercise.  Follow lipid panel.  

## 2021-02-15 NOTE — Assessment & Plan Note (Signed)
Skin lesion - ear.  Request dermatology referral.

## 2021-02-15 NOTE — Assessment & Plan Note (Signed)
Colonoscopy 2012 with polyp removed.  GI had recommended f/u in 3-5 years.  Have discussed f/u colonoscopy with him.  He has declined and continues to decline.  Notify me if changes mind.   

## 2021-02-15 NOTE — Assessment & Plan Note (Signed)
S/p removal.  Was followed by neurosurgery.  No f/u warranted.  Doing well.  

## 2021-02-15 NOTE — Assessment & Plan Note (Signed)
Weight loss as outlined.  He feels is related to more exercise.  Watching diet.  Follow.  Schedule f/u soon to reassess.

## 2021-02-15 NOTE — Assessment & Plan Note (Signed)
Vascular surgery 10/04/20 - recommended f/u in 12 months. (123456 RICA and 123456 LICA).  Declines cholesterol medication.

## 2021-02-15 NOTE — Assessment & Plan Note (Signed)
Follow cbc.  

## 2021-04-16 ENCOUNTER — Ambulatory Visit (INDEPENDENT_AMBULATORY_CARE_PROVIDER_SITE_OTHER): Payer: Medicare Other | Admitting: Internal Medicine

## 2021-04-16 ENCOUNTER — Encounter: Payer: Self-pay | Admitting: Internal Medicine

## 2021-04-16 ENCOUNTER — Other Ambulatory Visit: Payer: Self-pay

## 2021-04-16 VITALS — BP 126/58 | HR 59 | Temp 96.4°F | Ht 67.99 in | Wt 176.2 lb

## 2021-04-16 DIAGNOSIS — I779 Disorder of arteries and arterioles, unspecified: Secondary | ICD-10-CM

## 2021-04-16 DIAGNOSIS — H9390 Unspecified disorder of ear, unspecified ear: Secondary | ICD-10-CM

## 2021-04-16 DIAGNOSIS — R634 Abnormal weight loss: Secondary | ICD-10-CM | POA: Diagnosis not present

## 2021-04-16 DIAGNOSIS — Z125 Encounter for screening for malignant neoplasm of prostate: Secondary | ICD-10-CM | POA: Diagnosis not present

## 2021-04-16 DIAGNOSIS — Z86018 Personal history of other benign neoplasm: Secondary | ICD-10-CM

## 2021-04-16 DIAGNOSIS — E7800 Pure hypercholesterolemia, unspecified: Secondary | ICD-10-CM | POA: Diagnosis not present

## 2021-04-16 DIAGNOSIS — D696 Thrombocytopenia, unspecified: Secondary | ICD-10-CM

## 2021-04-16 DIAGNOSIS — Z86010 Personal history of colonic polyps: Secondary | ICD-10-CM

## 2021-04-16 LAB — LIPID PANEL
Cholesterol: 207 mg/dL — ABNORMAL HIGH (ref 0–200)
HDL: 37 mg/dL — ABNORMAL LOW (ref >39.00)
NonHDL: 169.71
Total CHOL/HDL Ratio: 6
Triglycerides: 243 mg/dL — ABNORMAL HIGH (ref 0.0–149.0)
VLDL: 48.6 mg/dL — ABNORMAL HIGH (ref 0.0–40.0)

## 2021-04-16 LAB — CBC WITH DIFFERENTIAL/PLATELET
Basophils Absolute: 0 10*3/uL (ref 0.0–0.1)
Basophils Relative: 0.6 % (ref 0.0–3.0)
Eosinophils Absolute: 0.2 10*3/uL (ref 0.0–0.7)
Eosinophils Relative: 3.8 % (ref 0.0–5.0)
HCT: 46.3 % (ref 39.0–52.0)
Hemoglobin: 15.4 g/dL (ref 13.0–17.0)
Lymphocytes Relative: 25.8 % (ref 12.0–46.0)
Lymphs Abs: 1.1 10*3/uL (ref 0.7–4.0)
MCHC: 33.3 g/dL (ref 30.0–36.0)
MCV: 92.6 fl (ref 78.0–100.0)
Monocytes Absolute: 0.4 10*3/uL (ref 0.1–1.0)
Monocytes Relative: 8.1 % (ref 3.0–12.0)
Neutro Abs: 2.7 10*3/uL (ref 1.4–7.7)
Neutrophils Relative %: 61.7 % (ref 43.0–77.0)
Platelets: 147 10*3/uL — ABNORMAL LOW (ref 150.0–400.0)
RBC: 5 Mil/uL (ref 4.22–5.81)
RDW: 15.1 % (ref 11.5–15.5)
WBC: 4.4 10*3/uL (ref 4.0–10.5)

## 2021-04-16 LAB — BASIC METABOLIC PANEL
BUN: 18 mg/dL (ref 6–23)
CO2: 31 mEq/L (ref 19–32)
Calcium: 9.5 mg/dL (ref 8.4–10.5)
Chloride: 103 mEq/L (ref 96–112)
Creatinine, Ser: 0.95 mg/dL (ref 0.40–1.50)
GFR: 73.9 mL/min (ref >60.00)
Glucose, Bld: 85 mg/dL (ref 70–99)
Potassium: 5.3 mEq/L — ABNORMAL HIGH (ref 3.5–5.1)
Sodium: 140 mEq/L (ref 135–145)

## 2021-04-16 LAB — LDL CHOLESTEROL, DIRECT: Direct LDL: 142 mg/dL

## 2021-04-16 LAB — HEPATIC FUNCTION PANEL
ALT: 22 U/L (ref 0–53)
AST: 20 U/L (ref 0–37)
Albumin: 4.4 g/dL (ref 3.5–5.2)
Alkaline Phosphatase: 84 U/L (ref 39–117)
Bilirubin, Direct: 0.2 mg/dL (ref 0.0–0.3)
Total Bilirubin: 1.1 mg/dL (ref 0.2–1.2)
Total Protein: 6.4 g/dL (ref 6.0–8.3)

## 2021-04-16 LAB — TSH: TSH: 3.01 u[IU]/mL (ref 0.35–5.50)

## 2021-04-16 LAB — PSA, MEDICARE: PSA: 0.43 ng/ml (ref 0.10–4.00)

## 2021-04-16 NOTE — Progress Notes (Signed)
[name], male   DOB: 12-11-1934, 86 y.o.   MRN: [id number]   Subjective:    [name], male    DOB: 09-26-1935, 86 y.o.   MRN: [id number]  HPI This visit occurred during the SARS-CoV-2 public health emergency.  Safety protocols were in place, including screening questions prior to the visit, additional usage of staff PPE, and extensive cleaning of exam room while observing appropriate contact time as indicated for disinfecting solutions.   Patient here for a scheduled follow up. Here to follow up regarding previous weight loss.  Weight up a couple of pounds from last check.  He feels his weight loss has been due to his teeth issues.  He just got his dentures.  He is getting adjusted to them.  Hopes he can eat better with these.  Denies any nausea or vomiting.  No abdominal pain or cramping.  Bowels moving.  Stays active.  No chest pain or tightness with increased activity or exertion.  Overall he feels he is doing well.  Persistent ear lesion.  Needs referral to dermatology.   Past Medical History:  Diagnosis Date   Acoustic neuroma [facility])    Arthritis    hands   Cochlear implant status    bilateral   Complication of anesthesia    difficult intubation for one of his "head surgeries at [facility]" per pt.   History of colonic polyps    History of craniotomy    Hypercholesterolemia    Macular degeneration    Psoriasis    Rheumatic fever age 63   Rosacea    Scarlet fever age 55   Vertigo    Wears dentures    full upper, partial lower   Past Surgical History:  Procedure Laterality Date   APPENDECTOMY  1959   BUNIONECTOMY     CATARACT EXTRACTION W/PHACO Left 03/27/2019   Procedure: CATARACT EXTRACTION PHACO AND INTRAOCULAR LENS PLACEMENT ([place])  LEFT;  Surgeon: Eulogio Bear, MD;  Location: [place];  Service: Ophthalmology;  Laterality: Left;  IVA BLOCK   COCHLEAR IMPLANT     acute hearing loss   CRANIECTOMY FOR EXCISION OF ACOUSTIC NEUROMA   1994   craniotomy for meningoma     Family History  Problem Relation Age of Onset   Hypertension Mother    Heart disease Mother    Heart failure Mother    Cancer Father 28       prostate   Stroke Father    Heart disease Brother 67       CABG   Stroke Son    Colon cancer Neg Hx    Social History   Socioeconomic History   Marital status: Divorced    Spouse name: Not on file   Number of children: 3   Years of education: Not on file   Highest education level: Not on file  Occupational History   Not on file  Tobacco Use   Smoking status: Former    Packs/day: 1.50    Years: 40.00    Pack years: 60.00    Types: Cigarettes    Quit date: 10/05/2000    Years since quitting: 20.5   Smokeless tobacco: Former    Quit date: 1990  Vaping Use   Vaping Use: Never used  Substance and Sexual Activity   Alcohol use: Yes    Alcohol/week: 0.0 standard drinks    Comment: occasional wine   Drug use: No   Sexual activity:  Not on file  Other Topics Concern   Not on file  Social History Narrative   Not on file   Social Determinants of Health   Financial Resource Strain: Low Risk    Difficulty of Paying Living Expenses: Not hard at all  Food Insecurity: No Food Insecurity   Worried About [place] in the Last Year: Never true   [place] in the Last Year: Never true  Transportation Needs: No Transportation Needs   Lack of Transportation (Medical): No   Lack of Transportation (Non-Medical): No  Physical Activity: Not on file  Stress: No Stress Concern Present   Feeling of Stress : Not at all  Social Connections: Moderately Integrated   Frequency of Communication with Friends and Family: More than three times a week   Frequency of Social Gatherings with Friends and Family: More than three times a week   Attends Religious Services: More than 4 times per year   Active Member of [employer] or Organizations: Yes   Attends Music therapist: More than 4 times per  year   Marital Status: Divorced    Review of Systems  Constitutional:  Negative for appetite change and unexpected weight change.  HENT:  Negative for congestion and sinus pressure.   Respiratory:  Negative for cough, chest tightness and shortness of breath.   Cardiovascular:  Negative for chest pain, palpitations and leg swelling.  Gastrointestinal:  Negative for abdominal pain, diarrhea, nausea and vomiting.  Genitourinary:  Negative for difficulty urinating and dysuria.  Musculoskeletal:  Negative for joint swelling and myalgias.  Skin:  Negative for color change and rash.       Outer ear - lesion.   Neurological:  Negative for dizziness, light-headedness and headaches.  Psychiatric/Behavioral:  Negative for agitation and dysphoric mood.       Objective:    Physical Exam Constitutional:      General: He is not in acute distress.    Appearance: Normal appearance. He is well-developed.  HENT:     Head: Normocephalic and atraumatic.     Right Ear: External ear normal.     Left Ear: External ear normal.  Eyes:     General: No scleral icterus.       Right eye: No discharge.        Left eye: No discharge.  Cardiovascular:     Rate and Rhythm: Normal rate and regular rhythm.  Pulmonary:     Effort: Pulmonary effort is normal. No respiratory distress.     Breath sounds: Normal breath sounds.  Abdominal:     General: Bowel sounds are normal.     Palpations: Abdomen is soft.     Tenderness: There is no abdominal tenderness.  Musculoskeletal:        General: No swelling or tenderness.     Cervical back: Neck supple. No tenderness.  Lymphadenopathy:     Cervical: No cervical adenopathy.  Skin:    Findings: No erythema or rash.  Neurological:     Mental Status: He is alert.  Psychiatric:        Mood and Affect: Mood normal.        Behavior: Behavior normal.    BP (!) 126/58 (BP Location: Left Arm, Patient Position: Sitting)   Pulse (!) 59   Temp (!) 96.4 F (35.8 C)    Ht 5' 7.99" (1.727 m)   Wt 176 lb 3.2 oz (79.9 kg)   SpO2 96%   BMI  26.80 kg/m  Wt Readings from Last 3 Encounters:  04/16/21 176 lb 3.2 oz (79.9 kg)  02/13/21 174 lb 9.6 oz (79.2 kg)  10/03/20 184 lb (83.5 kg)    Outpatient Encounter Medications as of 04/16/2021  Medication Sig   aspirin EC 81 MG tablet Take 81 mg by mouth daily.   docusate sodium (COLACE) 100 MG capsule Take 100 mg by mouth 2 (two) times daily as needed for mild constipation.   fluticasone (FLONASE) 50 MCG/ACT nasal spray SPRAY TWO SPRAYS IN EACH NOSTRIL ONCE DAILY   Multiple Vitamins-Minerals (VISION-VITE PRESERVE PO) Take by mouth.   timolol (TIMOPTIC) 0.5 % ophthalmic solution Place 1 drop into both eyes daily.   triamcinolone cream (KENALOG) 0.1 % Apply 1 application topically as needed (for Rosacea).   No facility-administered encounter medications on file as of 04/16/2021.     Lab Results  Component Value Date   WBC 4.4 04/16/2021   HGB 15.4 04/16/2021   HCT 46.3 04/16/2021   PLT 147.0 (L) 04/16/2021   GLUCOSE 85 04/16/2021   CHOL 207 (H) 04/16/2021   TRIG 243.0 (H) 04/16/2021   HDL 37.00 (L) 04/16/2021   LDLDIRECT 142.0 04/16/2021   LDLCALC 141 (H) 01/20/2018   ALT 22 04/16/2021   AST 20 04/16/2021   NA 140 04/16/2021   K 4.2 04/17/2021   CL 103 04/16/2021   CREATININE 0.95 04/16/2021   BUN 18 04/16/2021   CO2 31 04/16/2021   TSH 3.01 04/16/2021   PSA 0.43 04/16/2021       Assessment & Plan:   Problem List Items Addressed This Visit     Carotid artery disease ([place])    Vascular surgery 10/04/20 - recommended f/u in 12 months. (49-67% RICA and 5-91% LICA).  Declines cholesterol medication.         Ear lesion    Skin lesion - ear.  Request dermatology referral.        History of colonic polyps    Colonoscopy 2012 with polyp removed.  GI had recommended f/u in 3-5 years.  Have discussed f/u colonoscopy with him.  He has declined and continues to decline.  Notify me if changes mind.          History of meningioma    S/p removal.  Was followed by neurosurgery.  No f/u warranted.  Doing well.        Hypercholesterolemia    Intolerant to crestor and lipitor.  Low cholesterol diet and exercise.  Follow lipid panel.        Thrombocytopenia (HCC)    Check CBC today to confirm if platelet count within normal limits.       Weight loss    Weight up a couple of pounds from last check.  He feels related to the issues he has been having with his teeth.  He just received his dentures.  He feels that this is going to help as far as his eating.  Denies any nausea or vomiting.  No abdominal pain.  No bowel change.  Follow weight.       Other Visit Diagnoses     Prostate cancer screening    -  Primary   Relevant Orders   PSA, Medicare (Completed)        Einar Pheasant, MD

## 2021-04-17 ENCOUNTER — Other Ambulatory Visit (INDEPENDENT_AMBULATORY_CARE_PROVIDER_SITE_OTHER): Payer: Medicare Other

## 2021-04-17 ENCOUNTER — Other Ambulatory Visit: Payer: Self-pay | Admitting: Internal Medicine

## 2021-04-17 ENCOUNTER — Other Ambulatory Visit: Payer: Self-pay | Admitting: *Deleted

## 2021-04-17 DIAGNOSIS — E875 Hyperkalemia: Secondary | ICD-10-CM | POA: Diagnosis not present

## 2021-04-17 LAB — POTASSIUM: Potassium: 4.2 mEq/L (ref 3.5–5.1)

## 2021-04-17 NOTE — Progress Notes (Signed)
Order placed for f/u potassium check.  

## 2021-04-28 ENCOUNTER — Telehealth: Payer: Self-pay | Admitting: Internal Medicine

## 2021-04-28 ENCOUNTER — Encounter: Payer: Self-pay | Admitting: Internal Medicine

## 2021-04-28 NOTE — Assessment & Plan Note (Signed)
Check CBC today to confirm if platelet count within normal limits.

## 2021-04-28 NOTE — Assessment & Plan Note (Signed)
Vascular surgery 10/04/20 - recommended f/u in 12 months. (123456 RICA and 123456 LICA).  Declines cholesterol medication.

## 2021-04-28 NOTE — Telephone Encounter (Signed)
Had previously placed dermatology referral for persistent ear lesion (skin lesion on outer ear).  Benjamin Carney reports he has not heard about an appt.  Can you help with this?  Let me know if I need to do something more.   Thank you.  Dr Nicki Reaper

## 2021-04-28 NOTE — Assessment & Plan Note (Signed)
Weight up a couple of pounds from last check.  He feels related to the issues he has been having with his teeth.  He just received his dentures.  He feels that this is going to help as far as his eating.  Denies any nausea or vomiting.  No abdominal pain.  No bowel change.  Follow weight.

## 2021-04-28 NOTE — Assessment & Plan Note (Signed)
S/p removal.  Was followed by neurosurgery.  No f/u warranted.  Doing well.  

## 2021-04-28 NOTE — Assessment & Plan Note (Signed)
Intolerant to crestor and lipitor.  Low cholesterol diet and exercise.  Follow lipid panel.  

## 2021-04-28 NOTE — Assessment & Plan Note (Signed)
Skin lesion - ear.  Request dermatology referral.

## 2021-04-28 NOTE — Assessment & Plan Note (Signed)
Colonoscopy 2012 with polyp removed.  GI had recommended f/u in 3-5 years.  Have discussed f/u colonoscopy with him.  He has declined and continues to decline.  Notify me if changes mind.   

## 2021-05-09 ENCOUNTER — Telehealth: Payer: Self-pay | Admitting: Internal Medicine

## 2021-05-09 NOTE — Telephone Encounter (Signed)
Lft vm at [place] [facility] to call ofc to follow up on referral.thanks

## 2021-05-09 NOTE — Telephone Encounter (Signed)
I left another vm at [place] [facility] to confirm appt and pt number. thanks

## 2021-07-18 ENCOUNTER — Ambulatory Visit (INDEPENDENT_AMBULATORY_CARE_PROVIDER_SITE_OTHER): Payer: Medicare Other

## 2021-07-18 VITALS — Ht 67.99 in | Wt 176.0 lb

## 2021-07-18 DIAGNOSIS — Z0000 Encounter for general adult medical examination without abnormal findings: Secondary | ICD-10-CM

## 2021-07-18 NOTE — Progress Notes (Signed)
Subjective:   Benjamin Carney is a 86 y.o. male who presents for Medicare Annual/Subsequent preventive examination.  Review of Systems    No ROS.  Medicare Wellness Virtual Visit.  Visual/audio telehealth visit, UTA vital signs.   See social history for additional risk factors.   Cardiac Risk Factors include: advanced age (>28men, >30 women);male gender     Objective:    Today's Vitals   07/18/21 0828  Weight: 176 lb (79.8 kg)  Height: 5' 7.99" (1.727 m)   Body mass index is 26.77 kg/m.  Advanced Directives 07/18/2021 07/17/2019 03/27/2019 07/11/2018 07/08/2017  Does Patient Have a Medical Advance Directive? Yes Yes Yes Yes Yes  Type of Paramedic of [place];Living will [place];Living will [place];Living will [place];Living will [place];Living will  Does patient want to make changes to medical advance directive? No - Patient declined No - Patient declined No - Patient declined No - Patient declined No - Patient declined  Copy of [place] in Chart? No - copy requested No - copy requested Yes - validated most recent copy scanned in chart (See row information) No - copy requested No - copy requested    Current Medications (verified) Outpatient Encounter Medications as of 07/18/2021  Medication Sig   aspirin EC 81 MG tablet Take 81 mg by mouth daily.   docusate sodium (COLACE) 100 MG capsule Take 100 mg by mouth 2 (two) times daily as needed for mild constipation.   fluticasone (FLONASE) 50 MCG/ACT nasal spray SPRAY TWO SPRAYS IN EACH NOSTRIL ONCE DAILY   Multiple Vitamins-Minerals (VISION-VITE PRESERVE PO) Take by mouth.   timolol (TIMOPTIC) 0.5 % ophthalmic solution Place 1 drop into both eyes daily.   triamcinolone cream (KENALOG) 0.1 % Apply 1 application topically as needed (for Rosacea).   No facility-administered encounter medications on file as of  07/18/2021.    Allergies (verified) Crestor [rosuvastatin calcium], Levonorgestrel-ethinyl estrad, and Metronidazole   History: Past Medical History:  Diagnosis Date   Acoustic neuroma ([place])    Arthritis    hands   Cochlear implant status    bilateral   Complication of anesthesia    difficult intubation for one of his "head surgeries at [facility]" per pt.   History of colonic polyps    History of craniotomy    Hypercholesterolemia    Macular degeneration    Psoriasis    Rheumatic fever age 12   Rosacea    Scarlet fever age 6   Vertigo    Wears dentures    full upper, partial lower   Past Surgical History:  Procedure Laterality Date   APPENDECTOMY  1959   BUNIONECTOMY     CATARACT EXTRACTION W/PHACO Left 03/27/2019   Procedure: CATARACT EXTRACTION PHACO AND INTRAOCULAR LENS PLACEMENT ([place])  LEFT;  Surgeon: Eulogio Bear, MD;  Location: [facility];  Service: Ophthalmology;  Laterality: Left;  IVA BLOCK   COCHLEAR IMPLANT     acute hearing loss   CRANIECTOMY FOR EXCISION OF ACOUSTIC NEUROMA  1994   craniotomy for meningoma     Family History  Problem Relation Age of Onset   Hypertension Mother    Heart disease Mother    Heart failure Mother    Cancer Father 36       prostate   Stroke Father    Heart disease Brother 56       CABG   Stroke Son  Colon cancer Neg Hx    Social History   Socioeconomic History   Marital status: Divorced    Spouse name: Not on file   Number of children: 3   Years of education: Not on file   Highest education level: Not on file  Occupational History   Not on file  Tobacco Use   Smoking status: Former    Packs/day: 1.50    Years: 40.00    Pack years: 60.00    Types: Cigarettes    Quit date: 10/05/2000    Years since quitting: 20.7   Smokeless tobacco: Former    Quit date: 1990  Scientific laboratory technician Use: Never used  Substance and Sexual Activity   Alcohol use: Yes    Alcohol/week: 0.0 standard drinks    Comment:  occasional wine   Drug use: No   Sexual activity: Not on file  Other Topics Concern   Not on file  Social History Narrative   Not on file   Social Determinants of Health   Financial Resource Strain: Low Risk    Difficulty of Paying Living Expenses: Not hard at all  Food Insecurity: No Food Insecurity   Worried About Charity fundraiser in the Last Year: Never true   [place] in the Last Year: Never true  Transportation Needs: No Transportation Needs   Lack of Transportation (Medical): No   Lack of Transportation (Non-Medical): No  Physical Activity: Sufficiently Active   Days of Exercise per Week: 7 days   Minutes of Exercise per Session: 60 min  Stress: No Stress Concern Present   Feeling of Stress : Not at all  Social Connections: Moderately Integrated   Frequency of Communication with Friends and Family: More than three times a week   Frequency of Social Gatherings with Friends and Family: More than three times a week   Attends Religious Services: More than 4 times per year   Active Member of [employer] or Organizations: Yes   Attends Music therapist: More than 4 times per year   Marital Status: Divorced    Tobacco Counseling Counseling given: Not Answered   Clinical Intake:  Pre-visit preparation completed: Yes        Diabetes: No  How often do you need to have someone help you when you read instructions, pamphlets, or other written materials from your doctor or pharmacy?: 1 - Never    Interpreter Needed?: No      Activities of Daily Living In your present state of health, do you have any difficulty performing the following activities: 07/18/2021  Hearing? Y  Vision? N  Difficulty concentrating or making decisions? N  Walking or climbing stairs? N  Comment Paces self  Dressing or bathing? N  Doing errands, shopping? N  Preparing Food and eating ? N  Using the Toilet? N  In the past six months, have you accidently leaked urine? N   Do you have problems with loss of bowel control? N  Managing your Medications? N  Managing your Finances? N  Housekeeping or managing your Housekeeping? N  Some recent data might be hidden    Patient Care Team: Einar Pheasant, MD as PCP - General (Internal Medicine)  Indicate any recent Medical Services you may have received from other than [facility] providers in the past year (date may be approximate).     Assessment:   This is a routine wellness examination for Kier.  I connected with Olis today by  telephone and verified that I am speaking with the correct person using two identifiers. Location patient: home Location provider: work Persons participating in the virtual visit: patient, Marine scientist.    I discussed the limitations, risks, security and privacy concerns of performing an evaluation and management service by telephone and the availability of in person appointments. The patient expressed understanding and verbally consented to this telephonic visit.    Interactive audio and video telecommunications were attempted between this provider and patient, however failed, due to patient having technical difficulties OR patient did not have access to video capability.  We continued and completed visit with audio only.  Some vital signs may be absent or patient reported.   Hearing/Vision screen Hearing Screening - Comments:: Followed by [facility] Visits every 12 months Cochlear implant Vision Screening - Comments:: Followed by [facility]  Wears corrective lenses  Visits every 6 months.  Cataract extraction, bilateral  They have regular follow up with the ophthalmologist  Dietary issues and exercise activities discussed: Current Exercise Habits: Home exercise routine, Type of exercise: walking, Time (Minutes): 60, Frequency (Times/Week): 7, Weekly Exercise (Minutes/Week): 420, Intensity: Mild Healthy diet Good water intake    Goals Addressed               This Visit's  Progress     Patient Stated     Maintain Healthy Lifestyle (pt-stated)        Stay active Stay hydrated Healthy diet       Depression Screen PHQ 2/9 Scores 07/18/2021 04/16/2021 07/17/2020 07/17/2019 07/11/2018 07/08/2017 07/14/2016  PHQ - 2 Score 0 0 0 0 0 0 0    Fall Risk Fall Risk  07/18/2021 04/16/2021 07/17/2020 07/17/2019 07/11/2018  Falls in the past year? 0 1 0 0 No  Number falls in past yr: - 1 0 - -  Injury with Fall? - 0 - - -  Follow up Falls evaluation completed Falls evaluation completed Falls evaluation completed - -    FALL RISK PREVENTION PERTAINING TO THE HOME: Home free of loose throw rugs in walkways, pet beds, electrical cords, etc? Yes  Adequate lighting in your home to reduce risk of falls? Yes   ASSISTIVE DEVICES UTILIZED TO PREVENT FALLS: Deaf alarm/fire alarm alert in use? Yes  Use of a cane, walker or w/c? No  Grab bars in the bathroom? Yes  Shower chair or bench in shower? Yes  Elevated toilet seat? Yes   TIMED UP AND GO: Was the test performed? No .   Cognitive Function: Patient is alert and oriented x3.  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  MMSE/6CIT deferred. Normal by direct communication/observation.    MMSE - Mini Mental State Exam 07/11/2018  Orientation to time 5  Orientation to Place 5  Registration 3  Attention/ Calculation 5  Recall 3  Language- name 2 objects 2  Language- repeat 1  Language- follow 3 step command 3  Language- read & follow direction 1  Write a sentence 1  Copy design 1  Total score 30     6CIT Screen 07/17/2019 07/08/2017  What Year? 0 points 0 points  What month? 0 points 0 points  What time? 0 points 0 points  Count back from 20 0 points 0 points  Months in reverse 0 points 0 points  Repeat phrase 0 points 0 points  Total Score 0 0    Immunizations Immunization History  Administered Date(s) Administered   Fluad Quad(high Dose 65+) 06/16/2019   Influenza  Split 09/11/2011, 08/15/2012    Influenza, High Dose Seasonal PF 07/21/2017, 07/14/2018   Influenza,inj,Quad PF,6+ Mos 08/25/2013, 10/10/2014, 09/03/2015, 07/29/2016   PFIZER(Purple Top)SARS-COV-2 Vaccination 10/12/2019, 11/04/2019   Pneumococcal Conjugate-13 12/07/2013   Pneumococcal Polysaccharide-23 06/05/2009   Zoster, Live 09/22/2010   Shingrix Completed?: No.    Education has been provided regarding the importance of this vaccine. Patient has been advised to call insurance company to determine out of pocket expense if they have not yet received this vaccine. Advised may also receive vaccine at local pharmacy or Health Dept. Verbalized acceptance and understanding.  Health Maintenance Health Maintenance  Topic Date Due   Zoster Vaccines- Shingrix (1 of 2) 10/18/2021 (Originally 07/21/1987)   TETANUS/TDAP  04/16/2022 (Originally 07/20/1954)   COVID-19 Vaccine (3 - Booster for [place] series) 05/02/2022 (Originally 04/03/2020)   HPV VACCINES  Aged Out   INFLUENZA VACCINE  Discontinued   Colorectal cancer screening: No longer required.    Vision Screening: Recommended annual ophthalmology exams for early detection of glaucoma and other disorders of the eye.  Dental Screening: Recommended annual dental exams for proper oral hygiene  Community Resource Referral / Chronic Care Management: CRR required this visit?  No   CCM required this visit?  No      Plan:     I have personally reviewed and noted the following in the patient's chart:   Medical and social history Use of alcohol, tobacco or illicit drugs  Current medications and supplements including opioid prescriptions. Patient is not currently taking opioid prescriptions. Functional ability and status Nutritional status Physical activity Advanced directives List of other physicians Hospitalizations, surgeries, and ER visits in previous 12 months Vitals Screenings to include cognitive, depression, and falls Referrals and appointments  In addition,  I have reviewed and discussed with patient certain preventive protocols, quality metrics, and best practice recommendations. A written personalized care plan for preventive services as well as general preventive health recommendations were provided to patient.     Varney Biles, LPN   [id number]

## 2021-07-18 NOTE — Patient Instructions (Addendum)
Benjamin Carney , Thank you for taking time to come for your Medicare Wellness Visit. I appreciate your ongoing commitment to your health goals. Please review the following plan we discussed and let me know if I can assist you in the future.   These are the goals we discussed:  Goals       Patient Stated     Maintain Healthy Lifestyle (pt-stated)      Stay active Stay hydrated Healthy diet        This is a list of the screening recommended for you and due dates:  Health Maintenance  Topic Date Due   Zoster (Shingles) Vaccine (1 of 2) 10/18/2021*   Tetanus Vaccine  04/16/2022*   COVID-19 Vaccine (3 - Booster for Pfizer series) 05/02/2022*   HPV Vaccine  Aged Out   Flu Shot  Discontinued  *Topic was postponed. The date shown is not the original due date.    Advanced directives: End of life planning; Advance aging; Advanced directives discussed.  Copy of current HCPOA/Living Will requested.    Conditions/risks identified: none new  Follow up in one year for your annual wellness visit.   Preventive Care 84 Years and Older, Male Preventive care refers to lifestyle choices and visits with your health care provider that can promote health and wellness. What does preventive care include? A yearly physical exam. This is also called an annual well check. Dental exams once or twice a year. Routine eye exams. Ask your health care provider how often you should have your eyes checked. Personal lifestyle choices, including: Daily care of your teeth and gums. Regular physical activity. Eating a healthy diet. Avoiding tobacco and drug use. Limiting alcohol use. Practicing safe sex. Taking low doses of aspirin every day. Taking vitamin and mineral supplements as recommended by your health care provider. What happens during an annual well check? The services and screenings done by your health care provider during your annual well check will depend on your age, overall health, lifestyle risk  factors, and family history of disease. Counseling  Your health care provider may ask you questions about your: Alcohol use. Tobacco use. Drug use. Emotional well-being. Home and relationship well-being. Sexual activity. Eating habits. History of falls. Memory and ability to understand (cognition). Work and work Statistician. Screening  You may have the following tests or measurements: Height, weight, and BMI. Blood pressure. Lipid and cholesterol levels. These may be checked every 5 years, or more frequently if you are over 21 years old. Skin check. Lung cancer screening. You may have this screening every year starting at age 84 if you have a 30-pack-year history of smoking and currently smoke or have quit within the past 15 years. Fecal occult blood test (FOBT) of the stool. You may have this test every year starting at age 84. Flexible sigmoidoscopy or colonoscopy. You may have a sigmoidoscopy every 5 years or a colonoscopy every 10 years starting at age 84. Prostate cancer screening. Recommendations will vary depending on your family history and other risks. Hepatitis C blood test. Hepatitis B blood test. Sexually transmitted disease (STD) testing. Diabetes screening. This is done by checking your blood sugar (glucose) after you have not eaten for a while (fasting). You may have this done every 1-3 years. Abdominal aortic aneurysm (AAA) screening. You may need this if you are a current or former smoker. Osteoporosis. You may be screened starting at age 84 if you are at high risk. Talk with your health care provider about  your test results, treatment options, and if necessary, the need for more tests. Vaccines  Your health care provider may recommend certain vaccines, such as: Influenza vaccine. This is recommended every year. Tetanus, diphtheria, and acellular pertussis (Tdap, Td) vaccine. You may need a Td booster every 10 years. Zoster vaccine. You may need this after age  84. Pneumococcal 13-valent conjugate (PCV13) vaccine. One dose is recommended after age 84. Pneumococcal polysaccharide (PPSV23) vaccine. One dose is recommended after age 84. Talk to your health care provider about which screenings and vaccines you need and how often you need them. This information is not intended to replace advice given to you by your health care provider. Make sure you discuss any questions you have with your health care provider. Document Released: 10/18/2015 Document Revised: 06/10/2016 Document Reviewed: 07/23/2015 Elsevier Interactive Patient Education  2017 [place] Prevention in the Home Falls can cause injuries. They can happen to people of all ages. There are many things you can do to make your home safe and to help prevent falls. What can I do on the outside of my home? Regularly fix the edges of walkways and driveways and fix any cracks. Remove anything that might make you trip as you walk through a door, such as a raised step or threshold. Trim any bushes or trees on the path to your home. Use bright outdoor lighting. Clear any walking paths of anything that might make someone trip, such as rocks or tools. Regularly check to see if handrails are loose or broken. Make sure that both sides of any steps have handrails. Any raised decks and porches should have guardrails on the edges. Have any leaves, snow, or ice cleared regularly. Use sand or salt on walking paths during winter. Clean up any spills in your garage right away. This includes oil or grease spills. What can I do in the bathroom? Use night lights. Install grab bars by the toilet and in the tub and shower. Do not use towel bars as grab bars. Use non-skid mats or decals in the tub or shower. If you need to sit down in the shower, use a plastic, non-slip stool. Keep the floor dry. Clean up any water that spills on the floor as soon as it happens. Remove soap buildup in the tub or shower  regularly. Attach bath mats securely with double-sided non-slip rug tape. Do not have throw rugs and other things on the floor that can make you trip. What can I do in the bedroom? Use night lights. Make sure that you have a light by your bed that is easy to reach. Do not use any sheets or blankets that are too big for your bed. They should not hang down onto the floor. Have a firm chair that has side arms. You can use this for support while you get dressed. Do not have throw rugs and other things on the floor that can make you trip. What can I do in the kitchen? Clean up any spills right away. Avoid walking on wet floors. Keep items that you use a lot in easy-to-reach places. If you need to reach something above you, use a strong step stool that has a grab bar. Keep electrical cords out of the way. Do not use floor polish or wax that makes floors slippery. If you must use wax, use non-skid floor wax. Do not have throw rugs and other things on the floor that can make you trip. What can I do with  my stairs? Do not leave any items on the stairs. Make sure that there are handrails on both sides of the stairs and use them. Fix handrails that are broken or loose. Make sure that handrails are as long as the stairways. Check any carpeting to make sure that it is firmly attached to the stairs. Fix any carpet that is loose or worn. Avoid having throw rugs at the top or bottom of the stairs. If you do have throw rugs, attach them to the floor with carpet tape. Make sure that you have a light switch at the top of the stairs and the bottom of the stairs. If you do not have them, ask someone to add them for you. What else can I do to help prevent falls? Wear shoes that: Do not have high heels. Have rubber bottoms. Are comfortable and fit you well. Are closed at the toe. Do not wear sandals. If you use a stepladder: Make sure that it is fully opened. Do not climb a closed stepladder. Make sure that  both sides of the stepladder are locked into place. Ask someone to hold it for you, if possible. Clearly mark and make sure that you can see: Any grab bars or handrails. First and last steps. Where the edge of each step is. Use tools that help you move around (mobility aids) if they are needed. These include: Canes. Walkers. Scooters. Crutches. Turn on the lights when you go into a dark area. Replace any light bulbs as soon as they burn out. Set up your furniture so you have a clear path. Avoid moving your furniture around. If any of your floors are uneven, fix them. If there are any pets around you, be aware of where they are. Review your medicines with your doctor. Some medicines can make you feel dizzy. This can increase your chance of falling. Ask your doctor what other things that you can do to help prevent falls. This information is not intended to replace advice given to you by your health care provider. Make sure you discuss any questions you have with your health care provider. Document Released: 07/18/2009 Document Revised: 02/27/2016 Document Reviewed: 10/26/2014 Elsevier Interactive Patient Education  2017 [employer].

## 2021-10-02 ENCOUNTER — Other Ambulatory Visit (INDEPENDENT_AMBULATORY_CARE_PROVIDER_SITE_OTHER): Payer: Self-pay | Admitting: Vascular Surgery

## 2021-10-02 ENCOUNTER — Ambulatory Visit (INDEPENDENT_AMBULATORY_CARE_PROVIDER_SITE_OTHER): Payer: Medicare Other

## 2021-10-02 ENCOUNTER — Ambulatory Visit (INDEPENDENT_AMBULATORY_CARE_PROVIDER_SITE_OTHER): Payer: Medicare Other | Admitting: Vascular Surgery

## 2021-10-02 DIAGNOSIS — I6523 Occlusion and stenosis of bilateral carotid arteries: Secondary | ICD-10-CM

## 2021-10-09 ENCOUNTER — Encounter (INDEPENDENT_AMBULATORY_CARE_PROVIDER_SITE_OTHER): Payer: Self-pay | Admitting: *Deleted

## 2021-10-17 ENCOUNTER — Ambulatory Visit (INDEPENDENT_AMBULATORY_CARE_PROVIDER_SITE_OTHER): Payer: Medicare Other | Admitting: Internal Medicine

## 2021-10-17 ENCOUNTER — Encounter: Payer: Self-pay | Admitting: Internal Medicine

## 2021-10-17 ENCOUNTER — Other Ambulatory Visit: Payer: Self-pay

## 2021-10-17 VITALS — BP 120/70 | HR 66 | Temp 97.9°F | Resp 16 | Ht 68.0 in | Wt 177.8 lb

## 2021-10-17 DIAGNOSIS — D696 Thrombocytopenia, unspecified: Secondary | ICD-10-CM | POA: Diagnosis not present

## 2021-10-17 DIAGNOSIS — Z86010 Personal history of colonic polyps: Secondary | ICD-10-CM

## 2021-10-17 DIAGNOSIS — Z86018 Personal history of other benign neoplasm: Secondary | ICD-10-CM

## 2021-10-17 DIAGNOSIS — I779 Disorder of arteries and arterioles, unspecified: Secondary | ICD-10-CM

## 2021-10-17 DIAGNOSIS — E7800 Pure hypercholesterolemia, unspecified: Secondary | ICD-10-CM

## 2021-10-17 DIAGNOSIS — Z0000 Encounter for general adult medical examination without abnormal findings: Secondary | ICD-10-CM | POA: Diagnosis not present

## 2021-10-17 LAB — BASIC METABOLIC PANEL
BUN: 18 mg/dL (ref 6–23)
CO2: 26 mEq/L (ref 19–32)
Calcium: 9.3 mg/dL (ref 8.4–10.5)
Chloride: 104 mEq/L (ref 96–112)
Creatinine, Ser: 0.96 mg/dL (ref 0.40–1.50)
GFR: 72.72 mL/min (ref >60.00)
Glucose, Bld: 91 mg/dL (ref 70–99)
Potassium: 4.2 mEq/L (ref 3.5–5.1)
Sodium: 140 mEq/L (ref 135–145)

## 2021-10-17 LAB — CBC WITH DIFFERENTIAL/PLATELET
Basophils Absolute: 0 10*3/uL (ref 0.0–0.1)
Basophils Relative: 0.8 % (ref 0.0–3.0)
Eosinophils Absolute: 0.1 10*3/uL (ref 0.0–0.7)
Eosinophils Relative: 2.8 % (ref 0.0–5.0)
HCT: 48.1 % (ref 39.0–52.0)
Hemoglobin: 15.6 g/dL (ref 13.0–17.0)
Lymphocytes Relative: 28.2 % (ref 12.0–46.0)
Lymphs Abs: 1.3 10*3/uL (ref 0.7–4.0)
MCHC: 32.5 g/dL (ref 30.0–36.0)
MCV: 92.6 fl (ref 78.0–100.0)
Monocytes Absolute: 0.3 10*3/uL (ref 0.1–1.0)
Monocytes Relative: 7.6 % (ref 3.0–12.0)
Neutro Abs: 2.7 10*3/uL (ref 1.4–7.7)
Neutrophils Relative %: 60.6 % (ref 43.0–77.0)
Platelets: 158 10*3/uL (ref 150.0–400.0)
RBC: 5.2 Mil/uL (ref 4.22–5.81)
RDW: 14.3 % (ref 11.5–15.5)
WBC: 4.5 10*3/uL (ref 4.0–10.5)

## 2021-10-17 LAB — HEPATIC FUNCTION PANEL
ALT: 19 U/L (ref 0–53)
AST: 20 U/L (ref 0–37)
Albumin: 4.2 g/dL (ref 3.5–5.2)
Alkaline Phosphatase: 83 U/L (ref 39–117)
Bilirubin, Direct: 0.1 mg/dL (ref 0.0–0.3)
Total Bilirubin: 1.1 mg/dL (ref 0.2–1.2)
Total Protein: 6.4 g/dL (ref 6.0–8.3)

## 2021-10-17 LAB — LIPID PANEL
Cholesterol: 221 mg/dL — ABNORMAL HIGH (ref 0–200)
HDL: 34 mg/dL — ABNORMAL LOW (ref >39.00)
NonHDL: 186.53
Total CHOL/HDL Ratio: 6
Triglycerides: 289 mg/dL — ABNORMAL HIGH (ref 0.0–149.0)
VLDL: 57.8 mg/dL — ABNORMAL HIGH (ref 0.0–40.0)

## 2021-10-17 LAB — LDL CHOLESTEROL, DIRECT: Direct LDL: 154 mg/dL

## 2021-10-17 NOTE — Assessment & Plan Note (Signed)
Colonoscopy 2012 with polyp removed.  GI had recommended f/u in 3-5 years.  Have discussed f/u colonoscopy with him.  He has declined and continues to decline.  Notify me if changes mind.   

## 2021-10-17 NOTE — Assessment & Plan Note (Addendum)
Physical today 10/17/21.  Colonoscopy 2012 - with polyps.  Recommended f/u in 3-5 years.  Discussed again today.  Declines.  PSA.  (04/16/21 - .43)

## 2021-10-17 NOTE — Assessment & Plan Note (Signed)
S/p removal.  Was followed by neurosurgery.  No f/u warranted.  Doing well.  

## 2021-10-17 NOTE — Assessment & Plan Note (Signed)
Platelet count 147 last check.  Recheck cbc today.

## 2021-10-17 NOTE — Progress Notes (Signed)
[name], male   DOB: 04/20/1935, 87 y.o.   MRN: [id number]   Subjective:    [name], male    DOB: [birth year], 87 y.o.   MRN: [id number]  This visit occurred during the SARS-CoV-2 public health emergency.  Safety protocols were in place, including screening questions prior to the visit, additional usage of staff PPE, and extensive cleaning of exam room while observing appropriate contact time as indicated for disinfecting solutions.   Patient here for his physical exam.   Chief Complaint  Patient presents with   Annual Exam   .   HPI Here for his physical exam.  Reports he is doing well.  Feels good.  Stays active. No chest pain or sob reported.  No abdominal pain or bowel change reported.  Weight is stable.  Eating.  Dentures.     Past Medical History:  Diagnosis Date   Acoustic neuroma [facility])    Arthritis    hands   Cochlear implant status    bilateral   Complication of anesthesia    difficult intubation for one of his "head surgeries at [facility]" per pt.   History of colonic polyps    History of craniotomy    Hypercholesterolemia    Macular degeneration    Psoriasis    Rheumatic fever age 69   Rosacea    Scarlet fever age 49   Vertigo    Wears dentures    full upper, partial lower   Past Surgical History:  Procedure Laterality Date   APPENDECTOMY  1959   BUNIONECTOMY     CATARACT EXTRACTION W/PHACO Left 03/27/2019   Procedure: CATARACT EXTRACTION PHACO AND INTRAOCULAR LENS PLACEMENT ([place])  LEFT;  Surgeon: Eulogio Bear, MD;  Location: [place];  Service: Ophthalmology;  Laterality: Left;  IVA BLOCK   COCHLEAR IMPLANT     acute hearing loss   CRANIECTOMY FOR EXCISION OF ACOUSTIC NEUROMA  1994   craniotomy for meningoma     Family History  Problem Relation Age of Onset   Hypertension Mother    Heart disease Mother    Heart failure Mother    Cancer Father 48       prostate   Stroke Father    Heart disease Brother  20       CABG   Stroke Son    Colon cancer Neg Hx    Social History   Socioeconomic History   Marital status: Divorced    Spouse name: Not on file   Number of children: 3   Years of education: Not on file   Highest education level: Not on file  Occupational History   Not on file  Tobacco Use   Smoking status: Former    Packs/day: 1.50    Years: 40.00    Pack years: 60.00    Types: Cigarettes    Quit date: 10/05/2000    Years since quitting: 21.0   Smokeless tobacco: Former    Quit date: 1990  Scientific laboratory technician Use: Never used  Substance and Sexual Activity   Alcohol use: Yes    Alcohol/week: 0.0 standard drinks    Comment: occasional wine   Drug use: No   Sexual activity: Not on file  Other Topics Concern   Not on file  Social History Narrative   Not on file   Social Determinants of Health   Financial Resource Strain: Low Risk    Difficulty of Paying Living  Expenses: Not hard at all  Food Insecurity: No Food Insecurity   Worried About Charity fundraiser in the Last Year: Never true   Ran Out of Food in the Last Year: Never true  Transportation Needs: No Transportation Needs   Lack of Transportation (Medical): No   Lack of Transportation (Non-Medical): No  Physical Activity: Sufficiently Active   Days of Exercise per Week: 7 days   Minutes of Exercise per Session: 60 min  Stress: No Stress Concern Present   Feeling of Stress : Not at all  Social Connections: Moderately Integrated   Frequency of Communication with Friends and Family: More than three times a week   Frequency of Social Gatherings with Friends and Family: More than three times a week   Attends Religious Services: More than 4 times per year   Active Member of [employer] or Organizations: Yes   Attends Music therapist: More than 4 times per year   Marital Status: Divorced     Review of Systems  Constitutional:  Negative for appetite change and unexpected weight change.  HENT:   Negative for congestion, sinus pressure and sore throat.   Eyes:  Negative for pain and visual disturbance.  Respiratory:  Negative for cough, chest tightness and shortness of breath.   Cardiovascular:  Negative for chest pain, palpitations and leg swelling.  Gastrointestinal:  Negative for abdominal pain, diarrhea, nausea and vomiting.  Genitourinary:  Negative for difficulty urinating and dysuria.  Musculoskeletal:  Negative for joint swelling and myalgias.  Skin:  Negative for color change and rash.  Neurological:  Negative for dizziness, light-headedness and headaches.  Hematological:  Negative for adenopathy. Does not bruise/bleed easily.  Psychiatric/Behavioral:  Negative for agitation and dysphoric mood.       Objective:     BP 120/70    Pulse 66    Temp 97.9 F (36.6 C)    Resp 16    Ht 5\' 8"  (1.727 m)    Wt 177 lb 12.8 oz (80.6 kg)    SpO2 99%    BMI 27.03 kg/m  Wt Readings from Last 3 Encounters:  10/17/21 177 lb 12.8 oz (80.6 kg)  07/18/21 176 lb (79.8 kg)  04/16/21 176 lb 3.2 oz (79.9 kg)    Physical Exam Constitutional:      General: He is not in acute distress.    Appearance: Normal appearance. He is well-developed.  HENT:     Head: Normocephalic and atraumatic.     Right Ear: External ear normal.     Left Ear: External ear normal.  Eyes:     General: No scleral icterus.       Right eye: No discharge.        Left eye: No discharge.     Conjunctiva/sclera: Conjunctivae normal.  Neck:     Thyroid: No thyromegaly.  Cardiovascular:     Rate and Rhythm: Normal rate and regular rhythm.  Pulmonary:     Effort: No respiratory distress.     Breath sounds: Normal breath sounds. No wheezing.  Abdominal:     General: Bowel sounds are normal.     Palpations: Abdomen is soft.     Tenderness: There is no abdominal tenderness.  Musculoskeletal:        General: No swelling or tenderness.     Cervical back: Neck supple. No tenderness.  Lymphadenopathy:     Cervical:  No cervical adenopathy.  Skin:    Findings: No erythema or rash.  Neurological:     Mental Status: He is alert and oriented to person, place, and time.  Psychiatric:        Mood and Affect: Mood normal.        Behavior: Behavior normal.     Outpatient Encounter Medications as of 10/17/2021  Medication Sig   aspirin EC 81 MG tablet Take 81 mg by mouth daily.   fluticasone (FLONASE) 50 MCG/ACT nasal spray SPRAY TWO SPRAYS IN EACH NOSTRIL ONCE DAILY   Multiple Vitamins-Minerals (VISION-VITE PRESERVE PO) Take by mouth.   timolol (TIMOPTIC) 0.5 % ophthalmic solution Place 1 drop into both eyes daily.   [DISCONTINUED] docusate sodium (COLACE) 100 MG capsule Take 100 mg by mouth 2 (two) times daily as needed for mild constipation.   [DISCONTINUED] triamcinolone cream (KENALOG) 0.1 % Apply 1 application topically as needed (for Rosacea).   No facility-administered encounter medications on file as of 10/17/2021.     Lab Results  Component Value Date   WBC 4.4 04/16/2021   HGB 15.4 04/16/2021   HCT 46.3 04/16/2021   PLT 147.0 (L) 04/16/2021   GLUCOSE 85 04/16/2021   CHOL 207 (H) 04/16/2021   TRIG 243.0 (H) 04/16/2021   HDL 37.00 (L) 04/16/2021   LDLDIRECT 142.0 04/16/2021   LDLCALC 141 (H) 01/20/2018   ALT 22 04/16/2021   AST 20 04/16/2021   NA 140 04/16/2021   K 4.2 04/17/2021   CL 103 04/16/2021   CREATININE 0.95 04/16/2021   BUN 18 04/16/2021   CO2 31 04/16/2021   TSH 3.01 04/16/2021   PSA 0.43 04/16/2021       Assessment & Plan:   Problem List Items Addressed This Visit     Carotid artery disease ([place])    Vascular surgery 10/04/20 - recommended f/u in 12 months. (09-62% RICA and 8-36% LICA).  Declines cholesterol medication.  States was just reevaluated 09/2021 - no change.        Health care maintenance    Physical today 10/17/21.  Colonoscopy 2012 - with polyps.  Recommended f/u in 3-5 years.  Discussed again today.  Declines.  PSA.  (04/16/21 - .43)      History  of colonic polyps    Colonoscopy 2012 with polyp removed.  GI had recommended f/u in 3-5 years.  Have discussed f/u colonoscopy with him.  He has declined and continues to decline.  Notify me if changes mind.        History of meningioma    S/p removal.  Was followed by neurosurgery.  No f/u warranted.  Doing well.       Hypercholesterolemia    Intolerant to crestor and lipitor.  Low cholesterol diet and exercise.  Follow lipid panel.       Relevant Orders   Lipid panel   Hepatic function panel   Basic metabolic panel   Thrombocytopenia (H[facility])    Platelet count 147 last check.  Recheck cbc today.       Relevant Orders   CBC with Differential/Platelet   Other Visit Diagnoses     Routine general medical examination at a health care facility    -  Primary        Einar Pheasant, MD

## 2021-10-17 NOTE — Assessment & Plan Note (Signed)
Vascular surgery 10/04/20 - recommended f/u in 12 months. (22-57% RICA and 5-05% LICA).  Declines cholesterol medication.  States was just reevaluated 09/2021 - no change.

## 2021-10-17 NOTE — Assessment & Plan Note (Signed)
Intolerant to crestor and lipitor.  Low cholesterol diet and exercise.  Follow lipid panel.  

## 2021-10-20 ENCOUNTER — Telehealth: Payer: Self-pay

## 2021-10-20 NOTE — Telephone Encounter (Signed)
Pt returned call regarding lab

## 2021-10-20 NOTE — Telephone Encounter (Signed)
LMTCB in regards to lab results.  

## 2021-10-22 NOTE — Telephone Encounter (Signed)
LMTCB

## 2021-12-22 ENCOUNTER — Other Ambulatory Visit: Payer: Self-pay | Admitting: Internal Medicine

## 2022-03-25 ENCOUNTER — Encounter: Payer: Self-pay | Admitting: Internal Medicine

## 2022-04-09 ENCOUNTER — Encounter: Payer: Self-pay | Admitting: Internal Medicine

## 2022-04-09 ENCOUNTER — Ambulatory Visit: Payer: Medicare Other | Admitting: Internal Medicine

## 2022-04-09 VITALS — BP 126/80 | HR 58 | Temp 98.1°F | Resp 16 | Ht 68.0 in | Wt 180.2 lb

## 2022-04-09 DIAGNOSIS — R413 Other amnesia: Secondary | ICD-10-CM | POA: Diagnosis not present

## 2022-04-09 DIAGNOSIS — D696 Thrombocytopenia, unspecified: Secondary | ICD-10-CM

## 2022-04-09 DIAGNOSIS — E7800 Pure hypercholesterolemia, unspecified: Secondary | ICD-10-CM

## 2022-04-09 DIAGNOSIS — M25552 Pain in left hip: Secondary | ICD-10-CM

## 2022-04-09 DIAGNOSIS — I779 Disorder of arteries and arterioles, unspecified: Secondary | ICD-10-CM

## 2022-04-09 DIAGNOSIS — E875 Hyperkalemia: Secondary | ICD-10-CM | POA: Diagnosis not present

## 2022-04-09 LAB — CBC WITH DIFFERENTIAL/PLATELET
Basophils Absolute: 0 10*3/uL (ref 0.0–0.1)
Basophils Relative: 0.6 % (ref 0.0–3.0)
Eosinophils Absolute: 0.2 10*3/uL (ref 0.0–0.7)
Eosinophils Relative: 4.3 % (ref 0.0–5.0)
HCT: 45 % (ref 39.0–52.0)
Hemoglobin: 14.7 g/dL (ref 13.0–17.0)
Lymphocytes Relative: 27.2 % (ref 12.0–46.0)
Lymphs Abs: 1 10*3/uL (ref 0.7–4.0)
MCHC: 32.8 g/dL (ref 30.0–36.0)
MCV: 93.3 fl (ref 78.0–100.0)
Monocytes Absolute: 0.3 10*3/uL (ref 0.1–1.0)
Monocytes Relative: 7.9 % (ref 3.0–12.0)
Neutro Abs: 2.3 10*3/uL (ref 1.4–7.7)
Neutrophils Relative %: 60 % (ref 43.0–77.0)
Platelets: 138 10*3/uL — ABNORMAL LOW (ref 150.0–400.0)
RBC: 4.82 Mil/uL (ref 4.22–5.81)
RDW: 14.1 % (ref 11.5–15.5)
WBC: 3.8 10*3/uL — ABNORMAL LOW (ref 4.0–10.5)

## 2022-04-09 LAB — LIPID PANEL
Cholesterol: 186 mg/dL (ref 0–200)
HDL: 33.1 mg/dL — ABNORMAL LOW (ref >39.00)
NonHDL: 152.91
Total CHOL/HDL Ratio: 6
Triglycerides: 201 mg/dL — ABNORMAL HIGH (ref 0.0–149.0)
VLDL: 40.2 mg/dL — ABNORMAL HIGH (ref 0.0–40.0)

## 2022-04-09 LAB — HEPATIC FUNCTION PANEL
ALT: 19 U/L (ref 0–53)
AST: 20 U/L (ref 0–37)
Albumin: 4.2 g/dL (ref 3.5–5.2)
Alkaline Phosphatase: 80 U/L (ref 39–117)
Bilirubin, Direct: 0.2 mg/dL (ref 0.0–0.3)
Total Bilirubin: 1.3 mg/dL — ABNORMAL HIGH (ref 0.2–1.2)
Total Protein: 6.4 g/dL (ref 6.0–8.3)

## 2022-04-09 LAB — BASIC METABOLIC PANEL
BUN: 22 mg/dL (ref 6–23)
CO2: 26 mEq/L (ref 19–32)
Calcium: 9.2 mg/dL (ref 8.4–10.5)
Chloride: 106 mEq/L (ref 96–112)
Creatinine, Ser: 0.94 mg/dL (ref 0.40–1.50)
GFR: 74.33 mL/min (ref >60.00)
Glucose, Bld: 93 mg/dL (ref 70–99)
Potassium: 3.8 mEq/L (ref 3.5–5.1)
Sodium: 139 mEq/L (ref 135–145)

## 2022-04-09 LAB — TSH: TSH: 4.09 u[IU]/mL (ref 0.35–5.50)

## 2022-04-09 LAB — VITAMIN B12: Vitamin B-12: 164 pg/mL — ABNORMAL LOW (ref 211–911)

## 2022-04-09 LAB — LDL CHOLESTEROL, DIRECT: Direct LDL: 130 mg/dL

## 2022-04-09 NOTE — Assessment & Plan Note (Addendum)
Evaluated 09/2021 -  Right Carotid: Velocities in the right ICA are consistent with a 1-39% stenosis. Mild heterogeneous plaque in the bulb/proximal ICA. Left Carotid: Velocities in the left ICA are consistent with a 1-39% stenosis. Mild heterogeneous plaque in the bulb/proximal ICA. Vertebrals: Bilateral vertebral arteries demonstrate antegrade flow. Subclavians: Normal flow hemodynamics were seen in bilateral subclavian arteries.  Declines cholesterol medication.  Continue aspirin daily.  

## 2022-04-09 NOTE — Progress Notes (Signed)
[name], male   DOB: 27-Nov-1934, 87 y.o.   MRN: [id number]   Subjective:    [name], male    DOB: August 30, 1935, 87 y.o.   MRN: [id number]   Patient here for a scheduled follow up.   Chief Complaint  Patient presents with   Hyperlipidemia   .   Hyperlipidemia Pertinent negatives include no chest pain, myalgias or shortness of breath.   He reports he is doing relatively well. Stays active.  Plays golf.  Did injure his hip practicing his golf swing last week.  Taking two ibuprofen per day prn and this has helped.  Much better now.  Still some pain - lateral hip, but overall improved.  No chest pain or sob.  No acid reflux.  No abdominal pain.  Bowels moving.  No blood in stool.  Does report noticing some memory change.  Trouble remembering names.  Reports had shingles vaccine x 2 ([facility]).  Last 01/2022.    Past Medical History:  Diagnosis Date   Acoustic neuroma [facility])    Arthritis    hands   Cochlear implant status    bilateral   Complication of anesthesia    difficult intubation for one of his "head surgeries at [facility]" per pt.   History of colonic polyps    History of craniotomy    Hypercholesterolemia    Macular degeneration    Psoriasis    Rheumatic fever age 25   Rosacea    Scarlet fever age 23   Vertigo    Wears dentures    full upper, partial lower   Past Surgical History:  Procedure Laterality Date   APPENDECTOMY  1959   BUNIONECTOMY     CATARACT EXTRACTION W/PHACO Left 03/27/2019   Procedure: CATARACT EXTRACTION PHACO AND INTRAOCULAR LENS PLACEMENT ([place])  LEFT;  Surgeon: Eulogio Bear, MD;  Location: [place];  Service: Ophthalmology;  Laterality: Left;  IVA BLOCK   COCHLEAR IMPLANT     acute hearing loss   CRANIECTOMY FOR EXCISION OF ACOUSTIC NEUROMA  1994   craniotomy for meningoma     Family History  Problem Relation Age of Onset   Hypertension Mother    Heart disease Mother    Heart failure Mother     Cancer Father 57       prostate   Stroke Father    Heart disease Brother 52       CABG   Stroke Son    Colon cancer Neg Hx    Social History   Socioeconomic History   Marital status: Divorced    Spouse name: Not on file   Number of children: 3   Years of education: Not on file   Highest education level: Not on file  Occupational History   Not on file  Tobacco Use   Smoking status: Former    Packs/day: 1.50    Years: 40.00    Total pack years: 60.00    Types: Cigarettes    Quit date: 10/05/2000    Years since quitting: 21.5   Smokeless tobacco: Former    Quit date: 1990  Scientific laboratory technician Use: Never used  Substance and Sexual Activity   Alcohol use: Yes    Alcohol/week: 0.0 standard drinks of alcohol    Comment: occasional wine   Drug use: No   Sexual activity: Not on file  Other Topics Concern   Not on file  Social History Narrative  Not on file   Social Determinants of Health   Financial Resource Strain: Low Risk  (07/18/2021)   Overall Financial Resource Strain (CARDIA)    Difficulty of Paying Living Expenses: Not hard at all  Food Insecurity: No Food Insecurity (07/18/2021)   Hunger Vital Sign    Worried About Running Out of Food in the Last Year: Never true    Ran Out of Food in the Last Year: Never true  Transportation Needs: No Transportation Needs (07/18/2021)   PRAPARE - Hydrologist (Medical): No    Lack of Transportation (Non-Medical): No  Physical Activity: Sufficiently Active (07/18/2021)   Exercise Vital Sign    Days of Exercise per Week: 7 days    Minutes of Exercise per Session: 60 min  Stress: No Stress Concern Present (07/18/2021)   [place]    Feeling of Stress : Not at all  Social Connections: Moderately Integrated (07/18/2021)   Social Connection and Isolation Panel [NHANES]    Frequency of Communication with Friends and Family: More  than three times a week    Frequency of Social Gatherings with Friends and Family: More than three times a week    Attends Religious Services: More than 4 times per year    Active Member of [employer] or Organizations: Yes    Attends Music therapist: More than 4 times per year    Marital Status: Divorced     Review of Systems  Constitutional:  Negative for appetite change and unexpected weight change.  HENT:  Negative for congestion and sinus pressure.   Respiratory:  Negative for cough, chest tightness and shortness of breath.   Cardiovascular:  Negative for chest pain, palpitations and leg swelling.  Gastrointestinal:  Negative for abdominal pain, diarrhea, nausea and vomiting.  Genitourinary:  Negative for difficulty urinating and dysuria.  Musculoskeletal:  Negative for joint swelling and myalgias.       Left hip pain as outlined.   Skin:  Negative for color change and rash.  Neurological:  Negative for dizziness, light-headedness and headaches.  Psychiatric/Behavioral:  Negative for agitation and dysphoric mood.        Objective:     BP 126/80 (BP Location: Left Arm, Patient Position: Sitting, Cuff Size: Small)   Pulse (!) 58   Temp 98.1 F (36.7 C) (Temporal)   Resp 16   Ht '5\' 8"'$  (1.727 m)   Wt 180 lb 3.2 oz (81.7 kg)   SpO2 95%   BMI 27.40 kg/m  Wt Readings from Last 3 Encounters:  04/09/22 180 lb 3.2 oz (81.7 kg)  10/17/21 177 lb 12.8 oz (80.6 kg)  07/18/21 176 lb (79.8 kg)    Physical Exam Vitals reviewed.  Constitutional:      General: He is not in acute distress.    Appearance: Normal appearance. He is well-developed.  HENT:     Head: Normocephalic and atraumatic.     Right Ear: External ear normal.     Left Ear: External ear normal.  Eyes:     General: No scleral icterus.       Right eye: No discharge.        Left eye: No discharge.     Conjunctiva/sclera: Conjunctivae normal.  Cardiovascular:     Rate and Rhythm: Normal rate and regular  rhythm.  Pulmonary:     Effort: Pulmonary effort is normal. No respiratory distress.     Breath sounds:  Normal breath sounds.  Abdominal:     General: Bowel sounds are normal.     Palpations: Abdomen is soft.     Tenderness: There is no abdominal tenderness.  Musculoskeletal:        General: No swelling.     Cervical back: Neck supple. No tenderness.     Comments: Tenderness to palpation - left lateral hip.  Some minimal pain with flexion of leg at knee and minimal discomfort lateral hip with abduction/adduction of left lower leg.    Lymphadenopathy:     Cervical: No cervical adenopathy.  Skin:    Findings: No erythema or rash.  Neurological:     Mental Status: He is alert.  Psychiatric:        Mood and Affect: Mood normal.        Behavior: Behavior normal.      Outpatient Encounter Medications as of 04/09/2022  Medication Sig   aspirin EC 81 MG tablet Take 81 mg by mouth daily.   fluticasone (FLONASE) 50 MCG/ACT nasal spray SPRAY TWO SPRAYS IN EACH NOSTRIL ONCE DAILY   Multiple Vitamins-Minerals (VISION-VITE PRESERVE PO) Take by mouth.   timolol (TIMOPTIC) 0.5 % ophthalmic solution Place 1 drop into both eyes daily.   No facility-administered encounter medications on file as of 04/09/2022.     Lab Results  Component Value Date   WBC 3.8 (L) 04/09/2022   HGB 14.7 04/09/2022   HCT 45.0 04/09/2022   PLT 138.0 (L) 04/09/2022   GLUCOSE 93 04/09/2022   CHOL 186 04/09/2022   TRIG 201.0 (H) 04/09/2022   HDL 33.10 (L) 04/09/2022   LDLDIRECT 130.0 04/09/2022   LDLCALC 141 (H) 01/20/2018   ALT 19 04/09/2022   AST 20 04/09/2022   NA 139 04/09/2022   K 3.8 04/09/2022   CL 106 04/09/2022   CREATININE 0.94 04/09/2022   BUN 22 04/09/2022   CO2 26 04/09/2022   TSH 4.09 04/09/2022   PSA 0.43 04/16/2021      Assessment & Plan:   Problem List Items Addressed This Visit     Carotid artery disease ([place])    Evaluated 09/2021 -  Right Carotid: Velocities in the right ICA are  consistent with a 1-39% stenosis. Mild heterogeneous plaque in the bulb/proximal ICA. Left Carotid: Velocities in the left ICA are consistent with a 1-39% stenosis. Mild heterogeneous plaque in the bulb/proximal ICA. Vertebrals: Bilateral vertebral arteries demonstrate antegrade flow. Subclavians: Normal flow hemodynamics were seen in bilateral subclavian arteries.  Declines cholesterol medication.  Continue aspirin daily.       Hip pain    Injured recently as outlined.  Taking prn ibuprofen.  Helping.  Does not feel needs any further intervention.  Will notify me if persistent pain or change in symptoms.       Hypercholesterolemia - Primary    Intolerant to crestor and lipitor.  Low cholesterol diet and exercise.  Follow lipid panel.       Relevant Orders   Hepatic function panel (Completed)   Basic Metabolic Panel (BMET) (Completed)   Lipid Profile (Completed)   TSH (Completed)   Memory change    Reported noticing some difficulty remembering names.  No problems with directions, driving, finances, etc.  Discussed further w/up and evaluation.  Will check routine labs, including tsh and B12.  Follow. Discussed formal neurological evaluation.        Relevant Orders   TSH (Completed)   Vitamin B12 (Completed)   Thrombocytopenia ([place])    Follow  cbc.  Recheck today.       Relevant Orders   CBC w/Diff (Completed)   Other Visit Diagnoses     Hyperkalemia            Einar Pheasant, MD

## 2022-04-10 ENCOUNTER — Encounter: Payer: Self-pay | Admitting: Internal Medicine

## 2022-04-10 DIAGNOSIS — M25559 Pain in unspecified hip: Secondary | ICD-10-CM | POA: Insufficient documentation

## 2022-04-10 NOTE — Assessment & Plan Note (Signed)
Reported noticing some difficulty remembering names.  No problems with directions, driving, finances, etc.  Discussed further w/up and evaluation.  Will check routine labs, including tsh and B12.  Follow. Discussed formal neurological evaluation.

## 2022-04-10 NOTE — Assessment & Plan Note (Signed)
Injured recently as outlined.  Taking prn ibuprofen.  Helping.  Does not feel needs any further intervention.  Will notify me if persistent pain or change in symptoms.

## 2022-04-10 NOTE — Assessment & Plan Note (Signed)
Follow cbc.  Recheck today.   

## 2022-04-10 NOTE — Assessment & Plan Note (Signed)
Intolerant to crestor and lipitor.  Low cholesterol diet and exercise.  Follow lipid panel.

## 2022-04-13 ENCOUNTER — Telehealth: Payer: Self-pay

## 2022-04-13 NOTE — Telephone Encounter (Signed)
LMTCB for lab results. Pt needs to be scheduled for 4 weeks of B-12 injections.

## 2022-04-14 NOTE — Telephone Encounter (Signed)
Patient states he is returning our call.  I read Cheri Rous, CMA's message to patient and scheduled him for B-12 injections, once a week for four weeks, per [employer].  Patient states he did see his lab results on MyChart and we don't need to call him back regarding the labs, unless there is something we need to discuss with him.

## 2022-04-15 ENCOUNTER — Ambulatory Visit (INDEPENDENT_AMBULATORY_CARE_PROVIDER_SITE_OTHER): Payer: Medicare Other | Admitting: *Deleted

## 2022-04-15 DIAGNOSIS — E538 Deficiency of other specified B group vitamins: Secondary | ICD-10-CM | POA: Diagnosis not present

## 2022-04-15 MED ORDER — CYANOCOBALAMIN 1000 MCG/ML IJ SOLN
1000.0000 ug | Freq: Once | INTRAMUSCULAR | Status: AC
  Administered 2022-04-15: 1000 ug via INTRAMUSCULAR

## 2022-04-15 NOTE — Progress Notes (Addendum)
Patient presented for B12 injection to L Deltoid  Patient voiced no concerns nor showed any signs of distress during injection.

## 2022-04-16 ENCOUNTER — Ambulatory Visit: Payer: Medicare Other | Admitting: Internal Medicine

## 2022-04-22 ENCOUNTER — Ambulatory Visit (INDEPENDENT_AMBULATORY_CARE_PROVIDER_SITE_OTHER): Payer: Medicare Other | Admitting: *Deleted

## 2022-04-22 DIAGNOSIS — E538 Deficiency of other specified B group vitamins: Secondary | ICD-10-CM

## 2022-04-22 MED ORDER — CYANOCOBALAMIN 1000 MCG/ML IJ SOLN
1000.0000 ug | Freq: Once | INTRAMUSCULAR | Status: AC
  Administered 2022-04-22: 1000 ug via INTRAMUSCULAR

## 2022-04-22 NOTE — Progress Notes (Signed)
Pt received B12 injection in right deltoid. Pt tolerated it well with no complaints or concerns. 

## 2022-04-29 ENCOUNTER — Ambulatory Visit (INDEPENDENT_AMBULATORY_CARE_PROVIDER_SITE_OTHER): Payer: Medicare Other

## 2022-04-29 DIAGNOSIS — E538 Deficiency of other specified B group vitamins: Secondary | ICD-10-CM | POA: Diagnosis not present

## 2022-04-29 MED ORDER — CYANOCOBALAMIN 1000 MCG/ML IJ SOLN
1000.0000 ug | Freq: Once | INTRAMUSCULAR | Status: AC
  Administered 2022-04-29: 1000 ug via INTRAMUSCULAR

## 2022-04-29 NOTE — Progress Notes (Signed)
Patient presented for B 12 injection to left deltoid, patient voiced no concerns nor showed any signs of distress during injection. 

## 2022-05-06 ENCOUNTER — Ambulatory Visit (INDEPENDENT_AMBULATORY_CARE_PROVIDER_SITE_OTHER): Payer: Medicare Other

## 2022-05-06 DIAGNOSIS — E538 Deficiency of other specified B group vitamins: Secondary | ICD-10-CM | POA: Diagnosis not present

## 2022-05-06 MED ORDER — CYANOCOBALAMIN 1000 MCG/ML IJ SOLN
1000.0000 ug | Freq: Once | INTRAMUSCULAR | Status: AC
  Administered 2022-05-06: 1000 ug via INTRAMUSCULAR

## 2022-05-06 NOTE — Progress Notes (Signed)
Patient presented for B 12 injection to right deltoid, patient voiced no concerns nor showed any signs of distress during injection. 

## 2022-07-29 ENCOUNTER — Telehealth: Payer: Self-pay

## 2022-07-29 ENCOUNTER — Ambulatory Visit (INDEPENDENT_AMBULATORY_CARE_PROVIDER_SITE_OTHER): Payer: Medicare Other

## 2022-07-29 VITALS — Ht 68.0 in | Wt 180.0 lb

## 2022-07-29 DIAGNOSIS — Z0000 Encounter for general adult medical examination without abnormal findings: Secondary | ICD-10-CM

## 2022-07-29 NOTE — Progress Notes (Signed)
Subjective:   Benjamin Carney is a 87 y.o. male who presents for Medicare Annual/Subsequent preventive examination.  Review of Systems    No ROS.  Medicare Wellness Virtual Visit.  Visual/audio telehealth visit, UTA vital signs.   See social history for additional risk factors.   Cardiac Risk Factors include: advanced age (>26mn, >>67women);male gender     Objective:    Today's Vitals   07/29/22 0831  Weight: 180 lb (81.6 kg)  Height: '5\' 8"'$  (1.727 m)   Body mass index is 27.37 kg/m.     07/29/2022    8:42 AM 07/18/2021    8:34 AM 07/17/2019    8:46 AM 03/27/2019    7:29 AM 07/11/2018    8:38 AM 07/08/2017    4:09 PM  Advanced Directives  Does Patient Have a Medical Advance Directive? Yes Yes Yes Yes Yes Yes  Type of AParamedicof AWagnerLiving will HB[place]ntownLiving will HChina SpringLiving will HSan [place]Living will HMadera AcresLiving will HMcKin[place]Living will  Does patient want to make changes to medical advance directive? No - Patient declined No - Patient declined No - Patient declined No - Patient declined No - Patient declined No - Patient declined  Copy of HLake Davisin Chart? No - copy requested No - copy requested No - copy requested Yes - validated most recent copy scanned in chart (See row information) No - copy requested No - copy requested    Current Medications (verified) Outpatient Encounter Medications as of 07/29/2022  Medication Sig   aspirin EC 81 MG tablet Take 81 mg by mouth daily.   fluticasone (FLONASE) 50 MCG/ACT nasal spray SPRAY TWO SPRAYS IN EACH NOSTRIL ONCE DAILY   Multiple Vitamins-Minerals (VISION-VITE PRESERVE PO) Take by mouth.   timolol (TIMOPTIC) 0.5 % ophthalmic solution Place 1 drop into both eyes daily.   No facility-administered encounter medications on file as of 07/29/2022.    Allergies  (verified) Crestor [rosuvastatin calcium], Levonorgestrel-ethinyl estrad, and Metronidazole   History: Past Medical History:  Diagnosis Date   Acoustic neuroma ([place]    Arthritis    hands   Cochlear implant status    bilateral   Complication of anesthesia    [facility]   History of colonic polyps    History of craniotomy    Hypercholesterolemia    Macular degeneration    Psoriasis    Rheumatic fever age 87  Rosacea    Scarlet fever age 89  Vertigo    Wears dentures    full upper, partial lower   Past Surgical History:  Procedure Laterality Date   APPENDECTOMY  1959   BUNIONECTOMY     CATARACT EXTRACTION W/PHACO Left 03/27/2019   Procedure: CATARACT EXTRACTION PHACO AND INTRAOCULAR LENS PLACEMENT ([facility]  LEFT;  Surgeon: KEulogio Bear MD;  [place]  Service: Ophthalmology;  Laterality: Left;  IVA BLOCK   COCHLEAR IMPLANT     acute hearing loss   CRANIECTOMY FOR EXCISION OF ACOUSTIC NEUROMA  1994   craniotomy for meningoma     Family History  Problem Relation Age of Onset   Hypertension Mother    Heart disease Mother    Heart failure Mother    Cancer Father 918      prostate   Stroke Father    Heart disease Brother  36       CABG   Stroke Son    Colon cancer Neg Hx    Social History   Socioeconomic History   Marital status: Divorced    Spouse name: Not on file   Number of children: 3   Years of education: Not on file   Highest education level: Not on file  Occupational History   Not on file  Tobacco Use   Smoking status: Former    Packs/day: 1.50    Years: 40.00    Total pack years: 60.00    Types: Cigarettes    Quit date: 10/05/2000    Years since quitting: 21.8   Smokeless tobacco: Former    Quit date: 1990  Scientific laboratory technician Use: Never used  Substance and Sexual Activity   Alcohol use: Yes    Alcohol/week: 0.0 standard drinks of alcohol    Comment: occasional  wine   Drug use: No   Sexual activity: Not on file  Other Topics Concern   Not on file  Social History Narrative   Not on file   Social Determinants of Health   Financial Resource Strain: Low Risk  (07/25/2022)   Overall Financial Resource Strain (CARDIA)    Difficulty of Paying Living Expenses: Not very hard  Food Insecurity: No Food Insecurity (07/29/2022)   Hunger Vital Sign    Worried About Running Out of Food in the Last Year: Never true    Ran Out of Food in the Last Year: Never true  Transportation Needs: No Transportation Needs (07/29/2022)   PRAPARE - Hydrologist (Medical): No    Lack of Transportation (Non-Medical): No  Physical Activity: Sufficiently Active (07/29/2022)   Exercise Vital Sign    Days of Exercise per Week: 6 days    Minutes of Exercise per Session: 50 min  Stress: No Stress Concern Present (07/29/2022)   [place]    Feeling of Stress : Not at all  Social Connections: Unknown (07/29/2022)   Social Connection and Isolation Panel [NHANES]    Frequency of Communication with Friends and Family: More than three times a week    Frequency of Social Gatherings with Friends and Family: More than three times a week    Attends Religious Services: Not on Advertising copywriter or Organizations: Yes    Attends Music therapist: More than 4 times per year    Marital Status: Divorced    Tobacco Counseling Counseling given: Not Answered   Clinical Intake:  Pre-visit preparation completed: Yes        Diabetes: No  How often do you need to have someone help you when you read instructions, pamphlets, or other written materials from your doctor or pharmacy?: 1 - Never    Interpreter Needed?: No      Activities of Daily Living    07/29/2022    8:39 AM 07/25/2022   10:42 AM  In your present state of health, do you have any  difficulty performing the following activities:  Hearing? 1 1  Vision? 0 0  Difficulty concentrating or making decisions? 0 0  Walking or climbing stairs? 0 0  Comment Paces self   Dressing or bathing? 0 0  Doing errands, shopping? 0 0  Preparing Food and eating ? N N  Using the Toilet? N N  In the past six months, have you accidently leaked  urine? N N  Do you have problems with loss of bowel control? N N  Managing your Medications? N N  Managing your Finances? N N  Housekeeping or managing your Housekeeping? N N    Patient Care Team: Einar Pheasant, MD as PCP - General (Internal Medicine)  Indicate any recent Medical Services you may have received from other than [facility] providers in the past year (date may be approximate).     Assessment:   This is a routine wellness examination for Garry.  I connected with  ANTOINNE SPADACCINI on 07/29/22 by a audio enabled telemedicine application and verified that I am speaking with the correct person using two identifiers.  Patient Location: Home  Provider Location: Office/Clinic  I discussed the limitations of evaluation and management by telemedicine. The patient expressed understanding and agreed to proceed.   Hearing/Vision screen Hearing Screening - Comments:: Followed by [facility]  Visits every 12 months  Cochlear implant Vision Screening - Comments:: Followed by [facility]  Wears corrective lenses  Visits every 6 months.  Cataract extraction, bilateral They have regular follow up with the ophthalmologist  Dietary issues and exercise activities discussed: Current Exercise Habits: Home exercise routine, Type of exercise: walking, Time (Minutes): 30, Frequency (Times/Week): 7, Weekly Exercise (Minutes/Week): 210, Intensity: Mild   Goals Addressed               This Visit's Progress     Patient Stated     Maintain Healthy Lifestyle (pt-stated)        Stay active. Stay hydrated. Healthy diet.       Depression  Screen    07/29/2022    8:41 AM 04/09/2022    7:11 AM 07/18/2021    8:32 AM 04/16/2021    7:37 AM 07/17/2020    8:43 AM 07/17/2019    8:40 AM 07/11/2018    8:39 AM  PHQ 2/9 Scores  PHQ - 2 Score 0 0 0 0 0 0 0    Fall Risk    07/29/2022    8:41 AM 07/25/2022   10:42 AM 04/09/2022    7:10 AM 07/18/2021    8:36 AM 04/16/2021    7:36 AM  Fall Risk   Falls in the past year? 0 0 0 0 1  Number falls in past yr: 0 0 0  1  Injury with Fall?  0 0  0  Risk for fall due to :   History of fall(s)    Follow up Falls evaluation completed  Falls evaluation completed Falls evaluation completed Falls evaluation completed    [place]: Home free of loose throw rugs in walkways, pet beds, electrical cords, etc? Yes  Adequate lighting in your home to reduce risk of falls? Yes   ASSISTIVE DEVICES UTILIZED TO PREVENT FALLS: Life alert? No  Use of a cane, walker or w/c? No  Grab bars in the bathroom? No  Shower chair or bench in shower? No  Elevated toilet seat or a handicapped toilet? No   TIMED UP AND GO: Was the test performed? No .   Cognitive Function:    07/11/2018    8:45 AM  MMSE - Mini Mental State Exam  Orientation to time 5  Orientation to Place 5  Registration 3  Attention/ Calculation 5  Recall 3  Language- name 2 objects 2  Language- repeat 1  Language- follow 3 step command 3  Language- read & follow direction 1  Write  a sentence 1  Copy design 1  Total score 30        07/29/2022    8:46 AM 07/17/2019    8:54 AM 07/08/2017    4:23 PM  6CIT Screen  What Year? 0 points 0 points 0 points  What month? 0 points 0 points 0 points  What time? 0 points 0 points 0 points  Count back from 20 0 points 0 points 0 points  Months in reverse 0 points 0 points 0 points  Repeat phrase 0 points 0 points 0 points  Total Score 0 points 0 points 0 points    Immunizations Immunization History  Administered Date(s) Administered   Fluad  Quad(high Dose 65+) 06/16/2019   Influenza Split 09/11/2011, 08/15/2012   Influenza, High Dose Seasonal PF 07/21/2017, 07/14/2018   Influenza,inj,Quad PF,6+ Mos 08/25/2013, 10/10/2014, 09/03/2015, 07/29/2016   PFIZER(Purple Top)SARS-COV-2 Vaccination 10/12/2019, 11/04/2019   Pneumococcal Conjugate-13 12/07/2013   Pneumococcal Polysaccharide-23 06/05/2009   Zoster, Live 09/22/2010   TDAP status: Due, Education has been provided regarding the importance of this vaccine. Advised may receive this vaccine at local pharmacy or Health Dept. Aware to provide a copy of the vaccination record if obtained from local pharmacy or Health Dept. Verbalized acceptance and understanding.  Covid-19 vaccine status: Completed vaccines  Shingrix Completed?: No.    Education has been provided regarding the importance of this vaccine. Patient has been advised to call insurance company to determine out of pocket expense if they have not yet received this vaccine. Advised may also receive vaccine at local pharmacy or Health Dept. Verbalized acceptance and understanding.  Screening Tests Health Maintenance  Topic Date Due   COVID-19 Vaccine (3 - Pfizer series) 08/14/2022 (Originally 12/30/2019)   TETANUS/TDAP  09/04/2022 (Originally 07/20/1954)   Zoster Vaccines- Shingrix (1 of 2) 09/04/2022 (Originally 07/21/1987)   Medicare Annual Wellness (AWV)  08/29/2023   Pneumonia Vaccine 74+ Years old  Completed   HPV VACCINES  Aged Out   INFLUENZA VACCINE  Discontinued    Health Maintenance There are no preventive care reminders to display for this patient.  DG Chest 2 View completed 2019.   Hepatitis C Screening: Completed does not qualify.  Vision Screening: Recommended annual ophthalmology exams for early detection of glaucoma and other disorders of the eye.  Dental Screening: Recommended annual dental exams for proper oral hygiene  Community Resource Referral / Chronic Care Management: CRR required this  visit?  No   CCM required this visit?  No      Plan:  Low B12 level per lab 3 months ago. Order read to have x4 weekly injections and x1 monthly injection afterward. Recheck levels.  Last B12 injection received 05/06/22. No weekly injections scheduled or given.Telephone note sent to PCP for follow up.   I have personally reviewed and noted the following in the patient's chart:   Medical and social history Use of alcohol, tobacco or illicit drugs  Current medications and supplements including opioid prescriptions. Patient is not currently taking opioid prescriptions. Functional ability and status Nutritional status Physical activity Advanced directives List of other physicians Hospitalizations, surgeries, and ER visits in previous 12 months Vitals Screenings to include cognitive, depression, and falls Referrals and appointments  In addition, I have reviewed and discussed with patient certain preventive protocols, quality metrics, and best practice recommendations. A written personalized care plan for preventive services as well as general preventive health recommendations were provided to patient.     Leta Jungling, LPN  07/29/2022       

## 2022-07-29 NOTE — Telephone Encounter (Signed)
Low B12 level per lab 3 months ago (04/09/22). Order reads to have x4 weekly injections and x1 monthly injection afterward. Recheck levels.  Last B12 injection received 05/06/22. No weekly injections have been scheduled or given.

## 2022-07-29 NOTE — Patient Instructions (Addendum)
Mr. Benjamin Carney , Thank you for taking time to come for your Medicare Wellness Visit. I appreciate your ongoing commitment to your health goals. Please review the following plan we discussed and let me know if I can assist you in the future.   These are the goals we discussed:  Goals       Patient Stated     Maintain Healthy Lifestyle (pt-stated)      Stay active. Stay hydrated. Healthy diet.        This is a list of the screening recommended for you and due dates:  Health Maintenance  Topic Date Due   COVID-19 Vaccine (3 - Pfizer series) 08/14/2022*   Tetanus Vaccine  09/04/2022*   Zoster (Shingles) Vaccine (1 of 2) 09/04/2022*   Medicare Annual Wellness Visit  08/29/2023   Pneumonia Vaccine  Completed   HPV Vaccine  Aged Out   Flu Shot  Discontinued  *Topic was postponed. The date shown is not the original due date.    Advanced directives: End of life planning; Advance aging; Advanced directives discussed.  Copy of current HCPOA/Living Will requested.    Conditions/risks identified: none new  Next appointment: Follow up in one year for your annual wellness visit.   Preventive Care 51 Years and Older, Male  Preventive care refers to lifestyle choices and visits with your health care provider that can promote health and wellness. What does preventive care include? A yearly physical exam. This is also called an annual well check. Dental exams once or twice a year. Routine eye exams. Ask your health care provider how often you should have your eyes checked. Personal lifestyle choices, including: Daily care of your teeth and gums. Regular physical activity. Eating a healthy diet. Avoiding tobacco and drug use. Limiting alcohol use. Practicing safe sex. Taking low doses of aspirin every day. Taking vitamin and mineral supplements as recommended by your health care provider. What happens during an annual well check? The services and screenings done by your health care  provider during your annual well check will depend on your age, overall health, lifestyle risk factors, and family history of disease. Counseling  Your health care provider may ask you questions about your: Alcohol use. Tobacco use. Drug use. Emotional well-being. Home and relationship well-being. Sexual activity. Eating habits. History of falls. Memory and ability to understand (cognition). Work and work Statistician. Screening  You may have the following tests or measurements: Height, weight, and BMI. Blood pressure. Lipid and cholesterol levels. These may be checked every 5 years, or more frequently if you are over 58 years old. Skin check. Lung cancer screening. You may have this screening every year starting at age 30 if you have a 30-pack-year history of smoking and currently smoke or have quit within the past 15 years. Fecal occult blood test (FOBT) of the stool. You may have this test every year starting at age 45. Flexible sigmoidoscopy or colonoscopy. You may have a sigmoidoscopy every 5 years or a colonoscopy every 10 years starting at age 29. Prostate cancer screening. Recommendations will vary depending on your family history and other risks. Hepatitis C blood test. Hepatitis B blood test. Sexually transmitted disease (STD) testing. Diabetes screening. This is done by checking your blood sugar (glucose) after you have not eaten for a while (fasting). You may have this done every 1-3 years. Abdominal aortic aneurysm (AAA) screening. You may need this if you are a current or former smoker. Osteoporosis. You may be screened starting at  age 59 if you are at high risk. Talk with your health care provider about your test results, treatment options, and if necessary, the need for more tests. Vaccines  Your health care provider may recommend certain vaccines, such as: Influenza vaccine. This is recommended every year. Tetanus, diphtheria, and acellular pertussis (Tdap, Td)  vaccine. You may need a Td booster every 10 years. Zoster vaccine. You may need this after age 30. Pneumococcal 13-valent conjugate (PCV13) vaccine. One dose is recommended after age 13. Pneumococcal polysaccharide (PPSV23) vaccine. One dose is recommended after age 65. Talk to your health care provider about which screenings and vaccines you need and how often you need them. This information is not intended to replace advice given to you by your health care provider. Make sure you discuss any questions you have with your health care provider. Document Released: 10/18/2015 Document Revised: 06/10/2016 Document Reviewed: 07/23/2015 Elsevier Interactive Patient Education  2017 [employer] Prevention in the Home Falls can cause injuries. They can happen to people of all ages. There are many things you can do to make your home safe and to help prevent falls. What can I do on the outside of my home? Regularly fix the edges of walkways and driveways and fix any cracks. Remove anything that might make you trip as you walk through a door, such as a raised step or threshold. Trim any bushes or trees on the path to your home. Use bright outdoor lighting. Clear any walking paths of anything that might make someone trip, such as rocks or tools. Regularly check to see if handrails are loose or broken. Make sure that both sides of any steps have handrails. Any raised decks and porches should have guardrails on the edges. Have any leaves, snow, or ice cleared regularly. Use sand or salt on walking paths during winter. Clean up any spills in your garage right away. This includes oil or grease spills. What can I do in the bathroom? Use night lights. Install grab bars by the toilet and in the tub and shower. Do not use towel bars as grab bars. Use non-skid mats or decals in the tub or shower. If you need to sit down in the shower, use a plastic, non-slip stool. Keep the floor dry. Clean up any  water that spills on the floor as soon as it happens. Remove soap buildup in the tub or shower regularly. Attach bath mats securely with double-sided non-slip rug tape. Do not have throw rugs and other things on the floor that can make you trip. What can I do in the bedroom? Use night lights. Make sure that you have a light by your bed that is easy to reach. Do not use any sheets or blankets that are too big for your bed. They should not hang down onto the floor. Have a firm chair that has side arms. You can use this for support while you get dressed. Do not have throw rugs and other things on the floor that can make you trip. What can I do in the kitchen? Clean up any spills right away. Avoid walking on wet floors. Keep items that you use a lot in easy-to-reach places. If you need to reach something above you, use a strong step stool that has a grab bar. Keep electrical cords out of the way. Do not use floor polish or wax that makes floors slippery. If you must use wax, use non-skid floor wax. Do not have throw rugs and  other things on the floor that can make you trip. What can I do with my stairs? Do not leave any items on the stairs. Make sure that there are handrails on both sides of the stairs and use them. Fix handrails that are broken or loose. Make sure that handrails are as long as the stairways. Check any carpeting to make sure that it is firmly attached to the stairs. Fix any carpet that is loose or worn. Avoid having throw rugs at the top or bottom of the stairs. If you do have throw rugs, attach them to the floor with carpet tape. Make sure that you have a light switch at the top of the stairs and the bottom of the stairs. If you do not have them, ask someone to add them for you. What else can I do to help prevent falls? Wear shoes that: Do not have high heels. Have rubber bottoms. Are comfortable and fit you well. Are closed at the toe. Do not wear sandals. If you use a  stepladder: Make sure that it is fully opened. Do not climb a closed stepladder. Make sure that both sides of the stepladder are locked into place. Ask someone to hold it for you, if possible. Clearly mark and make sure that you can see: Any grab bars or handrails. First and last steps. Where the edge of each step is. Use tools that help you move around (mobility aids) if they are needed. These include: Canes. Walkers. Scooters. Crutches. Turn on the lights when you go into a dark area. Replace any light bulbs as soon as they burn out. Set up your furniture so you have a clear path. Avoid moving your furniture around. If any of your floors are uneven, fix them. If there are any pets around you, be aware of where they are. Review your medicines with your doctor. Some medicines can make you feel dizzy. This can increase your chance of falling. Ask your doctor what other things that you can do to help prevent falls. This information is not intended to replace advice given to you by your health care provider. Make sure you discuss any questions you have with your health care provider. Document Released: 07/18/2009 Document Revised: 02/27/2016 Document Reviewed: 10/26/2014 Elsevier Interactive Patient Education  2017 [employer].

## 2022-07-30 NOTE — Telephone Encounter (Signed)
Please notify Benjamin Carney that I would like to continue his B12 injections.  Start 1018mg q month.

## 2022-08-03 ENCOUNTER — Encounter (INDEPENDENT_AMBULATORY_CARE_PROVIDER_SITE_OTHER): Payer: Self-pay

## 2022-08-05 NOTE — Telephone Encounter (Signed)
Lm for pt re :  NEEDS APPT FOR B12 INJ

## 2022-08-07 ENCOUNTER — Ambulatory Visit (INDEPENDENT_AMBULATORY_CARE_PROVIDER_SITE_OTHER): Payer: Medicare Other | Admitting: *Deleted

## 2022-08-07 DIAGNOSIS — E538 Deficiency of other specified B group vitamins: Secondary | ICD-10-CM

## 2022-08-07 MED ORDER — CYANOCOBALAMIN 1000 MCG/ML IJ SOLN
1000.0000 ug | Freq: Once | INTRAMUSCULAR | Status: AC
  Administered 2022-08-07: 1000 ug via INTRAMUSCULAR

## 2022-08-07 NOTE — Progress Notes (Signed)
Pt  received B12 injection in left deltoid. Pt tolerated it well with no concerns or complaints.

## 2022-08-18 NOTE — Telephone Encounter (Signed)
LM for pt to cb 

## 2022-08-24 NOTE — Telephone Encounter (Signed)
3RD ATTEMPT - LM FOR PT TO CB

## 2022-09-04 ENCOUNTER — Ambulatory Visit (INDEPENDENT_AMBULATORY_CARE_PROVIDER_SITE_OTHER): Payer: Medicare Other

## 2022-09-04 DIAGNOSIS — E538 Deficiency of other specified B group vitamins: Secondary | ICD-10-CM | POA: Diagnosis not present

## 2022-09-04 MED ORDER — CYANOCOBALAMIN 1000 MCG/ML IJ SOLN
1000.0000 ug | Freq: Once | INTRAMUSCULAR | Status: AC
  Administered 2022-09-04: 1000 ug via INTRAMUSCULAR

## 2022-09-04 NOTE — Progress Notes (Signed)
Pt arrived for B12 injection, given in L deltoid. Pt tolerated injection well, showed no signs of distress nor voiced any concerns.  ?

## 2022-10-01 ENCOUNTER — Encounter (INDEPENDENT_AMBULATORY_CARE_PROVIDER_SITE_OTHER): Payer: Medicare Other

## 2022-10-01 ENCOUNTER — Ambulatory Visit (INDEPENDENT_AMBULATORY_CARE_PROVIDER_SITE_OTHER): Payer: Medicare Other | Admitting: Vascular Surgery

## 2022-10-05 HISTORY — PX: OTHER SURGICAL HISTORY: SHX169

## 2022-10-15 ENCOUNTER — Other Ambulatory Visit (INDEPENDENT_AMBULATORY_CARE_PROVIDER_SITE_OTHER): Payer: Self-pay | Admitting: Vascular Surgery

## 2022-10-15 DIAGNOSIS — I6523 Occlusion and stenosis of bilateral carotid arteries: Secondary | ICD-10-CM

## 2022-10-19 ENCOUNTER — Ambulatory Visit (INDEPENDENT_AMBULATORY_CARE_PROVIDER_SITE_OTHER): Payer: Medicare Other | Admitting: Internal Medicine

## 2022-10-19 ENCOUNTER — Encounter: Payer: Self-pay | Admitting: Internal Medicine

## 2022-10-19 VITALS — BP 128/70 | HR 67 | Temp 97.5°F | Resp 13 | Ht 67.5 in | Wt 172.0 lb

## 2022-10-19 DIAGNOSIS — D696 Thrombocytopenia, unspecified: Secondary | ICD-10-CM

## 2022-10-19 DIAGNOSIS — H9190 Unspecified hearing loss, unspecified ear: Secondary | ICD-10-CM

## 2022-10-19 DIAGNOSIS — Z86018 Personal history of other benign neoplasm: Secondary | ICD-10-CM

## 2022-10-19 DIAGNOSIS — Z0000 Encounter for general adult medical examination without abnormal findings: Secondary | ICD-10-CM | POA: Diagnosis not present

## 2022-10-19 DIAGNOSIS — I779 Disorder of arteries and arterioles, unspecified: Secondary | ICD-10-CM

## 2022-10-19 DIAGNOSIS — Z86010 Personal history of colonic polyps: Secondary | ICD-10-CM

## 2022-10-19 DIAGNOSIS — E7800 Pure hypercholesterolemia, unspecified: Secondary | ICD-10-CM | POA: Diagnosis not present

## 2022-10-19 DIAGNOSIS — R634 Abnormal weight loss: Secondary | ICD-10-CM

## 2022-10-19 DIAGNOSIS — E538 Deficiency of other specified B group vitamins: Secondary | ICD-10-CM | POA: Diagnosis not present

## 2022-10-19 DIAGNOSIS — R413 Other amnesia: Secondary | ICD-10-CM

## 2022-10-19 LAB — LIPID PANEL
Cholesterol: 211 mg/dL — ABNORMAL HIGH (ref 0–200)
HDL: 34.1 mg/dL — ABNORMAL LOW (ref >39.00)
NonHDL: 177.03
Total CHOL/HDL Ratio: 6
Triglycerides: 245 mg/dL — ABNORMAL HIGH (ref 0.0–149.0)
VLDL: 49 mg/dL — ABNORMAL HIGH (ref 0.0–40.0)

## 2022-10-19 LAB — CBC WITH DIFFERENTIAL/PLATELET
Basophils Absolute: 0 10*3/uL (ref 0.0–0.1)
Basophils Relative: 0.5 % (ref 0.0–3.0)
Eosinophils Absolute: 0.1 10*3/uL (ref 0.0–0.7)
Eosinophils Relative: 3 % (ref 0.0–5.0)
HCT: 48 % (ref 39.0–52.0)
Hemoglobin: 15.9 g/dL (ref 13.0–17.0)
Lymphocytes Relative: 26.3 % (ref 12.0–46.0)
Lymphs Abs: 1.3 10*3/uL (ref 0.7–4.0)
MCHC: 33.2 g/dL (ref 30.0–36.0)
MCV: 92.1 fl (ref 78.0–100.0)
Monocytes Absolute: 0.4 10*3/uL (ref 0.1–1.0)
Monocytes Relative: 8.1 % (ref 3.0–12.0)
Neutro Abs: 3.1 10*3/uL (ref 1.4–7.7)
Neutrophils Relative %: 62.1 % (ref 43.0–77.0)
Platelets: 137 10*3/uL — ABNORMAL LOW (ref 150.0–400.0)
RBC: 5.21 Mil/uL (ref 4.22–5.81)
RDW: 14 % (ref 11.5–15.5)
WBC: 5 10*3/uL (ref 4.0–10.5)

## 2022-10-19 LAB — BASIC METABOLIC PANEL
BUN: 19 mg/dL (ref 6–23)
CO2: 28 mEq/L (ref 19–32)
Calcium: 9.2 mg/dL (ref 8.4–10.5)
Chloride: 105 mEq/L (ref 96–112)
Creatinine, Ser: 0.94 mg/dL (ref 0.40–1.50)
GFR: 74.05 mL/min (ref >60.00)
Glucose, Bld: 90 mg/dL (ref 70–99)
Potassium: 4.2 mEq/L (ref 3.5–5.1)
Sodium: 140 mEq/L (ref 135–145)

## 2022-10-19 LAB — HEPATIC FUNCTION PANEL
ALT: 22 U/L (ref 0–53)
AST: 20 U/L (ref 0–37)
Albumin: 4.3 g/dL (ref 3.5–5.2)
Alkaline Phosphatase: 89 U/L (ref 39–117)
Bilirubin, Direct: 0.2 mg/dL (ref 0.0–0.3)
Total Bilirubin: 1.6 mg/dL — ABNORMAL HIGH (ref 0.2–1.2)
Total Protein: 6.4 g/dL (ref 6.0–8.3)

## 2022-10-19 LAB — LDL CHOLESTEROL, DIRECT: Direct LDL: 141 mg/dL

## 2022-10-19 MED ORDER — CYANOCOBALAMIN 1000 MCG/ML IJ SOLN
1000.0000 ug | Freq: Once | INTRAMUSCULAR | Status: AC
  Administered 2022-10-19: 1000 ug via INTRAMUSCULAR

## 2022-10-19 MED ORDER — CYANOCOBALAMIN 1000 MCG/ML IJ SOLN: 1000.0000 ug | Freq: Once | INTRAMUSCULAR | 0 refills | Status: DC

## 2022-10-19 NOTE — Progress Notes (Unsigned)
Subjective:    [name], male    DOB: 01-20-1935, 88 y.o.   MRN: [id number]  Patient here for  Chief Complaint  Patient presents with   Annual Exam    CPE    HPI Here for physical exam. Stays active. Previously had been deer hunting.  Before Thanksgiving, developed a respiratory infection.  Had persistent cough and hoarseness.  Stayed home.  Ate soup.  Stayed hydrated.  Fatigue is better.  He feels more like his normal self.  No significant residual problems.  No chest pain or sob reported.  No abdominal pain or bowel change reported.  Had previously reported memory change.  W/up found B12 deficiency.  Receiving B12 injections.  Wants to start getting at home.  Still some memory issues - misplaces keys.  Discussed further w/up. Wants to monitor.     Past Medical History:  Diagnosis Date   Acoustic neuroma [facility])    Arthritis    hands   Cochlear implant status    bilateral   Complication of anesthesia    difficult intubation for one of his "head surgeries at [facility]" per pt.   History of colonic polyps    History of craniotomy    Hypercholesterolemia    Macular degeneration    Psoriasis    Rheumatic fever age 39   Rosacea    Scarlet fever age 33   Vertigo    Wears dentures    full upper, partial lower   Past Surgical History:  Procedure Laterality Date   APPENDECTOMY  1959   BUNIONECTOMY     CATARACT EXTRACTION W/PHACO Left 03/27/2019   Procedure: CATARACT EXTRACTION PHACO AND INTRAOCULAR LENS PLACEMENT ([place])  LEFT;  Surgeon: Eulogio Bear, MD;  Location: [place];  Service: Ophthalmology;  Laterality: Left;  IVA BLOCK   COCHLEAR IMPLANT     acute hearing loss   CRANIECTOMY FOR EXCISION OF ACOUSTIC NEUROMA  1994   craniotomy for meningoma     Family History  Problem Relation Age of Onset   Hypertension Mother    Heart disease Mother    Heart failure Mother    Cancer Father 62       prostate   Stroke Father    Heart disease Brother 40        CABG   Stroke Son    Colon cancer Neg Hx    Social History   Socioeconomic History   Marital status: Divorced    Spouse name: Not on file   Number of children: 3   Years of education: Not on file   Highest education level: Not on file  Occupational History   Not on file  Tobacco Use   Smoking status: Former    Packs/day: 1.50    Years: 40.00    Total pack years: 60.00    Types: Cigarettes    Quit date: 10/05/2000    Years since quitting: 22.0   Smokeless tobacco: Former    Quit date: 1990  Scientific laboratory technician Use: Never used  Substance and Sexual Activity   Alcohol use: Yes    Alcohol/week: 0.0 standard drinks of alcohol    Comment: occasional wine   Drug use: No   Sexual activity: Not on file  Other Topics Concern   Not on file  Social History Narrative   Not on file   Social Determinants of Health   Financial Resource Strain: Low Risk  (07/29/2022)   Overall  Financial Resource Strain (CARDIA)    Difficulty of Paying Living Expenses: Not very hard  Food Insecurity: No Food Insecurity (07/29/2022)   Hunger Vital Sign    Worried About Running Out of Food in the Last Year: Never true    Ran Out of Food in the Last Year: Never true  Transportation Needs: No Transportation Needs (07/29/2022)   PRAPARE - Hydrologist (Medical): No    Lack of Transportation (Non-Medical): No  Physical Activity: Sufficiently Active (07/29/2022)   Exercise Vital Sign    Days of Exercise per Week: 6 days    Minutes of Exercise per Session: 50 min  Stress: No Stress Concern Present (07/29/2022)   [place]    Feeling of Stress : Not at all  Social Connections: Unknown (07/29/2022)   Social Connection and Isolation Panel [NHANES]    Frequency of Communication with Friends and Family: More than three times a week    Frequency of Social Gatherings with Friends and Family: More than three  times a week    Attends Religious Services: Not on Advertising copywriter or Organizations: Yes    Attends Archivist Meetings: More than 4 times per year    Marital Status: Divorced     Review of Systems  Constitutional:  Negative for appetite change and unexpected weight change.  HENT:  Negative for congestion, sinus pressure and sore throat.   Eyes:  Negative for pain and visual disturbance.  Respiratory:  Negative for cough, chest tightness and shortness of breath.   Cardiovascular:  Negative for chest pain, palpitations and leg swelling.  Gastrointestinal:  Negative for abdominal pain, diarrhea, nausea and vomiting.  Genitourinary:  Negative for difficulty urinating and dysuria.  Musculoskeletal:  Negative for joint swelling and myalgias.  Skin:  Negative for color change and rash.  Neurological:  Negative for dizziness and headaches.  Hematological:  Negative for adenopathy. Does not bruise/bleed easily.  Psychiatric/Behavioral:  Negative for agitation and dysphoric mood.        Objective:     BP 128/70 (BP Location: Left Arm, Patient Position: Sitting, Cuff Size: Large)   Pulse 67   Temp (!) 97.5 F (36.4 C) (Temporal)   Resp 13   Ht 5' 7.5" (1.715 m)   Wt 172 lb (78 kg)   SpO2 96%   BMI 26.54 kg/m  Wt Readings from Last 3 Encounters:  10/19/22 172 lb (78 kg)  07/29/22 180 lb (81.6 kg)  04/09/22 180 lb 3.2 oz (81.7 kg)    Physical Exam Constitutional:      General: He is not in acute distress.    Appearance: Normal appearance. He is well-developed.  HENT:     Head: Normocephalic and atraumatic.     Right Ear: External ear normal.     Left Ear: External ear normal.  Eyes:     General: No scleral icterus.       Right eye: No discharge.        Left eye: No discharge.     Conjunctiva/sclera: Conjunctivae normal.  Neck:     Thyroid: No thyromegaly.  Cardiovascular:     Rate and Rhythm: Normal rate and regular rhythm.  Pulmonary:      Effort: No respiratory distress.     Breath sounds: Normal breath sounds. No wheezing.  Abdominal:     General: Bowel sounds are normal.  Palpations: Abdomen is soft.     Tenderness: There is no abdominal tenderness.  Musculoskeletal:        General: No swelling or tenderness.     Cervical back: Neck supple. No tenderness.  Lymphadenopathy:     Cervical: No cervical adenopathy.  Skin:    Findings: No erythema or rash.  Neurological:     Mental Status: He is alert and oriented to person, place, and time.  Psychiatric:        Mood and Affect: Mood normal.        Behavior: Behavior normal.      Outpatient Encounter Medications as of 10/19/2022  Medication Sig   aspirin EC 81 MG tablet Take 81 mg by mouth daily.   [EXPIRED] cyanocobalamin (VITAMIN B12) 1000 MCG/ML injection Inject 1 mL (1,000 mcg total) into the muscle once for 1 dose.   fluticasone (FLONASE) 50 MCG/ACT nasal spray SPRAY TWO SPRAYS IN EACH NOSTRIL ONCE DAILY   Multiple Vitamins-Minerals (VISION-VITE PRESERVE PO) Take by mouth.   timolol (TIMOPTIC) 0.5 % ophthalmic solution Place 1 drop into both eyes daily.   [EXPIRED] cyanocobalamin (VITAMIN B12) injection 1,000 mcg    No facility-administered encounter medications on file as of 10/19/2022.     Lab Results  Component Value Date   WBC 5.0 10/19/2022   HGB 15.9 10/19/2022   HCT 48.0 10/19/2022   PLT 137.0 (L) 10/19/2022   GLUCOSE 90 10/19/2022   CHOL 211 (H) 10/19/2022   TRIG 245.0 (H) 10/19/2022   HDL 34.10 (L) 10/19/2022   LDLDIRECT 141.0 10/19/2022   LDLCALC 141 (H) 01/20/2018   ALT 22 10/19/2022   AST 20 10/19/2022   NA 140 10/19/2022   K 4.2 10/19/2022   CL 105 10/19/2022   CREATININE 0.94 10/19/2022   BUN 19 10/19/2022   CO2 28 10/19/2022   TSH 4.09 04/09/2022   PSA 0.43 04/16/2021    No results found.     Assessment & Plan:  Routine general medical examination at a health care facility  Hypercholesterolemia Assessment &  Plan: Intolerant to crestor and lipitor.  Low cholesterol diet and exercise.  Follow lipid panel. Recheck today.   Orders: -     Hepatic function panel -     Basic metabolic panel -     Lipid panel  Thrombocytopenia (HCC) Assessment & Plan: Follow cbc.  Recheck today.   Orders: -     CBC with Differential/Platelet  B12 deficiency -     Cyanocobalamin  Right-sided carotid artery disease, unspecified type [facility]) Assessment & Plan: Evaluated 09/2021 -  Right Carotid: Velocities in the right ICA are consistent with a 1-39% stenosis. Mild heterogeneous plaque in the bulb/proximal ICA. Left Carotid: Velocities in the left ICA are consistent with a 1-39% stenosis. Mild heterogeneous plaque in the bulb/proximal ICA. Vertebrals: Bilateral vertebral arteries demonstrate antegrade flow. Subclavians: Normal flow hemodynamics were seen in bilateral subclavian arteries.  Declines cholesterol medication.  Continue aspirin daily.    Health care maintenance Assessment & Plan: Physical today 10/19/22.  Colonoscopy 2012 - with polyps.  Recommended f/u in 3-5 years.  Discussed again today.  Declines.  PSA.  (04/16/21 - .43)   Hearing loss, unspecified hearing loss type, unspecified laterality Assessment & Plan: S/p cochlea implant.  Followed by ENT.    History of colonic polyps Assessment & Plan: Colonoscopy 2012 with polyp removed.  GI had recommended f/u in 3-5 years.  Have discussed f/u colonoscopy with him.  He has declined and  continues to decline.  Notify me if changes mind.     History of meningioma Assessment & Plan: S/p removal.  Was followed by neurosurgery.  No f/u warranted.  Doing well.    Memory change Assessment & Plan: Reported noticing some difficulty remembering names.  No problems with directions, driving, finances, etc.  Discussed further w/up and evaluation.  B12 low.  Receiving injections now.  Wants to start getting at home.  Daughter can give. Follow. Discussed  formal neurological evaluation.  Desires to monitor.    Weight loss Assessment & Plan: Weight down some.  Eating well.  Follow.    Other orders -     Cyanocobalamin; Inject 1 mL (1,000 mcg total) into the muscle once for 1 dose.  Dispense: 1 mL; Refill: 0 -     LDL cholesterol, direct     Einar Pheasant, MD

## 2022-10-20 ENCOUNTER — Encounter: Payer: Self-pay | Admitting: Internal Medicine

## 2022-10-20 NOTE — Assessment & Plan Note (Signed)
Follow cbc.  Recheck today.   

## 2022-10-20 NOTE — Assessment & Plan Note (Signed)
S/p removal.  Was followed by neurosurgery.  No f/u warranted.  Doing well.

## 2022-10-20 NOTE — Assessment & Plan Note (Signed)
Reported noticing some difficulty remembering names.  No problems with directions, driving, finances, etc.  Discussed further w/up and evaluation.  B12 low.  Receiving injections now.  Wants to start getting at home.  Daughter can give. Follow. Discussed formal neurological evaluation.  Desires to monitor.

## 2022-10-20 NOTE — Assessment & Plan Note (Signed)
Physical today 10/19/22.  Colonoscopy 2012 - with polyps.  Recommended f/u in 3-5 years.  Discussed again today.  Declines.  PSA.  (04/16/21 - .43)

## 2022-10-20 NOTE — Assessment & Plan Note (Signed)
Evaluated 09/2021 -  Right Carotid: Velocities in the right ICA are consistent with a 1-39% stenosis. Mild heterogeneous plaque in the bulb/proximal ICA. Left Carotid: Velocities in the left ICA are consistent with a 1-39% stenosis. Mild heterogeneous plaque in the bulb/proximal ICA. Vertebrals: Bilateral vertebral arteries demonstrate antegrade flow. Subclavians: Normal flow hemodynamics were seen in bilateral subclavian arteries.  Declines cholesterol medication.  Continue aspirin daily.

## 2022-10-20 NOTE — Assessment & Plan Note (Signed)
Colonoscopy 2012 with polyp removed.  GI had recommended f/u in 3-5 years.  Have discussed f/u colonoscopy with him.  He has declined and continues to decline.  Notify me if changes mind.

## 2022-10-20 NOTE — Assessment & Plan Note (Signed)
S/p cochlea implant.  Followed by ENT.

## 2022-10-20 NOTE — Assessment & Plan Note (Signed)
Weight down some.  Eating well.  Follow.

## 2022-10-20 NOTE — Assessment & Plan Note (Signed)
Intolerant to crestor and lipitor.  Low cholesterol diet and exercise.  Follow lipid panel. Recheck today.

## 2022-10-21 ENCOUNTER — Other Ambulatory Visit: Payer: Self-pay

## 2022-10-21 DIAGNOSIS — D696 Thrombocytopenia, unspecified: Secondary | ICD-10-CM

## 2022-10-21 DIAGNOSIS — R7989 Other specified abnormal findings of blood chemistry: Secondary | ICD-10-CM

## 2022-10-25 NOTE — Progress Notes (Signed)
MRN : [id number]  Benjamin Carney is a 88 y.o. (12/14/1934) male who presents with chief complaint of check carotid arteries.  History of Present Illness:   The patient is seen for evaluation of carotid stenosis. The carotid stenosis was identified after a scan of his jaw in preparation for prosthodontic surgery in 10/16/2020.  Scan showed calcifications in the region of the carotid arteries.    The patient denies amaurosis fugax. There is no recent history of TIA symptoms or focal motor deficits. There is no prior documented CVA.   There is no history of migraine headaches. There is no history of seizures.   The patient is taking enteric-coated aspirin 81 mg daily.   The patient does not have a significant history of coronary artery disease, no recent episodes of angina or shortness of breath. The patient denies PAD or claudication symptoms he continues to golf and hunt on a regular basis. There is a history of hyperlipidemia which is being treated with a statin.  Duplex ultrasound of the carotid arteries demonstrates RICA 1-88% and LICA 4-16%.  No significant change compared with previous study.  No outpatient medications have been marked as taking for the 10/26/22 encounter (Appointment) with Delana Meyer, Dolores Lory, MD.    Past Medical History:  Diagnosis Date   Acoustic neuroma [facility])    Arthritis    hands   Cochlear implant status    bilateral   Complication of anesthesia    difficult intubation for one of his "head surgeries at [facility]" per pt.   History of colonic polyps    History of craniotomy    Hypercholesterolemia    Macular degeneration    Psoriasis    Rheumatic fever age 54   Rosacea    Scarlet fever age 27   Vertigo    Wears dentures    full upper, partial lower    Past Surgical History:  Procedure Laterality Date   APPENDECTOMY  1959   BUNIONECTOMY     CATARACT EXTRACTION W/PHACO Left 03/27/2019   Procedure: CATARACT EXTRACTION PHACO AND INTRAOCULAR LENS  PLACEMENT ([place])  LEFT;  Surgeon: Eulogio Bear, MD;  Location: [place];  Service: Ophthalmology;  Laterality: Left;  IVA BLOCK   COCHLEAR IMPLANT     acute hearing loss   CRANIECTOMY FOR EXCISION OF ACOUSTIC NEUROMA  1994   craniotomy for meningoma      Social History Social History   Tobacco Use   Smoking status: Former    Packs/day: 1.50    Years: 40.00    Total pack years: 60.00    Types: Cigarettes    Quit date: 10/05/2000    Years since quitting: 22.0   Smokeless tobacco: Former    Quit date: 1990  Scientific laboratory technician Use: Never used  Substance Use Topics   Alcohol use: Yes    Alcohol/week: 0.0 standard drinks of alcohol    Comment: occasional wine   Drug use: No    Family History Family History  Problem Relation Age of Onset   Hypertension Mother    Heart disease Mother    Heart failure Mother    Cancer Father 75       prostate   Stroke Father    Heart disease Brother 82       CABG   Stroke Son    Colon cancer Neg Hx     Allergies  Allergen Reactions   Crestor [Rosuvastatin Calcium] Other (See Comments)  Myalgias    Levonorgestrel-Ethinyl Estrad Other (See Comments)   Metronidazole Rash    Flagyl might have caused rash.     REVIEW OF SYSTEMS (Negative unless checked)  Constitutional: '[]'$ Weight loss  '[]'$ Fever  '[]'$ Chills Cardiac: '[]'$ Chest pain   '[]'$ Chest pressure   '[]'$ Palpitations   '[]'$ Shortness of breath when laying flat   '[]'$ Shortness of breath with exertion. Vascular:  '[x]'$ Pain in legs with walking   '[]'$ Pain in legs at rest  '[]'$ History of DVT   '[]'$ Phlebitis   '[]'$ Swelling in legs   '[]'$ Varicose veins   '[]'$ Non-healing ulcers Pulmonary:   '[]'$ Uses home oxygen   '[]'$ Productive cough   '[]'$ Hemoptysis   '[]'$ Wheeze  '[]'$ COPD   '[]'$ Asthma Neurologic:  '[]'$ Dizziness   '[]'$ Seizures   '[]'$ History of stroke   '[]'$ History of TIA  '[]'$ Aphasia   '[]'$ Vissual changes   '[]'$ Weakness or numbness in arm   '[]'$ Weakness or numbness in leg Musculoskeletal:   '[]'$ Joint swelling   '[x]'$ Joint pain    '[]'$ Low back pain Hematologic:  '[]'$ Easy bruising  '[]'$ Easy bleeding   '[]'$ Hypercoagulable state   '[]'$ Anemic Gastrointestinal:  '[]'$ Diarrhea   '[]'$ Vomiting  '[x]'$ Gastroesophageal reflux/heartburn   '[]'$ Difficulty swallowing. Genitourinary:  '[]'$ Chronic kidney disease   '[]'$ Difficult urination  '[]'$ Frequent urination   '[]'$ Blood in urine Skin:  '[]'$ Rashes   '[]'$ Ulcers  Psychological:  '[]'$ History of anxiety   '[]'$  History of major depression.  Physical Examination  There were no vitals filed for this visit. There is no height or weight on file to calculate BMI. Gen: WD/WN, NAD Head: [place]/AT, No temporalis wasting.  Ear/Nose/Throat: Hearing grossly intact, nares w/o erythema or drainage Eyes: PER, EOMI, sclera nonicteric.  Neck: Supple, no masses.  No bruit or JVD.  Pulmonary:  Good air movement, no audible wheezing, no use of accessory muscles.  Cardiac: RRR, normal S1, S2, no Murmurs. Vascular:  carotid bruit noted Vessel Right Left  Radial Palpable Palpable  Carotid  Palpable  Palpable  Gastrointestinal: soft, non-distended. No guarding/no peritoneal signs.  Musculoskeletal: M/S 5/5 throughout.  No visible deformity.  Neurologic: CN 2-12 intact. Pain and light touch intact in extremities.  Symmetrical.  Speech is fluent. Motor exam as listed above. Psychiatric: Judgment intact, Mood & affect appropriate for pt's clinical situation. Dermatologic: No rashes or ulcers noted.  No changes consistent with cellulitis.   CBC Lab Results  Component Value Date   WBC 5.0 10/19/2022   HGB 15.9 10/19/2022   HCT 48.0 10/19/2022   MCV 92.1 10/19/2022   PLT 137.0 (L) 10/19/2022    BMET    Component Value Date/Time   NA 140 10/19/2022 1103   K 4.2 10/19/2022 1103   CL 105 10/19/2022 1103   CO2 28 10/19/2022 1103   GLUCOSE 90 10/19/2022 1103   BUN 19 10/19/2022 1103   CREATININE 0.94 10/19/2022 1103   CALCIUM 9.2 10/19/2022 1103   Estimated Creatinine Clearance: 54.7 mL/min (by C-G formula based on SCr of 0.94  mg/dL).  COAG No results found for: "INR", "PROTIME"  Radiology No results found.   Assessment/Plan 1. Right-sided carotid artery disease, unspecified type ([place]) Recommend:  Given the patient's asymptomatic subcritical stenosis no further invasive testing or surgery at this time.  Duplex ultrasound shows 1-39% ICA stenosis bilaterally.  Continue antiplatelet therapy as prescribed Continue management of CAD, HTN and Hyperlipidemia Healthy heart diet,  encouraged exercise at least 4 times per week Follow up in 24 months (2 years) with duplex ultrasound and physical exam   2. Hypercholesterolemia Continue statin as ordered and reviewed, no  changes at this time  3. Bilateral carotid artery stenosis Recommend:  Given the patient's asymptomatic subcritical stenosis no further invasive testing or surgery at this time.  Duplex ultrasound shows 1-39% ICA stenosis bilaterally.  Continue antiplatelet therapy as prescribed Continue management of CAD, HTN and Hyperlipidemia Healthy heart diet,  encouraged exercise at least 4 times per week Follow up in 24 months (2 years) with duplex ultrasound and physical exam   - VAS US CAROTID    Hortencia Pilar, MD  10/25/2022 2:58 PM

## 2022-10-26 ENCOUNTER — Encounter (INDEPENDENT_AMBULATORY_CARE_PROVIDER_SITE_OTHER): Payer: Self-pay | Admitting: Vascular Surgery

## 2022-10-26 ENCOUNTER — Ambulatory Visit (INDEPENDENT_AMBULATORY_CARE_PROVIDER_SITE_OTHER): Payer: Medicare Other

## 2022-10-26 ENCOUNTER — Ambulatory Visit (INDEPENDENT_AMBULATORY_CARE_PROVIDER_SITE_OTHER): Payer: Medicare Other | Admitting: Vascular Surgery

## 2022-10-26 VITALS — BP 120/70 | HR 66 | Ht 67.5 in | Wt 175.0 lb

## 2022-10-26 DIAGNOSIS — E7800 Pure hypercholesterolemia, unspecified: Secondary | ICD-10-CM | POA: Diagnosis not present

## 2022-10-26 DIAGNOSIS — I779 Disorder of arteries and arterioles, unspecified: Secondary | ICD-10-CM | POA: Diagnosis not present

## 2022-10-26 DIAGNOSIS — I6523 Occlusion and stenosis of bilateral carotid arteries: Secondary | ICD-10-CM

## 2022-12-02 ENCOUNTER — Other Ambulatory Visit (INDEPENDENT_AMBULATORY_CARE_PROVIDER_SITE_OTHER): Payer: Medicare Other

## 2022-12-02 DIAGNOSIS — R7989 Other specified abnormal findings of blood chemistry: Secondary | ICD-10-CM | POA: Diagnosis not present

## 2022-12-02 DIAGNOSIS — D696 Thrombocytopenia, unspecified: Secondary | ICD-10-CM | POA: Diagnosis not present

## 2022-12-02 LAB — HEPATIC FUNCTION PANEL
ALT: 20 U/L (ref 0–53)
AST: 22 U/L (ref 0–37)
Albumin: 3.8 g/dL (ref 3.5–5.2)
Alkaline Phosphatase: 86 U/L (ref 39–117)
Bilirubin, Direct: 0.2 mg/dL (ref 0.0–0.3)
Total Bilirubin: 1.3 mg/dL — ABNORMAL HIGH (ref 0.2–1.2)
Total Protein: 6.2 g/dL (ref 6.0–8.3)

## 2022-12-02 LAB — CBC WITH DIFFERENTIAL/PLATELET
Basophils Absolute: 0 10*3/uL (ref 0.0–0.1)
Basophils Relative: 0.4 % (ref 0.0–3.0)
Eosinophils Absolute: 0.1 10*3/uL (ref 0.0–0.7)
Eosinophils Relative: 3.6 % (ref 0.0–5.0)
HCT: 47.7 % (ref 39.0–52.0)
Hemoglobin: 15.9 g/dL (ref 13.0–17.0)
Lymphocytes Relative: 22.7 % (ref 12.0–46.0)
Lymphs Abs: 0.9 10*3/uL (ref 0.7–4.0)
MCHC: 33.4 g/dL (ref 30.0–36.0)
MCV: 91.6 fl (ref 78.0–100.0)
Monocytes Absolute: 0.4 10*3/uL (ref 0.1–1.0)
Monocytes Relative: 9.4 % (ref 3.0–12.0)
Neutro Abs: 2.6 10*3/uL (ref 1.4–7.7)
Neutrophils Relative %: 63.9 % (ref 43.0–77.0)
Platelets: 146 10*3/uL — ABNORMAL LOW (ref 150.0–400.0)
RBC: 5.21 Mil/uL (ref 4.22–5.81)
RDW: 14.2 % (ref 11.5–15.5)
WBC: 4 10*3/uL (ref 4.0–10.5)

## 2022-12-04 ENCOUNTER — Other Ambulatory Visit: Payer: Self-pay

## 2022-12-04 DIAGNOSIS — E538 Deficiency of other specified B group vitamins: Secondary | ICD-10-CM

## 2022-12-04 DIAGNOSIS — D696 Thrombocytopenia, unspecified: Secondary | ICD-10-CM

## 2022-12-04 DIAGNOSIS — R7989 Other specified abnormal findings of blood chemistry: Secondary | ICD-10-CM

## 2022-12-04 MED ORDER — CYANOCOBALAMIN 1000 MCG/ML IJ SOLN: 1000.0000 ug | INTRAMUSCULAR | 6 refills | Status: DC

## 2022-12-04 MED ORDER — "SYRINGE/NEEDLE (DISP) 25G X 1"" 3 ML MISC": 0 refills | Status: AC

## 2022-12-08 ENCOUNTER — Ambulatory Visit
Admission: EM | Admit: 2022-12-08 | Discharge: 2022-12-08 | Disposition: A | Discharge status: Home / Self Care | Payer: Medicare Other | Attending: Emergency Medicine | Admitting: Emergency Medicine

## 2022-12-08 DIAGNOSIS — J0110 Acute frontal sinusitis, unspecified: Secondary | ICD-10-CM | POA: Diagnosis not present

## 2022-12-08 DIAGNOSIS — J209 Acute bronchitis, unspecified: Secondary | ICD-10-CM | POA: Diagnosis not present

## 2022-12-08 MED ORDER — AZITHROMYCIN 250 MG PO TABS: 250.0000 mg | ORAL_TABLET | Freq: Every day | ORAL | 0 refills | Status: DC

## 2022-12-08 NOTE — Discharge Instructions (Addendum)
Take the Zithromax as directed.  Follow up with your primary care provider if your symptoms are not improving.

## 2022-12-08 NOTE — ED Provider Notes (Signed)
Roderic Palau    CSN: [id number] Arrival date & time: 12/08/22  1151      History   Chief Complaint Chief Complaint  Patient presents with   Fever   Cough    HPI Benjamin Carney is a 88 y.o. male.  Patient presents with 10 day history of congestion, postnasal drip, and cough.  He feels short of breath with exertion at times.  He also reports some diarrhea.  At the onset of his illness, he had fever and body aches but these symptoms resolved.  He denies ear pain, sore throat, chest pain, vomiting, or other symptoms. Treating symptoms with Mucinex.     The history is provided by the patient and medical records.    Past Medical History:  Diagnosis Date   Acoustic neuroma [facility])    Arthritis    hands   Cochlear implant status    bilateral   Complication of anesthesia    difficult intubation for one of his "head surgeries at [facility]" per pt.   History of colonic polyps    History of craniotomy    Hypercholesterolemia    Macular degeneration    Psoriasis    Rheumatic fever age 77   Rosacea    Scarlet fever age 40   Vertigo    Wears dentures    full upper, partial lower    Patient Active Problem List   Diagnosis Date Noted   Hip pain 04/10/2022   Memory change 04/09/2022   Ear lesion 02/15/2021   Weight loss 02/13/2021   Carotid artery disease ([place]) 08/24/2020   SOB (shortness of breath) on exertion 01/20/2018   History of colonic polyps 07/14/2016   Thrombocytopenia ([place]) 12/17/2014   Health care maintenance 12/17/2014   AMD (age related macular degeneration) 03/10/2013   Hearing loss 10/30/2012   History of meningioma 10/30/2012   Hypercholesterolemia 10/30/2012    Past Surgical History:  Procedure Laterality Date   APPENDECTOMY  1959   BUNIONECTOMY     CATARACT EXTRACTION W/PHACO Left 03/27/2019   Procedure: CATARACT EXTRACTION PHACO AND INTRAOCULAR LENS PLACEMENT ([place])  LEFT;  Surgeon: Eulogio Bear, MD;  Location: [facility];  Service:  Ophthalmology;  Laterality: Left;  IVA BLOCK   COCHLEAR IMPLANT     acute hearing loss   CRANIECTOMY FOR EXCISION OF ACOUSTIC NEUROMA  1994   craniotomy for meningoma         Home Medications    Prior to Admission medications   Medication Sig Start Date End Date Taking? Authorizing Provider  azithromycin (ZITHROMAX) 250 MG tablet Take 1 tablet (250 mg total) by mouth daily. Take first 2 tablets together, then 1 every day until finished. 12/08/22  Yes Sharion Balloon, NP  aspirin EC 81 MG tablet Take 81 mg by mouth daily.    [provider]  cyanocobalamin (VITAMIN B12) 1000 MCG/ML injection Inject 1 mL (1,000 mcg total) into the muscle every 30 (thirty) days. 12/04/22   Einar Pheasant, MD  fluticasone [facility]) 50 MCG/ACT nasal spray SPRAY TWO SPRAYS IN EACH NOSTRIL ONCE DAILY 12/23/21   Kennyth Arnold, FNP  Multiple Vitamins-Minerals (VISION-VITE PRESERVE PO) Take by mouth.    [provider]  SYRINGE-NEEDLE, DISP, 3 ML 25G X 1" 3 ML MISC Use to inject b12 into muscle every 30 days 12/04/22   Einar Pheasant, MD  timolol (TIMOPTIC) 0.5 % ophthalmic solution Place 1 drop into both eyes daily. 11/14/14   [provider]  Family History Family History  Problem Relation Age of Onset   Hypertension Mother    Heart disease Mother    Heart failure Mother    Cancer Father 58       prostate   Stroke Father    Heart disease Brother 18       CABG   Stroke Son    Colon cancer Neg Hx     Social History Social History   Tobacco Use   Smoking status: Former    Packs/day: 1.50    Years: 40.00    Total pack years: 60.00    Types: Cigarettes    Quit date: 10/05/2000    Years since quitting: 22.1   Smokeless tobacco: Former    Quit date: 1990  Scientific laboratory technician Use: Never used  Substance Use Topics   Alcohol use: Yes    Alcohol/week: 0.0 standard drinks of alcohol    Comment: occasional wine   Drug use: No     Allergies   Crestor [rosuvastatin  calcium], Levonorgestrel-ethinyl estrad, and Metronidazole   Review of Systems Review of Systems  Constitutional:  Positive for fever. Negative for chills.  HENT:  Positive for congestion and postnasal drip. Negative for ear pain and sore throat.   Respiratory:  Positive for cough and shortness of breath.   Cardiovascular:  Negative for chest pain and palpitations.  Gastrointestinal:  Positive for diarrhea and nausea. Negative for abdominal pain and vomiting.     Physical Exam Triage Vital Signs ED Triage Vitals [12/08/22 1211]  Enc Vitals Group     BP      Pulse Rate 78     Resp 18     Temp 97.8 F (36.6 C)     Temp src      SpO2 97 %     Weight      Height      Head Circumference      Peak Flow      Pain Score      Pain Loc      Pain Edu?      Excl. in [place]?    No data found.  Updated Vital Signs BP 104/69   Pulse 78   Temp 97.8 F (36.6 C)   Resp 18   SpO2 97%   Visual Acuity Right Eye Distance:   Left Eye Distance:   Bilateral Distance:    Right Eye Near:   Left Eye Near:    Bilateral Near:     Physical Exam Vitals and nursing note reviewed.  Constitutional:      General: He is not in acute distress.    Appearance: Normal appearance. He is well-developed. He is not ill-appearing.  HENT:     Right Ear: Tympanic membrane normal.     Left Ear: Tympanic membrane normal.     Nose: Rhinorrhea present.     Mouth/Throat:     Mouth: Mucous membranes are moist.     Pharynx: Oropharynx is clear.  Cardiovascular:     Rate and Rhythm: Normal rate and regular rhythm.     Heart sounds: Normal heart sounds.  Pulmonary:     Effort: Pulmonary effort is normal. No respiratory distress.     Breath sounds: Normal breath sounds. No wheezing, rhonchi or rales.  Musculoskeletal:     Cervical back: Neck supple.  Skin:    General: Skin is warm and dry.  Neurological:     Mental Status: He is alert.  Psychiatric:        Mood and Affect: Mood normal.         Behavior: Behavior normal.      UC Treatments / Results  Labs (all labs ordered are listed, but only abnormal results are displayed) Labs Reviewed - No data to display  EKG   Radiology No results found.  Procedures Procedures (including critical care time)  Medications Ordered in UC Medications - No data to display  Initial Impression / Assessment and Plan / UC Course  I have reviewed the triage vital signs and the nursing notes.  Pertinent labs & imaging results that were available during my care of the patient were reviewed by me and considered in my medical decision making (see chart for details).    Acute bronchitis and sinusitis.  Afebrile, VSS.  Patient has been symptomatic for 10 days.  Treating with Zithromax as patient reports this has worked well for him in the past.  Education provided on bronchitis.  Instructed patient to follow-up with his PCP if his symptoms are not improving.  ED precautions given.  He agrees to plan of care.  Final Clinical Impressions(s) / UC Diagnoses   Final diagnoses:  Acute bronchitis, unspecified organism  Acute non-recurrent frontal sinusitis     Discharge Instructions      Take the Zithromax as directed.  Follow up with your primary care provider if your symptoms are not improving.        ED Prescriptions     Medication Sig Dispense Auth. Provider   azithromycin (ZITHROMAX) 250 MG tablet Take 1 tablet (250 mg total) by mouth daily. Take first 2 tablets together, then 1 every day until finished. 6 tablet Sharion Balloon, NP      PDMP not reviewed this encounter.   Sharion Balloon, NP 12/08/22 1255

## 2022-12-08 NOTE — ED Triage Notes (Signed)
Patient to Urgent Care with complaints of  fevers (intermittent), shortness of breath, and intermittently productive cough. Sinus congestion/ drainage.   Symptoms started ten days ago. Has been taking mucinex/ pushing fluids.

## 2023-02-03 ENCOUNTER — Other Ambulatory Visit (INDEPENDENT_AMBULATORY_CARE_PROVIDER_SITE_OTHER): Payer: Medicare Other

## 2023-02-03 DIAGNOSIS — R7989 Other specified abnormal findings of blood chemistry: Secondary | ICD-10-CM

## 2023-02-03 DIAGNOSIS — D696 Thrombocytopenia, unspecified: Secondary | ICD-10-CM

## 2023-02-03 LAB — CBC WITH DIFFERENTIAL/PLATELET
Basophils Absolute: 0 10*3/uL (ref 0.0–0.1)
Basophils Relative: 0.5 % (ref 0.0–3.0)
Eosinophils Absolute: 0.2 10*3/uL (ref 0.0–0.7)
Eosinophils Relative: 3.5 % (ref 0.0–5.0)
HCT: 46.9 % (ref 39.0–52.0)
Hemoglobin: 15.8 g/dL (ref 13.0–17.0)
Lymphocytes Relative: 24.9 % (ref 12.0–46.0)
Lymphs Abs: 1.1 10*3/uL (ref 0.7–4.0)
MCHC: 33.7 g/dL (ref 30.0–36.0)
MCV: 92.2 fl (ref 78.0–100.0)
Monocytes Absolute: 0.3 10*3/uL (ref 0.1–1.0)
Monocytes Relative: 7 % (ref 3.0–12.0)
Neutro Abs: 2.7 10*3/uL (ref 1.4–7.7)
Neutrophils Relative %: 64.1 % (ref 43.0–77.0)
Platelets: 148 10*3/uL — ABNORMAL LOW (ref 150.0–400.0)
RBC: 5.08 Mil/uL (ref 4.22–5.81)
RDW: 14.4 % (ref 11.5–15.5)
WBC: 4.3 10*3/uL (ref 4.0–10.5)

## 2023-02-03 LAB — HEPATIC FUNCTION PANEL
ALT: 20 U/L (ref 0–53)
AST: 20 U/L (ref 0–37)
Albumin: 4.2 g/dL (ref 3.5–5.2)
Alkaline Phosphatase: 91 U/L (ref 39–117)
Bilirubin, Direct: 0.1 mg/dL (ref 0.0–0.3)
Total Bilirubin: 0.9 mg/dL (ref 0.2–1.2)
Total Protein: 6.5 g/dL (ref 6.0–8.3)

## 2023-02-04 ENCOUNTER — Telehealth: Payer: Self-pay

## 2023-02-04 NOTE — Telephone Encounter (Signed)
-----   Message from Dale , MD sent at 02/04/2023  4:56 AM EDT ----- Notify - platelet count stable.  Still just slightly decreased, but stable (actually improved some from recent checks).  We will follow.  Will plan for recheck at next appt.  Liver panel (including bilirubin level) wnl.

## 2023-04-19 ENCOUNTER — Ambulatory Visit: Payer: Medicare Other | Admitting: Internal Medicine

## 2023-04-28 ENCOUNTER — Ambulatory Visit: Payer: Medicare Other | Admitting: Internal Medicine

## 2023-04-28 VITALS — BP 128/74 | HR 64 | Temp 97.9°F | Resp 16 | Ht 68.0 in | Wt 179.0 lb

## 2023-04-28 DIAGNOSIS — R011 Cardiac murmur, unspecified: Secondary | ICD-10-CM

## 2023-04-28 DIAGNOSIS — R319 Hematuria, unspecified: Secondary | ICD-10-CM | POA: Diagnosis not present

## 2023-04-28 DIAGNOSIS — E7800 Pure hypercholesterolemia, unspecified: Secondary | ICD-10-CM | POA: Diagnosis not present

## 2023-04-28 DIAGNOSIS — Z87828 Personal history of other (healed) physical injury and trauma: Secondary | ICD-10-CM

## 2023-04-28 DIAGNOSIS — Z86018 Personal history of other benign neoplasm: Secondary | ICD-10-CM

## 2023-04-28 DIAGNOSIS — D696 Thrombocytopenia, unspecified: Secondary | ICD-10-CM

## 2023-04-28 DIAGNOSIS — I779 Disorder of arteries and arterioles, unspecified: Secondary | ICD-10-CM

## 2023-04-28 DIAGNOSIS — Z86010 Personal history of colonic polyps: Secondary | ICD-10-CM

## 2023-04-28 DIAGNOSIS — S30861A Insect bite (nonvenomous) of abdominal wall, initial encounter: Secondary | ICD-10-CM

## 2023-04-28 LAB — CBC WITH DIFFERENTIAL/PLATELET
Basophils Absolute: 0 10*3/uL (ref 0.0–0.1)
Basophils Relative: 0.6 % (ref 0.0–3.0)
Eosinophils Absolute: 0.2 10*3/uL (ref 0.0–0.7)
Eosinophils Relative: 3.3 % (ref 0.0–5.0)
HCT: 47.7 % (ref 39.0–52.0)
Hemoglobin: 15.5 g/dL (ref 13.0–17.0)
Lymphocytes Relative: 25.9 % (ref 12.0–46.0)
Lymphs Abs: 1.3 10*3/uL (ref 0.7–4.0)
MCHC: 32.5 g/dL (ref 30.0–36.0)
MCV: 93.4 fl (ref 78.0–100.0)
Monocytes Absolute: 0.4 10*3/uL (ref 0.1–1.0)
Monocytes Relative: 7.6 % (ref 3.0–12.0)
Neutro Abs: 3.1 10*3/uL (ref 1.4–7.7)
Neutrophils Relative %: 62.6 % (ref 43.0–77.0)
Platelets: 155 10*3/uL (ref 150.0–400.0)
RBC: 5.11 Mil/uL (ref 4.22–5.81)
RDW: 14 % (ref 11.5–15.5)
WBC: 4.9 10*3/uL (ref 4.0–10.5)

## 2023-04-28 LAB — BASIC METABOLIC PANEL
BUN: 27 mg/dL — ABNORMAL HIGH (ref 6–23)
CO2: 28 mEq/L (ref 19–32)
Calcium: 9.3 mg/dL (ref 8.4–10.5)
Chloride: 104 mEq/L (ref 96–112)
Creatinine, Ser: 0.99 mg/dL (ref 0.40–1.50)
GFR: 69.34 mL/min (ref >60.00)
Glucose, Bld: 95 mg/dL (ref 70–99)
Potassium: 4.6 mEq/L (ref 3.5–5.1)
Sodium: 139 mEq/L (ref 135–145)

## 2023-04-28 LAB — TSH: TSH: 2.83 u[IU]/mL (ref 0.35–5.50)

## 2023-04-28 LAB — URINALYSIS, ROUTINE W REFLEX MICROSCOPIC
Bilirubin Urine: NEGATIVE
Hgb urine dipstick: NEGATIVE
Ketones, ur: NEGATIVE
Leukocytes,Ua: NEGATIVE
Nitrite: NEGATIVE
Specific Gravity, Urine: 1.025 (ref 1.000–1.030)
Total Protein, Urine: NEGATIVE
Urine Glucose: NEGATIVE
Urobilinogen, UA: 0.2 (ref 0.0–1.0)
pH: 5.5 (ref 5.0–8.0)

## 2023-04-28 LAB — LIPID PANEL
Cholesterol: 212 mg/dL — ABNORMAL HIGH (ref 0–200)
HDL: 35 mg/dL — ABNORMAL LOW (ref >39.00)
NonHDL: 177.31
Total CHOL/HDL Ratio: 6
Triglycerides: 319 mg/dL — ABNORMAL HIGH (ref 0.0–149.0)
VLDL: 63.8 mg/dL — ABNORMAL HIGH (ref 0.0–40.0)

## 2023-04-28 LAB — LDL CHOLESTEROL, DIRECT: Direct LDL: 132 mg/dL

## 2023-04-28 LAB — HEPATIC FUNCTION PANEL
ALT: 26 U/L (ref 0–53)
AST: 23 U/L (ref 0–37)
Albumin: 4.3 g/dL (ref 3.5–5.2)
Alkaline Phosphatase: 79 U/L (ref 39–117)
Bilirubin, Direct: 0.1 mg/dL (ref 0.0–0.3)
Total Bilirubin: 1.1 mg/dL (ref 0.2–1.2)
Total Protein: 6.7 g/dL (ref 6.0–8.3)

## 2023-04-28 NOTE — Progress Notes (Signed)
Subjective:    [name], male    DOB: 02/23/1935, 88 y.o.   MRN: [id number]  Patient here for  Chief Complaint  Patient presents with   Medical Management of Chronic Issues    HPI Here to follow up regarding hypercholesterolemia and memory change. (intolerant to lipitor and crestor).  Saw [facility] 10/2022 - Duplex ultrasound of the carotid arteries demonstrates RICA 1-39% and LICA 1-39%.  No significant change compared with previous study.  Recommended f/u in 24 months. Doing well.  Stays active. Plays golf.  No chest pain or sob reported.  Did notice a tick - lower abdomen.  Removed - 03/09/23.  Had a local reaction initially.  Denies any other rash. No fever, headache or joint aches.  Does report previously feeling weakness in his legs.  Resolved now. No focal abnormality. Does report noticing blood in his urine two weeks ago.  Lasted for approximately 2 days.  Also had an episode 3 months ago.  No blood now.  Some previous left side pain - no pain now.     Past Medical History:  Diagnosis Date   Acoustic neuroma [facility])    Arthritis    hands   Cochlear implant status    bilateral   Complication of anesthesia    difficult intubation for one of his "head surgeries at [facility]" per pt.   History of colonic polyps    History of craniotomy    Hypercholesterolemia    Macular degeneration    Psoriasis    Rheumatic fever age 24   Rosacea    Scarlet fever age 31   Vertigo    Wears dentures    full upper, partial lower   Past Surgical History:  Procedure Laterality Date   APPENDECTOMY  1959   BUNIONECTOMY     CATARACT EXTRACTION W/PHACO Left 03/27/2019   Procedure: CATARACT EXTRACTION PHACO AND INTRAOCULAR LENS PLACEMENT ([facility])  LEFT;  Surgeon: Nevada Crane, MD;  Location: [facility];  Service: Ophthalmology;  Laterality: Left;  IVA BLOCK   COCHLEAR IMPLANT     acute hearing loss   CRANIECTOMY FOR EXCISION OF ACOUSTIC NEUROMA  1994   craniotomy for meningoma      Family History  Problem Relation Age of Onset   Hypertension Mother    Heart disease Mother    Heart failure Mother    Cancer Father 88       prostate   Stroke Father    Heart disease Brother 43       CABG   Stroke Son    Colon cancer Neg Hx    Social History   Socioeconomic History   Marital status: Divorced    Spouse name: Not on file   Number of children: 3   Years of education: Not on file   Highest education level: Not on file  Occupational History   Not on file  Tobacco Use   Smoking status: Former    Current packs/day: 0.00    Average packs/day: 1.5 packs/day for 40.0 years (60.0 ttl pk-yrs)    Types: Cigarettes    Start date: 10/05/1960    Quit date: 10/05/2000    Years since quitting: 22.5   Smokeless tobacco: Former    Quit date: 1990  Vaping Use   Vaping status: Never Used  Substance and Sexual Activity   Alcohol use: Yes    Alcohol/week: 0.0 standard drinks of alcohol    Comment: occasional wine  Drug use: No   Sexual activity: Not on file  Other Topics Concern   Not on file  Social History Narrative   Not on file   Social Determinants of Health   Financial Resource Strain: Patient Declined (04/26/2023)   Overall Financial Resource Strain (CARDIA)    Difficulty of Paying Living Expenses: Patient declined  Food Insecurity: Patient Declined (04/26/2023)   Hunger Vital Sign    Worried About Running Out of Food in the Last Year: Patient declined    Ran Out of Food in the Last Year: Patient declined  Transportation Needs: Patient Declined (04/26/2023)   PRAPARE - Administrator, Civil Service (Medical): Patient declined    Lack of Transportation (Non-Medical): Patient declined  Physical Activity: Sufficiently Active (04/26/2023)   Exercise Vital Sign    Days of Exercise per Week: 5 days    Minutes of Exercise per Session: 40 min  Stress: Patient Declined (04/26/2023)   [employer] of Occupational Health - Occupational Stress  Questionnaire    Feeling of Stress : Patient declined  Social Connections: Unknown (04/26/2023)   Social Connection and Isolation Panel [NHANES]    Frequency of Communication with Friends and Family: Patient declined    Frequency of Social Gatherings with Friends and Family: Patient declined    Attends Religious Services: Patient declined    Database administrator or Organizations: Patient declined    Attends Engineer, structural: More than 4 times per year    Marital Status: Patient declined     Review of Systems  Constitutional:  Negative for appetite change and unexpected weight change.  HENT:  Negative for congestion and sinus pressure.   Respiratory:  Negative for cough, chest tightness and shortness of breath.   Cardiovascular:  Negative for chest pain, palpitations and leg swelling.  Gastrointestinal:  Negative for abdominal pain, diarrhea, nausea and vomiting.  Genitourinary:  Negative for difficulty urinating and dysuria.  Musculoskeletal:  Negative for joint swelling and myalgias.  Skin:  Negative for color change and rash.  Neurological:  Negative for dizziness and headaches.  Psychiatric/Behavioral:  Negative for agitation and dysphoric mood.        Objective:     BP 128/74   Pulse 64   Temp 97.9 F (36.6 C)   Resp 16   Ht 5\' 8"  (1.727 m)   Wt 179 lb (81.2 kg)   SpO2 98%   BMI 27.22 kg/m  Wt Readings from Last 3 Encounters:  04/28/23 179 lb (81.2 kg)  10/26/22 175 lb (79.4 kg)  10/19/22 172 lb (78 kg)    Physical Exam Constitutional:      General: He is not in acute distress.    Appearance: Normal appearance. He is well-developed.  HENT:     Head: Normocephalic and atraumatic.     Right Ear: External ear normal.     Left Ear: External ear normal.  Eyes:     General: No scleral icterus.       Right eye: No discharge.        Left eye: No discharge.  Cardiovascular:     Rate and Rhythm: Normal rate and regular rhythm.     Comments: 1-2/6  systolic murmur.  Pulmonary:     Effort: Pulmonary effort is normal. No respiratory distress.     Breath sounds: Normal breath sounds.  Abdominal:     General: Bowel sounds are normal.     Palpations: Abdomen is soft.  Tenderness: There is no abdominal tenderness.  Musculoskeletal:        General: No swelling or tenderness.     Cervical back: Neck supple. No tenderness.  Lymphadenopathy:     Cervical: No cervical adenopathy.  Skin:    Findings: No erythema or rash.  Neurological:     Mental Status: He is alert.  Psychiatric:        Mood and Affect: Mood normal.        Behavior: Behavior normal.      Outpatient Encounter Medications as of 04/28/2023  Medication Sig   aspirin EC 81 MG tablet Take 81 mg by mouth daily.   cyanocobalamin (VITAMIN B12) 1000 MCG/ML injection Inject 1 mL (1,000 mcg total) into the muscle every 30 (thirty) days.   fluticasone (FLONASE) 50 MCG/ACT nasal spray SPRAY TWO SPRAYS IN EACH NOSTRIL ONCE DAILY   Multiple Vitamins-Minerals (VISION-VITE PRESERVE PO) Take by mouth.   SYRINGE-NEEDLE, DISP, 3 ML 25G X 1" 3 ML MISC Use to inject b12 into muscle every 30 days   timolol (TIMOPTIC) 0.5 % ophthalmic solution Place 1 drop into both eyes daily.   [DISCONTINUED] azithromycin (ZITHROMAX) 250 MG tablet Take 1 tablet (250 mg total) by mouth daily. Take first 2 tablets together, then 1 every day until finished.   No facility-administered encounter medications on file as of 04/28/2023.     Lab Results  Component Value Date   WBC 4.9 04/28/2023   HGB 15.5 04/28/2023   HCT 47.7 04/28/2023   PLT 155.0 04/28/2023   GLUCOSE 95 04/28/2023   CHOL 212 (H) 04/28/2023   TRIG 319.0 (H) 04/28/2023   HDL 35.00 (L) 04/28/2023   LDLDIRECT 132.0 04/28/2023   LDLCALC 141 (H) 01/20/2018   ALT 26 04/28/2023   AST 23 04/28/2023   NA 139 04/28/2023   K 4.6 04/28/2023   CL 104 04/28/2023   CREATININE 0.99 04/28/2023   BUN 27 (H) 04/28/2023   CO2 28 04/28/2023   TSH  2.83 04/28/2023   PSA 0.43 04/16/2021    No results found.     Assessment & Plan:  Hypercholesterolemia Assessment & Plan: Intolerant to crestor and lipitor.  Low cholesterol diet and exercise.  Follow lipid panel. Recheck today.   Orders: -     Basic metabolic panel -     Hepatic function panel -     Lipid panel -     TSH  Thrombocytopenia (HCC) Assessment & Plan: Follow cbc.  Recheck today.   Orders: -     CBC with Differential/Platelet  Hematuria, unspecified type Assessment & Plan: Had two episodes of noticing blood in urine.  Denies pain.  No blood now.  Will check urine.  Discussed - CT (renal stone) and referral to urology for further evaluation - hematuria.   Orders: -     Urinalysis, Routine w reflex microscopic  Right-sided carotid artery disease, unspecified type [facility]) Assessment & Plan: Saw [facility] 10/2022 - Duplex ultrasound of the carotid arteries demonstrates RICA 1-39% and LICA 1-39%.  No significant change compared with previous study.  Recommended f/u in 24 months.  Declines cholesterol medication.  Continue aspirin daily.    History of colonic polyps Assessment & Plan: Colonoscopy 2012 with polyp removed.  GI had recommended f/u in 3-5 years.  Have discussed f/u colonoscopy with him.  He has declined and continues to decline.  Notify me if changes mind.     History of meningioma Assessment & Plan: S/p removal.  Was  followed by neurosurgery.  No f/u warranted.  Doing well.    Murmur Assessment & Plan: 1-2/6 systolic murmur.  Previous ECHO - mild MR.  Currently without symptoms.  Follow.    Tick bite of abdomen, initial encounter Assessment & Plan: Occurred beginning of June.  No fever.  No headache.  No rash.  No residual problems.     Other orders -     LDL cholesterol, direct     Dale , MD

## 2023-04-30 ENCOUNTER — Telehealth: Payer: Self-pay

## 2023-04-30 NOTE — Telephone Encounter (Signed)
-----   Message from [place] sent at 04/29/2023  4:22 AM EDT ----- Notify Benjamin Carney that his cholesterol remains elevated.  Triglycerides have increased.  He has had intolerance to statin medication previously.  If agreeable, can try repatha - injectable cholesterol medication (not a statin).  I can talk with him more about this if desires.  Just let me know.  Send [facility] lipid diet.  If declines medication, recommend continuing low carb diet and exercise.  Thyroid test and liver function tests are wnl.  Hgb and platelet count wnl. Urine is negative for blood.  Given he has had two episodes of hematuria and question of passage of kidney stone, I would like to obtain a CT scan (renal stone study) - to further evaluate.  If desires to hold on scan, I would like to refer him to urology for further w/up and evaluation.  (I can order scan if agrees).

## 2023-05-02 ENCOUNTER — Encounter: Payer: Self-pay | Admitting: Internal Medicine

## 2023-05-02 DIAGNOSIS — R011 Cardiac murmur, unspecified: Secondary | ICD-10-CM | POA: Insufficient documentation

## 2023-05-02 DIAGNOSIS — S30861A Insect bite (nonvenomous) of abdominal wall, initial encounter: Secondary | ICD-10-CM | POA: Insufficient documentation

## 2023-05-02 NOTE — Assessment & Plan Note (Signed)
Follow cbc.  Recheck today.   

## 2023-05-02 NOTE — Assessment & Plan Note (Signed)
Saw [facility] 10/2022 - Duplex ultrasound of the carotid arteries demonstrates RICA 1-39% and LICA 1-39%.  No significant change compared with previous study.  Recommended f/u in 24 months.  Declines cholesterol medication.  Continue aspirin daily.

## 2023-05-02 NOTE — Assessment & Plan Note (Signed)
S/p removal.  Was followed by neurosurgery.  No f/u warranted.  Doing well.  

## 2023-05-02 NOTE — Assessment & Plan Note (Signed)
Had two episodes of noticing blood in urine.  Denies pain.  No blood now.  Will check urine.  Discussed - CT (renal stone) and referral to urology for further evaluation - hematuria.

## 2023-05-02 NOTE — Assessment & Plan Note (Signed)
Intolerant to crestor and lipitor.  Low cholesterol diet and exercise.  Follow lipid panel. Recheck today.

## 2023-05-02 NOTE — Assessment & Plan Note (Signed)
Colonoscopy 2012 with polyp removed.  GI had recommended f/u in 3-5 years.  Have discussed f/u colonoscopy with him.  He has declined and continues to decline.  Notify me if changes mind.   

## 2023-05-02 NOTE — Assessment & Plan Note (Signed)
1-2/6 systolic murmur.  Previous ECHO - mild MR.  Currently without symptoms.  Follow.

## 2023-05-02 NOTE — Assessment & Plan Note (Signed)
Occurred beginning of June.  No fever.  No headache.  No rash.  No residual problems.

## 2023-05-24 ENCOUNTER — Other Ambulatory Visit: Payer: Self-pay

## 2023-05-24 MED ORDER — FLUTICASONE PROPIONATE 50 MCG/ACT NA SUSP: 2.0000 | Freq: Every day | NASAL | 1 refills | Status: DC

## 2023-08-01 ENCOUNTER — Other Ambulatory Visit: Payer: Self-pay | Admitting: Internal Medicine

## 2023-08-01 DIAGNOSIS — E538 Deficiency of other specified B group vitamins: Secondary | ICD-10-CM

## 2023-08-02 ENCOUNTER — Ambulatory Visit (INDEPENDENT_AMBULATORY_CARE_PROVIDER_SITE_OTHER): Payer: Medicare Other | Admitting: *Deleted

## 2023-08-02 VITALS — Ht 68.0 in | Wt 180.0 lb

## 2023-08-02 DIAGNOSIS — Z0000 Encounter for general adult medical examination without abnormal findings: Secondary | ICD-10-CM

## 2023-08-02 NOTE — Patient Instructions (Signed)
Benjamin Carney , Thank you for taking time to come for your Medicare Wellness Visit. I appreciate your ongoing commitment to your health goals. Please review the following plan we discussed and let me know if I can assist you in the future.   Referrals/Orders/Follow-Ups/Clinician Recommendations: Remember to get your Tetanus and Flu vaccines  This is a list of the screening recommended for you and due dates:  Health Maintenance  Topic Date Due   Flu Shot  05/06/2023   COVID-19 Vaccine (3 - 2023-24 season) 06/06/2023   Medicare Annual Wellness Visit  08/01/2024   Pneumonia Vaccine  Completed   Zoster (Shingles) Vaccine  Completed   HPV Vaccine  Aged Out   DTaP/Tdap/Td vaccine  Discontinued    Advanced directives: (Copy Requested) Please bring a copy of your health care power of attorney and living will to the office to be added to your chart at your convenience.  Next Medicare Annual Wellness Visit scheduled for next year: Yes 08/07/24 @ 8:55

## 2023-08-02 NOTE — Progress Notes (Signed)
Subjective:   Benjamin Carney is a 88 y.o. male who presents for Medicare Annual/Subsequent preventive examination.  Visit Complete: Virtual I connected with  Benjamin Carney on 08/02/23 by a audio enabled telemedicine application and verified that I am speaking with the correct person using two identifiers.  Patient Location: Home  Provider Location: Office/Clinic  I discussed the limitations of evaluation and management by telemedicine. The patient expressed understanding and agreed to proceed.  Vital Signs: Because this visit was a virtual/telehealth visit, some criteria may be missing or patient reported. Any vitals not documented were not able to be obtained and vitals that have been documented are patient reported.  Patient Medicare AWV questionnaire was completed by the patient on 07/30/23; I have confirmed that all information answered by patient is correct and no changes since this date.  Cardiac Risk Factors include: advanced age (>80men, >63 women);male gender;dyslipidemia;Other (see comment), Risk factor comments: CAD     Objective:    Today's Vitals   08/02/23 0853  Weight: 180 lb (81.6 kg)  Height: 5\' 8"  (1.727 m)   Body mass index is 27.37 kg/m.     08/02/2023    9:10 AM 12/08/2022   12:27 PM 07/29/2022    8:42 AM 07/18/2021    8:34 AM 07/17/2019    8:46 AM 03/27/2019    7:29 AM 07/11/2018    8:38 AM  Advanced Directives  Does Patient Have a Medical Advance Directive? Yes Yes Yes Yes Yes Yes Yes  Type of Estate agent of [place];Living will Healthcare Power of [place];Living will Healthcare Power of [place];Living will Healthcare Power of [place];Living will Healthcare Power of [place];Living will Healthcare Power of [place];Living will Healthcare Power of [place];Living will  Does patient want to make changes to medical advance directive?   No - Patient declined No - Patient declined No - Patient declined No - Patient declined No -  Patient declined  Copy of Healthcare Power of Attorney in Chart? No - copy requested  No - copy requested No - copy requested No - copy requested Yes - validated most recent copy scanned in chart (See row information) No - copy requested    Current Medications (verified) Outpatient Encounter Medications as of 08/02/2023  Medication Sig   aspirin EC 81 MG tablet Take 81 mg by mouth daily.   cyanocobalamin (VITAMIN B12) 1000 MCG/ML injection Inject 1 mL (1,000 mcg total) into the muscle every 30 (thirty) days.   fluticasone (FLONASE) 50 MCG/ACT nasal spray Place 2 sprays into both nostrils daily.   Multiple Vitamins-Minerals (VISION-VITE PRESERVE PO) Take by mouth.   SYRINGE-NEEDLE, DISP, 3 ML 25G X 1" 3 ML MISC Use to inject b12 into muscle every 30 days   timolol (TIMOPTIC) 0.5 % ophthalmic solution Place 1 drop into both eyes daily.   No facility-administered encounter medications on file as of 08/02/2023.    Allergies (verified) Crestor [rosuvastatin calcium], Levonorgestrel-ethinyl estrad, and Metronidazole   History: Past Medical History:  Diagnosis Date   Acoustic neuroma (HCC)    Arthritis    hands   Cochlear implant status    bilateral   Complication of anesthesia    difficult intubation for one of his "head surgeries at [facility]" per pt.   History of colonic polyps    History of craniotomy    Hypercholesterolemia    Macular degeneration    Psoriasis    Rheumatic fever age 75   Rosacea    Scarlet fever age 30  Vertigo    Wears dentures    full upper, partial lower   Past Surgical History:  Procedure Laterality Date   APPENDECTOMY  10/05/1957   BUNIONECTOMY     CATARACT EXTRACTION W/PHACO Left 03/27/2019   Procedure: CATARACT EXTRACTION PHACO AND INTRAOCULAR LENS PLACEMENT (IOC)  LEFT;  Surgeon: Nevada Crane, MD;  Location: [facility];  Service: Ophthalmology;  Laterality: Left;  IVA BLOCK   COCHLEAR IMPLANT     acute hearing loss   CRANIECTOMY FOR  EXCISION OF ACOUSTIC NEUROMA  10/05/1992   craniotomy for meningoma     skin cancer removed from face  2024   Family History  Problem Relation Age of Onset   Hypertension Mother    Heart disease Mother    Heart failure Mother    Cancer Father 25       prostate   Stroke Father    Heart disease Brother 62       CABG   Stroke Son    Colon cancer Neg Hx    Social History   Socioeconomic History   Marital status: Divorced    Spouse name: Not on file   Number of children: 3   Years of education: Not on file   Highest education level: Not on file  Occupational History   Not on file  Tobacco Use   Smoking status: Former    Current packs/day: 0.00    Average packs/day: 1.5 packs/day for 40.0 years (60.0 ttl pk-yrs)    Types: Cigarettes    Start date: 10/05/1960    Quit date: 10/05/2000    Years since quitting: 22.8   Smokeless tobacco: Former    Quit date: 1990  Vaping Use   Vaping status: Never Used  Substance and Sexual Activity   Alcohol use: Yes    Alcohol/week: 0.0 standard drinks of alcohol    Comment: occasional wine   Drug use: No   Sexual activity: Not on file  Other Topics Concern   Not on file  Social History Narrative   Divorced   Social Determinants of Health   Financial Resource Strain: Medium Risk (07/30/2023)   Overall Financial Resource Strain (CARDIA)    Difficulty of Paying Living Expenses: Somewhat hard  Food Insecurity: No Food Insecurity (07/30/2023)   Hunger Vital Sign    Worried About Running Out of Food in the Last Year: Never true    Ran Out of Food in the Last Year: Never true  Transportation Needs: No Transportation Needs (07/30/2023)   PRAPARE - Administrator, Civil Service (Medical): No    Lack of Transportation (Non-Medical): No  Physical Activity: Sufficiently Active (07/30/2023)   Exercise Vital Sign    Days of Exercise per Week: 7 days    Minutes of Exercise per Session: 40 min  Stress: No Stress Concern Present  (07/30/2023)   [employer] of Occupational Health - Occupational Stress Questionnaire    Feeling of Stress : Only a little  Social Connections: Moderately Integrated (07/30/2023)   Social Connection and Isolation Panel [NHANES]    Frequency of Communication with Friends and Family: More than three times a week    Frequency of Social Gatherings with Friends and Family: More than three times a week    Attends Religious Services: More than 4 times per year    Active Member of [employer] or Organizations: Yes    Attends Banker Meetings: More than 4 times per year    Marital  Status: Divorced    Tobacco Counseling Counseling given: Not Answered   Clinical Intake:  Pre-visit preparation completed: Yes  Pain : No/denies pain     BMI - recorded: 27.37 Nutritional Status: BMI 25 -29 Overweight Nutritional Risks: None Diabetes: No  How often do you need to have someone help you when you read instructions, pamphlets, or other written materials from your doctor or pharmacy?: 1 - Never  Interpreter Needed?: No  Information entered by :: R. Jadis Mika LPN   Activities of Daily Living    07/30/2023    8:19 AM  In your present state of health, do you have any difficulty performing the following activities:  Hearing? 1  Comment implants  Vision? 0  Comment glasses  Difficulty concentrating or making decisions? 0  Walking or climbing stairs? 0  Dressing or bathing? 0  Doing errands, shopping? 0  Preparing Food and eating ? N  Using the Toilet? N  In the past six months, have you accidently leaked urine? N  Do you have problems with loss of bowel control? N  Managing your Medications? N  Managing your Finances? N  Housekeeping or managing your Housekeeping? N    Patient Care Team: Dale [place], MD as PCP - General (Internal Medicine)  Indicate any recent Medical Services you may have received from other than [facility] providers in the past year (date may be  approximate).     Assessment:   This is a routine wellness examination for [place].  Hearing/Vision screen Hearing Screening - Comments:: Has implants Vision Screening - Comments:: glasses   Goals Addressed             This Visit's Progress    Patient Stated       Continue to play golf       Depression Screen    08/02/2023    9:05 AM 10/19/2022   10:30 AM 07/29/2022    8:41 AM 04/09/2022    7:11 AM 07/18/2021    8:32 AM 04/16/2021    7:37 AM 07/17/2020    8:43 AM  PHQ 2/9 Scores  PHQ - 2 Score 0 0 0 0 0 0 0  PHQ- 9 Score 4 2         Fall Risk    07/30/2023    8:19 AM 10/19/2022   10:29 AM 07/29/2022    8:41 AM 07/25/2022   10:42 AM 04/09/2022    7:10 AM  Fall Risk   Falls in the past year? 0 0 0 0 0  Number falls in past yr: 0 0 0 0 0  Injury with Fall? 0 0  0 0  Risk for fall due to : No Fall Risks No Fall Risks   History of fall(s)  Follow up Falls prevention discussed;Falls evaluation completed Falls evaluation completed Falls evaluation completed  Falls evaluation completed    MEDICARE RISK AT HOME: Medicare Risk at Home Any stairs in or around the home?: Yes If so, are there any without handrails?: Yes Home free of loose throw rugs in walkways, pet beds, electrical cords, etc?: No Adequate lighting in your home to reduce risk of falls?: Yes Life alert?: No Use of a cane, walker or w/c?: No Grab bars in the bathroom?: No Shower chair or bench in shower?: No Elevated toilet seat or a handicapped toilet?: No     Cognitive Function:    07/11/2018    8:45 AM  MMSE - Mini Mental State Exam  Orientation to time  5  Orientation to Place 5  Registration 3  Attention/ Calculation 5  Recall 3  Language- name 2 objects 2  Language- repeat 1  Language- follow 3 step command 3  Language- read & follow direction 1  Write a sentence 1  Copy design 1  Total score 30        08/02/2023    9:12 AM 07/29/2022    8:46 AM 07/17/2019    8:54 AM 07/08/2017     4:23 PM  6CIT Screen  What Year? 0 points 0 points 0 points 0 points  What month? 0 points 0 points 0 points 0 points  What time? 0 points 0 points 0 points 0 points  Count back from 20 0 points 0 points 0 points 0 points  Months in reverse 0 points 0 points 0 points 0 points  Repeat phrase 0 points 0 points 0 points 0 points  Total Score 0 points 0 points 0 points 0 points    Immunizations Immunization History  Administered Date(s) Administered   Fluad Quad(high Dose 65+) 06/16/2019   Influenza Split 09/11/2011, 08/15/2012   Influenza, High Dose Seasonal PF 07/21/2017, 07/14/2018   Influenza,inj,Quad PF,6+ Mos 08/25/2013, 10/10/2014, 09/03/2015, 07/29/2016   PFIZER(Purple Top)SARS-COV-2 Vaccination 10/12/2019, 11/04/2019   Pneumococcal Conjugate-13 12/07/2013   Pneumococcal Polysaccharide-23 06/05/2009   Zoster Recombinant(Shingrix) 11/04/2021, 01/27/2022   Zoster, Live 09/22/2010    TDAP status: Due, Education has been provided regarding the importance of this vaccine. Advised may receive this vaccine at local pharmacy or Health Dept. Aware to provide a copy of the vaccination record if obtained from local pharmacy or Health Dept. Verbalized acceptance and understanding.  Flu Vaccine status: Due, Education has been provided regarding the importance of this vaccine. Advised may receive this vaccine at local pharmacy or Health Dept. Aware to provide a copy of the vaccination record if obtained from local pharmacy or Health Dept. Verbalized acceptance and understanding.  Pneumococcal vaccine status: Up to date  Covid-19 vaccine status: Declined, Education has been provided regarding the importance of this vaccine but patient still declined. Advised may receive this vaccine at local pharmacy or Health Dept.or vaccine clinic. Aware to provide a copy of the vaccination record if obtained from local pharmacy or Health Dept. Verbalized acceptance and understanding.  Qualifies for  Shingles Vaccine? Yes   Zostavax completed Yes   Shingrix Completed?: Yes  Screening Tests Health Maintenance  Topic Date Due   INFLUENZA VACCINE  05/06/2023   COVID-19 Vaccine (3 - 2023-24 season) 06/06/2023   Medicare Annual Wellness (AWV)  07/30/2023   Pneumonia Vaccine 52+ Years old  Completed   Zoster Vaccines- Shingrix  Completed   HPV VACCINES  Aged Out   DTaP/Tdap/Td  Discontinued    Health Maintenance  Health Maintenance Due  Topic Date Due   INFLUENZA VACCINE  05/06/2023   COVID-19 Vaccine (3 - 2023-24 season) 06/06/2023   Medicare Annual Wellness (AWV)  07/30/2023    Colorectal cancer screening: No longer required.   Lung Cancer Screening: (Low Dose CT Chest recommended if Age 61-80 years, 20 pack-year currently smoking OR have quit w/in 15years.) does not qualify.     Additional Screening:  Hepatitis C Screening: does not qualify; Completed NA age  Vision Screening: Recommended annual ophthalmology exams for early detection of glaucoma and other disorders of the eye. Is the patient up to date with their annual eye exam?  Yes  Who is the provider or what is the name of the office  in which the patient attends annual eye exams? [facility] If pt is not established with a provider, would they like to be referred to a provider to establish care? No .   Dental Screening: Recommended annual dental exams for proper oral hygiene   Community Resource Referral / Chronic Care Management: CRR required this visit?  No   CCM required this visit?  No     Plan:     I have personally reviewed and noted the following in the patient's chart:   Medical and social history Use of alcohol, tobacco or illicit drugs  Current medications and supplements including opioid prescriptions. Patient is not currently taking opioid prescriptions. Functional ability and status Nutritional status Physical activity Advanced directives List of other physicians Hospitalizations,  surgeries, and ER visits in previous 12 months Vitals Screenings to include cognitive, depression, and falls Referrals and appointments  In addition, I have reviewed and discussed with patient certain preventive protocols, quality metrics, and best practice recommendations. A written personalized care plan for preventive services as well as general preventive health recommendations were provided to patient.     Sydell Axon, LPN   [id number]   After Visit Summary: (MyChart) Due to this being a telephonic visit, the after visit summary with patients personalized plan was offered to patient via MyChart   Nurse Notes: None

## 2023-08-18 ENCOUNTER — Encounter: Payer: Self-pay | Admitting: Internal Medicine

## 2023-08-18 DIAGNOSIS — R310 Gross hematuria: Secondary | ICD-10-CM

## 2023-08-18 NOTE — Telephone Encounter (Signed)
LMTCB

## 2023-08-18 NOTE — Telephone Encounter (Signed)
I am ok with doing urology referral, but need to clarify that he is doing ok.  Any other symptoms?  Any bleeding now?  Also clarify if a particular physician he is wanting to see.

## 2023-08-19 NOTE — Telephone Encounter (Signed)
Patient called back. He would prefer to go to [facility] in [place]. No other symptoms and blood has cleared but this is the 3rd episode so he is ready to see a urologist. Referral placed.

## 2023-10-12 ENCOUNTER — Encounter (INDEPENDENT_AMBULATORY_CARE_PROVIDER_SITE_OTHER): Payer: Self-pay | Admitting: Vascular Surgery

## 2023-11-01 ENCOUNTER — Encounter (INDEPENDENT_AMBULATORY_CARE_PROVIDER_SITE_OTHER): Payer: Medicare Other

## 2023-11-01 ENCOUNTER — Ambulatory Visit (INDEPENDENT_AMBULATORY_CARE_PROVIDER_SITE_OTHER): Payer: Medicare Other | Admitting: Vascular Surgery

## 2023-11-15 NOTE — Progress Notes (Signed)
Subjective:    [name], male    DOB: May 10, 1935, 89 y.o.   MRN: [id number]  Patient here for  Chief Complaint  Patient presents with   Annual Exam    HPI Here for a physical exam.   History of hypercholesterolemia and memory change. (intolerant to lipitor and crestor).  Saw [facility] 10/2022 - Duplex ultrasound of the carotid arteries demonstrates RICA 1-39% and LICA 1-39%.  No significant change compared with previous study.  Recommended f/u in 24 months. Previous hematuria. Saw urology 10/20/23 - CT urogram and cystoscopy recommended. Scheduled to have testing end of this month. Has continued to have some intermittent episodes of hematuria.  No abdominal pain. He is walking. No chest pain or sob with increased activity or exertion.    Past Medical History:  Diagnosis Date   Acoustic neuroma [facility])    Arthritis    hands   Cochlear implant status    bilateral   Complication of anesthesia    difficult intubation for one of his "head surgeries at [facility]" per pt.   History of colonic polyps    History of craniotomy    Hypercholesterolemia    Macular degeneration    Psoriasis    Rheumatic fever age 30   Rosacea    Scarlet fever age 54   Vertigo    Wears dentures    full upper, partial lower   Past Surgical History:  Procedure Laterality Date   APPENDECTOMY  10/05/1957   BUNIONECTOMY     CATARACT EXTRACTION W/PHACO Left 03/27/2019   Procedure: CATARACT EXTRACTION PHACO AND INTRAOCULAR LENS PLACEMENT (IOC)  LEFT;  Surgeon: Nevada Crane, MD;  Location: [facility];  Service: Ophthalmology;  Laterality: Left;  IVA BLOCK   COCHLEAR IMPLANT     acute hearing loss   CRANIECTOMY FOR EXCISION OF ACOUSTIC NEUROMA  10/05/1992   craniotomy for meningoma     skin cancer removed from face  2024   Family History  Problem Relation Age of Onset   Hypertension Mother    Heart disease Mother    Heart failure Mother    Cancer Father 57       prostate   Stroke  Father    Heart disease Brother 71       CABG   Stroke Son    Colon cancer Neg Hx    Social History   Socioeconomic History   Marital status: Divorced    Spouse name: Not on file   Number of children: 3   Years of education: Not on file   Highest education level: Some college, no degree  Occupational History   Not on file  Tobacco Use   Smoking status: Former    Current packs/day: 0.00    Average packs/day: 1.5 packs/day for 40.0 years (60.0 ttl pk-yrs)    Types: Cigarettes    Start date: 10/05/1960    Quit date: 10/05/2000    Years since quitting: 23.1   Smokeless tobacco: Former    Quit date: 1990  Vaping Use   Vaping status: Never Used  Substance and Sexual Activity   Alcohol use: Yes    Alcohol/week: 0.0 standard drinks of alcohol    Comment: occasional wine   Drug use: No   Sexual activity: Not on file  Other Topics Concern   Not on file  Social History Narrative   Divorced   Social Drivers of Health   Financial Resource Strain: Patient Declined (11/13/2023)  Overall Financial Resource Strain (CARDIA)    Difficulty of Paying Living Expenses: Patient declined  Food Insecurity: Patient Declined (11/13/2023)   Hunger Vital Sign    Worried About Running Out of Food in the Last Year: Patient declined    Ran Out of Food in the Last Year: Patient declined  Transportation Needs: Patient Declined (11/13/2023)   PRAPARE - Administrator, Civil Service (Medical): Patient declined    Lack of Transportation (Non-Medical): Patient declined  Physical Activity: Sufficiently Active (11/13/2023)   Exercise Vital Sign    Days of Exercise per Week: 5 days    Minutes of Exercise per Session: 40 min  Stress: Patient Declined (11/13/2023)   [employer] of Occupational Health - Occupational Stress Questionnaire    Feeling of Stress : Patient declined  Social Connections: Unknown (11/13/2023)   Social Connection and Isolation Panel [NHANES]    Frequency of Communication  with Friends and Family: Patient declined    Frequency of Social Gatherings with Friends and Family: Patient declined    Attends Religious Services: Patient declined    Database administrator or Organizations: Patient declined    Attends Engineer, structural: More than 4 times per year    Marital Status: Divorced     Review of Systems  Constitutional:  Negative for appetite change and unexpected weight change.  HENT:  Negative for congestion, sinus pressure and sore throat.   Eyes:  Negative for pain and visual disturbance.  Respiratory:  Negative for cough, chest tightness and shortness of breath.   Cardiovascular:  Negative for chest pain, palpitations and leg swelling.  Gastrointestinal:  Negative for abdominal pain, diarrhea, nausea and vomiting.  Genitourinary:  Positive for hematuria. Negative for difficulty urinating and dysuria.  Musculoskeletal:  Negative for joint swelling and myalgias.  Skin:  Negative for color change and rash.  Neurological:  Negative for dizziness and headaches.  Hematological:  Negative for adenopathy. Does not bruise/bleed easily.  Psychiatric/Behavioral:  Negative for agitation and dysphoric mood.        Objective:     BP 130/82   Pulse 60   Temp 97.7 F (36.5 C)   Ht 5\' 8"  (1.727 m)   Wt 179 lb 3.2 oz (81.3 kg)   SpO2 98%   BMI 27.25 kg/m  Wt Readings from Last 3 Encounters:  11/16/23 179 lb 3.2 oz (81.3 kg)  08/02/23 180 lb (81.6 kg)  04/28/23 179 lb (81.2 kg)    Physical Exam Constitutional:      General: He is not in acute distress.    Appearance: He is well-developed.  HENT:     Head: Normocephalic and atraumatic.     Mouth/Throat:     Pharynx: No oropharyngeal exudate or posterior oropharyngeal erythema.  Eyes:     General:        Right eye: No discharge.        Left eye: No discharge.     Conjunctiva/sclera: Conjunctivae normal.  Neck:     Thyroid: No thyromegaly.  Cardiovascular:     Rate and Rhythm: Normal  rate and regular rhythm.     Comments: 1-2/6 systolic murmur Pulmonary:     Effort: No respiratory distress.     Breath sounds: Normal breath sounds. No wheezing.  Abdominal:     General: Bowel sounds are normal.     Palpations: Abdomen is soft.     Tenderness: There is no abdominal tenderness.  Musculoskeletal:  General: No tenderness.     Cervical back: Neck supple.  Lymphadenopathy:     Cervical: No cervical adenopathy.  Skin:    General: Skin is warm and dry.     Findings: No rash.  Neurological:     Mental Status: He is alert and oriented to person, place, and time.  Psychiatric:        Behavior: Behavior normal.         Outpatient Encounter Medications as of 11/16/2023  Medication Sig   aspirin EC 81 MG tablet Take 81 mg by mouth daily.   cyanocobalamin (VITAMIN B12) 1000 MCG/ML injection INJECT INTO THE MUSCLE EVERY 30 DAYS   fluticasone (FLONASE) 50 MCG/ACT nasal spray Place 2 sprays into both nostrils daily.   Multiple Vitamins-Minerals (VISION-VITE PRESERVE PO) Take by mouth.   SYRINGE-NEEDLE, DISP, 3 ML 25G X 1" 3 ML MISC Use to inject b12 into muscle every 30 days   timolol (TIMOPTIC) 0.5 % ophthalmic solution Place 1 drop into both eyes daily.   No facility-administered encounter medications on file as of 11/16/2023.     Lab Results  Component Value Date   WBC 4.9 04/28/2023   HGB 15.5 04/28/2023   HCT 47.7 04/28/2023   PLT 155.0 04/28/2023   GLUCOSE 95 04/28/2023   CHOL 212 (H) 04/28/2023   TRIG 319.0 (H) 04/28/2023   HDL 35.00 (L) 04/28/2023   LDLDIRECT 132.0 04/28/2023   LDLCALC 141 (H) 01/20/2018   ALT 26 04/28/2023   AST 23 04/28/2023   NA 139 04/28/2023   K 4.6 04/28/2023   CL 104 04/28/2023   CREATININE 0.99 04/28/2023   BUN 27 (H) 04/28/2023   CO2 28 04/28/2023   TSH 2.83 04/28/2023   PSA 0.43 04/16/2021       Assessment & Plan:  Routine general medical examination at a health care  facility  Hypercholesterolemia Assessment & Plan: Intolerant to crestor and lipitor.  Low cholesterol diet and exercise.  Follow lipid panel. Recheck today. Continues to decline cholesterol medication.   Orders: -     CBC with Differential/Platelet -     Basic metabolic panel -     Hepatic function panel -     Lipid panel  Hematuria, unspecified type Assessment & Plan: Saw urology 10/20/23 - CT urogram and cystoscopy recommended. Scheduled to have testing end of this month. Has continued to have some intermittent episodes of hematuria.  No abdominal pain.  Orders: -     CBC with Differential/Platelet  Health care maintenance Assessment & Plan: Physical today 11/16/23. Colonoscopy 2012 - with polyps.  Recommended f/u in 3-5 years.  Discussed again today.  Declines.  PSA.  (04/16/21 - .43).  Urology ordered reccheck.    Right-sided carotid artery disease, unspecified type [facility]) Assessment & Plan: Saw [facility] 10/2022 - Duplex ultrasound of the carotid arteries demonstrates RICA 1-39% and LICA 1-39%.  No significant change compared with previous study.  Recommended f/u in 24 months per note review. Pt states was told did not need f/u.  Declines cholesterol medication.  Continue aspirin daily.    History of meningioma Assessment & Plan: S/p removal.  Was followed by neurosurgery.  No f/u warranted.  Doing well.    Murmur Assessment & Plan: 1-2/6 systolic murmur.  Previous ECHO - mild MR.  Currently without symptoms. Discussed f/u echo given murmur. Declines at this time.  Wants to monitor.    Thrombocytopenia (HCC) Assessment & Plan: Recheck cbc today.  [place], MD

## 2023-11-16 ENCOUNTER — Encounter: Payer: Self-pay | Admitting: Internal Medicine

## 2023-11-16 ENCOUNTER — Ambulatory Visit (INDEPENDENT_AMBULATORY_CARE_PROVIDER_SITE_OTHER): Payer: Medicare Other | Admitting: Internal Medicine

## 2023-11-16 VITALS — BP 130/82 | HR 60 | Temp 97.7°F | Ht 68.0 in | Wt 179.2 lb

## 2023-11-16 DIAGNOSIS — Z0000 Encounter for general adult medical examination without abnormal findings: Secondary | ICD-10-CM

## 2023-11-16 DIAGNOSIS — D696 Thrombocytopenia, unspecified: Secondary | ICD-10-CM

## 2023-11-16 DIAGNOSIS — R319 Hematuria, unspecified: Secondary | ICD-10-CM

## 2023-11-16 DIAGNOSIS — E7800 Pure hypercholesterolemia, unspecified: Secondary | ICD-10-CM | POA: Diagnosis not present

## 2023-11-16 DIAGNOSIS — R011 Cardiac murmur, unspecified: Secondary | ICD-10-CM

## 2023-11-16 DIAGNOSIS — I779 Disorder of arteries and arterioles, unspecified: Secondary | ICD-10-CM | POA: Diagnosis not present

## 2023-11-16 DIAGNOSIS — Z86018 Personal history of other benign neoplasm: Secondary | ICD-10-CM

## 2023-11-16 LAB — LIPID PANEL
Cholesterol: 207 mg/dL — ABNORMAL HIGH (ref 0–200)
HDL: 34.9 mg/dL — ABNORMAL LOW (ref >39.00)
LDL Cholesterol: 126 mg/dL — ABNORMAL HIGH (ref 0–99)
NonHDL: 172.3
Total CHOL/HDL Ratio: 6
Triglycerides: 232 mg/dL — ABNORMAL HIGH (ref 0.0–149.0)
VLDL: 46.4 mg/dL — ABNORMAL HIGH (ref 0.0–40.0)

## 2023-11-16 LAB — CBC WITH DIFFERENTIAL/PLATELET
Basophils Absolute: 0 10*3/uL (ref 0.0–0.1)
Basophils Relative: 0.4 % (ref 0.0–3.0)
Eosinophils Absolute: 0.2 10*3/uL (ref 0.0–0.7)
Eosinophils Relative: 3.4 % (ref 0.0–5.0)
HCT: 47.5 % (ref 39.0–52.0)
Hemoglobin: 15.7 g/dL (ref 13.0–17.0)
Lymphocytes Relative: 27.1 % (ref 12.0–46.0)
Lymphs Abs: 1.3 10*3/uL (ref 0.7–4.0)
MCHC: 33.1 g/dL (ref 30.0–36.0)
MCV: 93.7 fL (ref 78.0–100.0)
Monocytes Absolute: 0.4 10*3/uL (ref 0.1–1.0)
Monocytes Relative: 7.7 % (ref 3.0–12.0)
Neutro Abs: 2.8 10*3/uL (ref 1.4–7.7)
Neutrophils Relative %: 61.4 % (ref 43.0–77.0)
Platelets: 149 10*3/uL — ABNORMAL LOW (ref 150.0–400.0)
RBC: 5.07 Mil/uL (ref 4.22–5.81)
RDW: 14.4 % (ref 11.5–15.5)
WBC: 4.6 10*3/uL (ref 4.0–10.5)

## 2023-11-16 LAB — BASIC METABOLIC PANEL
BUN: 24 mg/dL — ABNORMAL HIGH (ref 6–23)
CO2: 29 meq/L (ref 19–32)
Calcium: 9.3 mg/dL (ref 8.4–10.5)
Chloride: 105 meq/L (ref 96–112)
Creatinine, Ser: 0.98 mg/dL (ref 0.40–1.50)
GFR: 69.91 mL/min (ref >60.00)
Glucose, Bld: 89 mg/dL (ref 70–99)
Potassium: 4 meq/L (ref 3.5–5.1)
Sodium: 141 meq/L (ref 135–145)

## 2023-11-16 LAB — HEPATIC FUNCTION PANEL
ALT: 22 U/L (ref 0–53)
AST: 20 U/L (ref 0–37)
Albumin: 4.3 g/dL (ref 3.5–5.2)
Alkaline Phosphatase: 79 U/L (ref 39–117)
Bilirubin, Direct: 0.2 mg/dL (ref 0.0–0.3)
Total Bilirubin: 1.1 mg/dL (ref 0.2–1.2)
Total Protein: 6.9 g/dL (ref 6.0–8.3)

## 2023-11-16 NOTE — Assessment & Plan Note (Signed)
S/p removal.  Was followed by neurosurgery.  No f/u warranted.  Doing well.

## 2023-11-16 NOTE — Assessment & Plan Note (Signed)
Saw [facility] 10/2022 - Duplex ultrasound of the carotid arteries demonstrates RICA 1-39% and LICA 1-39%.  No significant change compared with previous study.  Recommended f/u in 24 months per note review. Pt states was told did not need f/u.  Declines cholesterol medication.  Continue aspirin daily.

## 2023-11-16 NOTE — Assessment & Plan Note (Signed)
Intolerant to crestor and lipitor.  Low cholesterol diet and exercise.  Follow lipid panel. Recheck today. Continues to decline cholesterol medication.

## 2023-11-16 NOTE — Assessment & Plan Note (Signed)
Recheck cbc today.

## 2023-11-16 NOTE — Assessment & Plan Note (Signed)
Physical today 11/16/23. Colonoscopy 2012 - with polyps.  Recommended f/u in 3-5 years.  Discussed again today.  Declines.  PSA.  (04/16/21 - .43).  Urology ordered reccheck.

## 2023-11-16 NOTE — Assessment & Plan Note (Signed)
1-2/6 systolic murmur.  Previous ECHO - mild MR.  Currently without symptoms. Discussed f/u echo given murmur. Declines at this time.  Wants to monitor.

## 2023-11-16 NOTE — Assessment & Plan Note (Signed)
Saw urology 10/20/23 - CT urogram and cystoscopy recommended. Scheduled to have testing end of this month. Has continued to have some intermittent episodes of hematuria.  No abdominal pain.

## 2023-11-17 ENCOUNTER — Telehealth: Payer: Self-pay

## 2023-11-17 NOTE — Telephone Encounter (Signed)
Copied from CRM [id number]. Topic: Clinical - Lab/Test Results >> Nov 17, 2023  4:17 PM Isabell A wrote: Reason for CRM: Patient is requesting a call back from [place] - advised him of his results & asked if he would like to speak with a nurse for additional questions - he only wanted to speak to [place].

## 2023-11-17 NOTE — Telephone Encounter (Signed)
See result note.

## 2024-01-04 HISTORY — PX: OTHER SURGICAL HISTORY: SHX169

## 2024-01-26 ENCOUNTER — Encounter: Payer: Self-pay | Admitting: Internal Medicine

## 2024-01-26 NOTE — Telephone Encounter (Signed)
 Called Benjamin Carney. Discussed. States he is getting stronger. Does not feel needs anything at this time.

## 2024-02-14 ENCOUNTER — Encounter: Payer: Self-pay | Admitting: Internal Medicine

## 2024-02-22 ENCOUNTER — Encounter (INDEPENDENT_AMBULATORY_CARE_PROVIDER_SITE_OTHER): Payer: Self-pay

## 2024-03-03 ENCOUNTER — Other Ambulatory Visit: Payer: Self-pay | Admitting: Internal Medicine

## 2024-03-03 DIAGNOSIS — E538 Deficiency of other specified B group vitamins: Secondary | ICD-10-CM

## 2024-04-28 ENCOUNTER — Encounter: Payer: Self-pay | Admitting: Internal Medicine

## 2024-04-28 DIAGNOSIS — Z9621 Cochlear implant status: Secondary | ICD-10-CM | POA: Insufficient documentation

## 2024-04-28 DIAGNOSIS — D333 Benign neoplasm of cranial nerves: Secondary | ICD-10-CM | POA: Insufficient documentation

## 2024-05-16 ENCOUNTER — Encounter: Payer: Self-pay | Admitting: Internal Medicine

## 2024-05-16 ENCOUNTER — Ambulatory Visit: Payer: Medicare Other | Admitting: Internal Medicine

## 2024-05-16 VITALS — BP 118/64 | HR 70 | Temp 98.0°F | Ht 68.0 in | Wt 181.8 lb

## 2024-05-16 DIAGNOSIS — C679 Malignant neoplasm of bladder, unspecified: Secondary | ICD-10-CM

## 2024-05-16 DIAGNOSIS — Z86018 Personal history of other benign neoplasm: Secondary | ICD-10-CM

## 2024-05-16 DIAGNOSIS — I779 Disorder of arteries and arterioles, unspecified: Secondary | ICD-10-CM

## 2024-05-16 DIAGNOSIS — E7800 Pure hypercholesterolemia, unspecified: Secondary | ICD-10-CM

## 2024-05-16 DIAGNOSIS — D696 Thrombocytopenia, unspecified: Secondary | ICD-10-CM | POA: Diagnosis not present

## 2024-05-16 DIAGNOSIS — R011 Cardiac murmur, unspecified: Secondary | ICD-10-CM

## 2024-05-16 LAB — LIPID PANEL
Cholesterol: 228 mg/dL — ABNORMAL HIGH (ref 0–200)
HDL: 34.2 mg/dL — ABNORMAL LOW (ref >39.00)
LDL Cholesterol: 133 mg/dL — ABNORMAL HIGH (ref 0–99)
NonHDL: 193.63
Total CHOL/HDL Ratio: 7
Triglycerides: 303 mg/dL — ABNORMAL HIGH (ref 0.0–149.0)
VLDL: 60.6 mg/dL — ABNORMAL HIGH (ref 0.0–40.0)

## 2024-05-16 LAB — HEPATIC FUNCTION PANEL
ALT: 23 U/L (ref 0–53)
AST: 20 U/L (ref 0–37)
Albumin: 4.4 g/dL (ref 3.5–5.2)
Alkaline Phosphatase: 85 U/L (ref 39–117)
Bilirubin, Direct: 0.2 mg/dL (ref 0.0–0.3)
Total Bilirubin: 1 mg/dL (ref 0.2–1.2)
Total Protein: 6.7 g/dL (ref 6.0–8.3)

## 2024-05-16 LAB — BASIC METABOLIC PANEL WITH GFR
BUN: 14 mg/dL (ref 6–23)
CO2: 30 meq/L (ref 19–32)
Calcium: 9.3 mg/dL (ref 8.4–10.5)
Chloride: 102 meq/L (ref 96–112)
Creatinine, Ser: 0.86 mg/dL (ref 0.40–1.50)
GFR: 78.23 mL/min (ref >60.00)
Glucose, Bld: 85 mg/dL (ref 70–99)
Potassium: 4.3 meq/L (ref 3.5–5.1)
Sodium: 139 meq/L (ref 135–145)

## 2024-05-16 LAB — TSH: TSH: 4.14 u[IU]/mL (ref 0.35–5.50)

## 2024-05-16 MED ORDER — AMLODIPINE BESYLATE 2.5 MG PO TABS: 2.5000 mg | ORAL_TABLET | Freq: Every day | ORAL | 2 refills | Status: DC

## 2024-05-16 NOTE — Progress Notes (Signed)
 Subjective:    [name], male    DOB: 06-23-1935, 89 y.o.   MRN: [id number]  Patient here for  Chief Complaint  Patient presents with   Medical Management of Chronic Issues    6 mo f/u    HPI Here for a scheduled follow up - follow up regarding hypercholesterolemia. Had f/u 04/28/24 [facility] - cochlear implant malfunction. In the process of getting a new processor. Recommended f/u CT. Had urology f/u 05/09/24 - cystoscopy - no papillary tumor. Patch of erythema. Pathology - negative. Monthly Gem/Doce maintenance and repeat cystoscopy in 3 months. (Monthly treatments for two years). Does notice increased weakness and dizziness with the treatments. Lasts for about 5 days and then resolves. No chest pain reported. Breathing stable. No abdominal pain. Blood pressure running high since his surgery - 150-160s systolic readings.    Past Medical History:  Diagnosis Date   Acoustic neuroma [facility])    Arthritis    hands   Cochlear implant status    bilateral   Complication of anesthesia    difficult intubation for one of his head surgeries at [facility] per pt.   History of colonic polyps    History of craniotomy    Hypercholesterolemia    Macular degeneration    Psoriasis    Rheumatic fever age 36   Rosacea    Scarlet fever age 57   Vertigo    Wears dentures    full upper, partial lower   Past Surgical History:  Procedure Laterality Date   APPENDECTOMY  10/05/1957   BUNIONECTOMY     CATARACT EXTRACTION W/PHACO Left 03/27/2019   Procedure: CATARACT EXTRACTION PHACO AND INTRAOCULAR LENS PLACEMENT (IOC)  LEFT;  Surgeon: Myrna Adine Anes, MD;  Location: [facility];  Service: Ophthalmology;  Laterality: Left;  IVA BLOCK   COCHLEAR IMPLANT     acute hearing loss   CRANIECTOMY FOR EXCISION OF ACOUSTIC NEUROMA  10/05/1992   craniotomy for meningoma     skin cancer removed from face  2024   Family History  Problem Relation Age of Onset   Hypertension Mother     Heart disease Mother    Heart failure Mother    Cancer Father 3       prostate   Stroke Father    Heart disease Brother 90       CABG   Stroke Son    Colon cancer Neg Hx    Social History   Socioeconomic History   Marital status: Divorced    Spouse name: Not on file   Number of children: 3   Years of education: Not on file   Highest education level: Some college, no degree  Occupational History   Not on file  Tobacco Use   Smoking status: Former    Current packs/day: 0.00    Average packs/day: 1.5 packs/day for 40.0 years (60.0 ttl pk-yrs)    Types: Cigarettes    Start date: 10/05/1960    Quit date: 10/05/2000    Years since quitting: 23.6   Smokeless tobacco: Former    Quit date: 1990  Vaping Use   Vaping status: Never Used  Substance and Sexual Activity   Alcohol use: Yes    Alcohol/week: 0.0 standard drinks of alcohol    Comment: occasional wine   Drug use: No   Sexual activity: Not on file  Other Topics Concern   Not on file  Social History Narrative   Divorced  Social Drivers of Corporate investment banker Strain: Low Risk  (05/12/2024)   Overall Financial Resource Strain (CARDIA)    Difficulty of Paying Living Expenses: Not very hard  Food Insecurity: No Food Insecurity (05/12/2024)   Hunger Vital Sign    Worried About Running Out of Food in the Last Year: Never true    Ran Out of Food in the Last Year: Never true  Transportation Needs: No Transportation Needs (05/12/2024)   PRAPARE - Administrator, Civil Service (Medical): No    Lack of Transportation (Non-Medical): No  Physical Activity: Sufficiently Active (05/12/2024)   Exercise Vital Sign    Days of Exercise per Week: 4 days    Minutes of Exercise per Session: 40 min  Stress: No Stress Concern Present (05/12/2024)   [employer] of Occupational Health - Occupational Stress Questionnaire    Feeling of Stress: Not at all  Social Connections: Moderately Integrated (05/12/2024)   Social  Connection and Isolation Panel    Frequency of Communication with Friends and Family: More than three times a week    Frequency of Social Gatherings with Friends and Family: More than three times a week    Attends Religious Services: More than 4 times per year    Active Member of [employer] or Organizations: Yes    Attends Engineer, structural: More than 4 times per year    Marital Status: Divorced     Review of Systems  Constitutional:  Negative for appetite change and unexpected weight change.  HENT:  Negative for congestion and sinus pressure.   Respiratory:  Negative for cough and chest tightness.        Breathing stable.   Cardiovascular:  Negative for chest pain, palpitations and leg swelling.  Gastrointestinal:  Negative for abdominal pain, diarrhea, nausea and vomiting.  Genitourinary:  Negative for difficulty urinating and dysuria.  Musculoskeletal:  Negative for joint swelling and myalgias.  Skin:  Negative for color change and rash.  Neurological:  Negative for headaches.       No dizziness currently.   Psychiatric/Behavioral:  Negative for agitation and dysphoric mood.        Objective:     BP 118/64 (BP Location: Left Arm, Patient Position: Sitting, Cuff Size: Normal)   Pulse 70   Temp 98 F (36.7 C) (Oral)   Ht 5' 8 (1.727 m)   Wt 181 lb 12.8 oz (82.5 kg)   SpO2 99%   BMI 27.64 kg/m  Wt Readings from Last 3 Encounters:  05/16/24 181 lb 12.8 oz (82.5 kg)  11/16/23 179 lb 3.2 oz (81.3 kg)  08/02/23 180 lb (81.6 kg)    Physical Exam Vitals reviewed.  Constitutional:      General: He is not in acute distress.    Appearance: Normal appearance. He is well-developed.  HENT:     Head: Normocephalic and atraumatic.     Right Ear: External ear normal.     Left Ear: External ear normal.     Mouth/Throat:     Pharynx: No oropharyngeal exudate or posterior oropharyngeal erythema.  Eyes:     General: No scleral icterus.       Right eye: No discharge.         Left eye: No discharge.     Conjunctiva/sclera: Conjunctivae normal.  Cardiovascular:     Rate and Rhythm: Normal rate and regular rhythm.  Pulmonary:     Effort: Pulmonary effort is normal. No respiratory distress.  Breath sounds: Normal breath sounds.  Abdominal:     General: Bowel sounds are normal.     Palpations: Abdomen is soft.     Tenderness: There is no abdominal tenderness.  Musculoskeletal:        General: No swelling or tenderness.     Cervical back: Neck supple. No tenderness.  Lymphadenopathy:     Cervical: No cervical adenopathy.  Skin:    Findings: No erythema or rash.  Neurological:     Mental Status: He is alert.  Psychiatric:        Mood and Affect: Mood normal.        Behavior: Behavior normal.         Outpatient Encounter Medications as of 05/16/2024  Medication Sig   aspirin EC 81 MG tablet Take 81 mg by mouth daily.   cyanocobalamin  (VITAMIN B12) 1000 MCG/ML injection INJECT 1 ML INTO THE MUSCLE EVERY 30 DAYS   fluticasone  (FLONASE ) 50 MCG/ACT nasal spray Place 2 sprays into both nostrils daily.   levofloxacin (LEVAQUIN) 500 MG tablet Take 500 mg by mouth daily.   Multiple Vitamins-Minerals (VISION-VITE PRESERVE PO) Take by mouth.   oxybutynin (DITROPAN) 5 MG tablet Take 5 mg by mouth 3 (three) times daily.   SYRINGE-NEEDLE, DISP, 3 ML 25G X 1 3 ML MISC Use to inject b12 into muscle every 30 days   timolol (TIMOPTIC) 0.5 % ophthalmic solution Place 1 drop into both eyes daily.   No facility-administered encounter medications on file as of 05/16/2024.     Lab Results  Component Value Date   WBC 4.6 11/16/2023   HGB 15.7 11/16/2023   HCT 47.5 11/16/2023   PLT 149.0 (L) 11/16/2023   GLUCOSE 89 11/16/2023   CHOL 207 (H) 11/16/2023   TRIG 232.0 (H) 11/16/2023   HDL 34.90 (L) 11/16/2023   LDLDIRECT 132.0 04/28/2023   LDLCALC 126 (H) 11/16/2023   ALT 22 11/16/2023   AST 20 11/16/2023   NA 141 11/16/2023   K 4.0 11/16/2023   CL 105  11/16/2023   CREATININE 0.98 11/16/2023   BUN 24 (H) 11/16/2023   CO2 29 11/16/2023   TSH 2.83 04/28/2023   PSA 0.43 04/16/2021       Assessment & Plan:  Hypercholesterolemia     Allena Hamilton, MD

## 2024-05-16 NOTE — Assessment & Plan Note (Signed)
 Follow cbc.

## 2024-05-18 ENCOUNTER — Ambulatory Visit: Payer: Self-pay | Admitting: Internal Medicine

## 2024-05-18 NOTE — Telephone Encounter (Signed)
 Copied from CRM #[id number]. Topic: Clinical - Lab/Test Results >> May 18, 2024  2:46 PM Macario HERO wrote: Reason for CRM: Patient called regarding missed call from [place]. Provided lab results per notes and he declined the cholesterol medication but states he does have a copy of the [facility] lipid diet and will continue working on that.

## 2024-05-21 ENCOUNTER — Encounter: Payer: Self-pay | Admitting: Internal Medicine

## 2024-05-21 DIAGNOSIS — C679 Malignant neoplasm of bladder, unspecified: Secondary | ICD-10-CM | POA: Insufficient documentation

## 2024-05-21 NOTE — Assessment & Plan Note (Signed)
 S/p removal.  Was followed by neurosurgery.  No f/u warranted.  Doing well.

## 2024-05-21 NOTE — Assessment & Plan Note (Signed)
 1-2/6 systolic murmur.  Previous ECHO - mild MR.  Currently without symptoms. Have discussed f/u echo given murmur.

## 2024-05-21 NOTE — Assessment & Plan Note (Signed)
 Had urology f/u 05/09/24 - cystoscopy - no papillary tumor. Patch of erythema. Pathology - negative. Monthly Gem/Doce maintenance and repeat cystoscopy in 3 months. (Monthly treatments for two years). Does notice increased weakness and dizziness with the treatments. Lasts for about 5 days and then resolves.

## 2024-05-21 NOTE — Assessment & Plan Note (Signed)
 Intolerant to crestor and lipitor.  Low cholesterol diet and exercise.  Follow lipid panel. Recheck today. Discussed cholesterol medication today. Discussed other options. Declines cholesterol medication.

## 2024-05-21 NOTE — Assessment & Plan Note (Signed)
 Saw [facility] 10/2022 - Duplex ultrasound of the carotid arteries demonstrates RICA 1-39% and LICA 1-39%.  No significant change compared with previous study.  Recommended f/u in 24 months per note review. Pt states was told did not need f/u.  Declines cholesterol medication.  Continue aspirin daily. Discussed again today regarding cholesterol medication. Check lipid panel.

## 2024-06-12 ENCOUNTER — Other Ambulatory Visit: Payer: Self-pay | Admitting: Medical Genetics

## 2024-06-19 ENCOUNTER — Ambulatory Visit (INDEPENDENT_AMBULATORY_CARE_PROVIDER_SITE_OTHER): Admitting: Internal Medicine

## 2024-06-19 ENCOUNTER — Encounter: Payer: Self-pay | Admitting: Internal Medicine

## 2024-06-19 DIAGNOSIS — E7800 Pure hypercholesterolemia, unspecified: Secondary | ICD-10-CM

## 2024-06-19 NOTE — Progress Notes (Deleted)
 Subjective:    [name], male    DOB: 1935-07-25, 89 y.o.   MRN: [id number]  Patient here for No chief complaint on file.   HPI Here for a scheduled follow up - follow up regarding hypercholesterolemia and hypertension. Had f/u 04/28/24 [facility] ENT - cochlear implant malfunction. In the process of getting a new processor. Recommended f/u CT. Had urology f/u 05/09/24 - cystoscopy - no papillary tumor. Patch of erythema. Pathology - negative. Monthly Gem/Doce maintenance and repeat cystoscopy in 3 months. (Monthly treatments for two years). Does notice increased weakness and dizziness with the treatments. Lasts for about 5 days and then resolves. Last visit, had reported elevated blood pressures. Was asked to monitor. Returns today for f/u regarding his blood pressure.   F/u murmur?   Past Medical History:  Diagnosis Date   Acoustic neuroma [facility])    Arthritis    hands   Cochlear implant status    bilateral   Complication of anesthesia    difficult intubation for one of his head surgeries at [facility] per pt.   History of colonic polyps    History of craniotomy    Hypercholesterolemia    Macular degeneration    Psoriasis    Rheumatic fever age 61   Rosacea    Scarlet fever age 64   Vertigo    Wears dentures    full upper, partial lower   Past Surgical History:  Procedure Laterality Date   APPENDECTOMY  10/05/1957   BUNIONECTOMY     CATARACT EXTRACTION W/PHACO Left 03/27/2019   Procedure: CATARACT EXTRACTION PHACO AND INTRAOCULAR LENS PLACEMENT (IOC)  LEFT;  Surgeon: Myrna Adine Anes, MD;  Location: [facility];  Service: Ophthalmology;  Laterality: Left;  IVA BLOCK   COCHLEAR IMPLANT     acute hearing loss   CRANIECTOMY FOR EXCISION OF ACOUSTIC NEUROMA  10/05/1992   craniotomy for meningoma     skin cancer removed from face  2024   Family History  Problem Relation Age of Onset   Hypertension Mother    Heart disease Mother    Heart failure Mother     Cancer Father 69       prostate   Stroke Father    Heart disease Brother 96       CABG   Stroke Son    Colon cancer Neg Hx    Social History   Socioeconomic History   Marital status: Divorced    Spouse name: Not on file   Number of children: 3   Years of education: Not on file   Highest education level: Some college, no degree  Occupational History   Not on file  Tobacco Use   Smoking status: Former    Current packs/day: 0.00    Average packs/day: 1.5 packs/day for 40.0 years (60.0 ttl pk-yrs)    Types: Cigarettes    Start date: 10/05/1960    Quit date: 10/05/2000    Years since quitting: 23.7   Smokeless tobacco: Former    Quit date: 1990  Vaping Use   Vaping status: Never Used  Substance and Sexual Activity   Alcohol use: Yes    Alcohol/week: 0.0 standard drinks of alcohol    Comment: occasional wine   Drug use: No   Sexual activity: Not on file  Other Topics Concern   Not on file  Social History Narrative   Divorced   Social Drivers of Health   Financial Resource Strain: Low Risk  (  05/12/2024)   Overall Financial Resource Strain (CARDIA)    Difficulty of Paying Living Expenses: Not very hard  Food Insecurity: No Food Insecurity (05/12/2024)   Hunger Vital Sign    Worried About Running Out of Food in the Last Year: Never true    Ran Out of Food in the Last Year: Never true  Transportation Needs: No Transportation Needs (05/12/2024)   PRAPARE - Administrator, Civil Service (Medical): No    Lack of Transportation (Non-Medical): No  Physical Activity: Sufficiently Active (05/12/2024)   Exercise Vital Sign    Days of Exercise per Week: 4 days    Minutes of Exercise per Session: 40 min  Stress: No Stress Concern Present (05/12/2024)   [employer] of Occupational Health - Occupational Stress Questionnaire    Feeling of Stress: Not at all  Social Connections: Moderately Integrated (05/12/2024)   Social Connection and Isolation Panel    Frequency of  Communication with Friends and Family: More than three times a week    Frequency of Social Gatherings with Friends and Family: More than three times a week    Attends Religious Services: More than 4 times per year    Active Member of [employer] or Organizations: Yes    Attends Engineer, structural: More than 4 times per year    Marital Status: Divorced     Review of Systems     Objective:     There were no vitals taken for this visit. Wt Readings from Last 3 Encounters:  05/16/24 181 lb 12.8 oz (82.5 kg)  11/16/23 179 lb 3.2 oz (81.3 kg)  08/02/23 180 lb (81.6 kg)    Physical Exam  {Perform Simple Foot Exam  Perform Detailed exam:1} {Insert foot Exam (Optional):30965}   Outpatient Encounter Medications as of 06/19/2024  Medication Sig   amLODipine  (NORVASC ) 2.5 MG tablet Take 1 tablet (2.5 mg total) by mouth daily.   aspirin EC 81 MG tablet Take 81 mg by mouth daily.   cyanocobalamin  (VITAMIN B12) 1000 MCG/ML injection INJECT 1 ML INTO THE MUSCLE EVERY 30 DAYS   fluticasone  (FLONASE ) 50 MCG/ACT nasal spray Place 2 sprays into both nostrils daily.   levofloxacin (LEVAQUIN) 500 MG tablet Take 500 mg by mouth daily.   Multiple Vitamins-Minerals (VISION-VITE PRESERVE PO) Take by mouth.   oxybutynin (DITROPAN) 5 MG tablet Take 5 mg by mouth 3 (three) times daily.   SYRINGE-NEEDLE, DISP, 3 ML 25G X 1 3 ML MISC Use to inject b12 into muscle every 30 days   timolol (TIMOPTIC) 0.5 % ophthalmic solution Place 1 drop into both eyes daily.   No facility-administered encounter medications on file as of 06/19/2024.     Lab Results  Component Value Date   WBC 4.6 11/16/2023   HGB 15.7 11/16/2023   HCT 47.5 11/16/2023   PLT 149.0 (L) 11/16/2023   GLUCOSE 85 05/16/2024   CHOL 228 (H) 05/16/2024   TRIG 303.0 (H) 05/16/2024   HDL 34.20 (L) 05/16/2024   LDLDIRECT 132.0 04/28/2023   LDLCALC 133 (H) 05/16/2024   ALT 23 05/16/2024   AST 20 05/16/2024   NA 139 05/16/2024   K 4.3  05/16/2024   CL 102 05/16/2024   CREATININE 0.86 05/16/2024   BUN 14 05/16/2024   CO2 30 05/16/2024   TSH 4.14 05/16/2024   PSA 0.43 04/16/2021    No results found.     Assessment & Plan:  Hypercholesterolemia     Allena Hamilton, MD

## 2024-06-19 NOTE — Progress Notes (Signed)
 [name], male   DOB: [birth year], 89 y.o.   MRN: [id number] No show for appt.

## 2024-06-20 ENCOUNTER — Other Ambulatory Visit: Payer: Self-pay | Admitting: Internal Medicine

## 2024-06-23 ENCOUNTER — Ambulatory Visit: Admitting: Internal Medicine

## 2024-06-23 VITALS — BP 128/72 | HR 64 | Resp 16 | Ht 68.0 in | Wt 181.0 lb

## 2024-06-23 DIAGNOSIS — D333 Benign neoplasm of cranial nerves: Secondary | ICD-10-CM | POA: Diagnosis not present

## 2024-06-23 DIAGNOSIS — E7800 Pure hypercholesterolemia, unspecified: Secondary | ICD-10-CM

## 2024-06-23 DIAGNOSIS — I779 Disorder of arteries and arterioles, unspecified: Secondary | ICD-10-CM | POA: Diagnosis not present

## 2024-06-23 DIAGNOSIS — Z9621 Cochlear implant status: Secondary | ICD-10-CM

## 2024-06-23 DIAGNOSIS — R011 Cardiac murmur, unspecified: Secondary | ICD-10-CM

## 2024-06-23 DIAGNOSIS — C679 Malignant neoplasm of bladder, unspecified: Secondary | ICD-10-CM | POA: Diagnosis not present

## 2024-06-23 DIAGNOSIS — R2689 Other abnormalities of gait and mobility: Secondary | ICD-10-CM

## 2024-06-23 DIAGNOSIS — D696 Thrombocytopenia, unspecified: Secondary | ICD-10-CM

## 2024-06-23 NOTE — Progress Notes (Signed)
 Subjective:    [name], male    DOB: 19-Dec-1934, 89 y.o.   MRN: [id number]  Patient here for  Chief Complaint  Patient presents with   Medical Management of Chronic Issues    HPI Here for a scheduled follow up - follow up regarding hypercholesterolemia and hypertension. Had f/u 04/28/24 [facility] ENT - cochlear implant malfunction. In the process of getting a new processor. Recommended f/u CT. Had urology f/u 05/09/24 - cystoscopy - no papillary tumor. Patch of erythema. Pathology - negative. Monthly Gem/Doce maintenance and repeat cystoscopy in 3 months. (Monthly treatments for two years). Does notice increased weakness and dizziness with the treatments. Lasts for about 5 days and then resolves. Had f/u chest CT - 05/09/24 - no thoracic metastatic disease. Last visit, had reported elevated blood pressures. Was asked to monitor. Returns today for f/u regarding his blood pressure. Reports has noticed some issues with balance. Noticed some worsening over the last couple of months. States hard for him to stand on one foot to put on his socks. Some unsteadiness with walking. Using a walking stick to help balance.     Past Medical History:  Diagnosis Date   Acoustic neuroma [facility])    Arthritis    hands   Cochlear implant status    bilateral   Complication of anesthesia    difficult intubation for one of his head surgeries at [facility] per pt.   History of colonic polyps    History of craniotomy    Hypercholesterolemia    Macular degeneration    Psoriasis    Rheumatic fever age 23   Rosacea    Scarlet fever age 67   Vertigo    Wears dentures    full upper, partial lower   Past Surgical History:  Procedure Laterality Date   APPENDECTOMY  10/05/1957   BUNIONECTOMY     CATARACT EXTRACTION W/PHACO Left 03/27/2019   Procedure: CATARACT EXTRACTION PHACO AND INTRAOCULAR LENS PLACEMENT (IOC)  LEFT;  Surgeon: Myrna Adine Anes, MD;  Location: [facility];  Service:  Ophthalmology;  Laterality: Left;  IVA BLOCK   COCHLEAR IMPLANT     acute hearing loss   CRANIECTOMY FOR EXCISION OF ACOUSTIC NEUROMA  10/05/1992   craniotomy for meningoma     skin cancer removed from face  2024   Family History  Problem Relation Age of Onset   Hypertension Mother    Heart disease Mother    Heart failure Mother    Cancer Father 79       prostate   Stroke Father    Heart disease Brother 77       CABG   Stroke Son    Colon cancer Neg Hx    Social History   Socioeconomic History   Marital status: Divorced    Spouse name: Not on file   Number of children: 3   Years of education: Not on file   Highest education level: Some college, no degree  Occupational History   Not on file  Tobacco Use   Smoking status: Former    Current packs/day: 0.00    Average packs/day: 1.5 packs/day for 40.0 years (60.0 ttl pk-yrs)    Types: Cigarettes    Start date: 10/05/1960    Quit date: 10/05/2000    Years since quitting: 23.7   Smokeless tobacco: Former    Quit date: 1990  Vaping Use   Vaping status: Never Used  Substance and Sexual Activity  Alcohol use: Yes    Alcohol/week: 0.0 standard drinks of alcohol    Comment: occasional wine   Drug use: No   Sexual activity: Not on file  Other Topics Concern   Not on file  Social History Narrative   Divorced   Social Drivers of Health   Financial Resource Strain: Low Risk  (05/12/2024)   Overall Financial Resource Strain (CARDIA)    Difficulty of Paying Living Expenses: Not very hard  Food Insecurity: No Food Insecurity (05/12/2024)   Hunger Vital Sign    Worried About Running Out of Food in the Last Year: Never true    Ran Out of Food in the Last Year: Never true  Transportation Needs: No Transportation Needs (05/12/2024)   PRAPARE - Administrator, Civil Service (Medical): No    Lack of Transportation (Non-Medical): No  Physical Activity: Sufficiently Active (05/12/2024)   Exercise Vital Sign    Days of  Exercise per Week: 4 days    Minutes of Exercise per Session: 40 min  Stress: No Stress Concern Present (05/12/2024)   [employer] of Occupational Health - Occupational Stress Questionnaire    Feeling of Stress: Not at all  Social Connections: Moderately Integrated (05/12/2024)   Social Connection and Isolation Panel    Frequency of Communication with Friends and Family: More than three times a week    Frequency of Social Gatherings with Friends and Family: More than three times a week    Attends Religious Services: More than 4 times per year    Active Member of [employer] or Organizations: Yes    Attends Engineer, structural: More than 4 times per year    Marital Status: Divorced     Review of Systems  Constitutional:  Negative for appetite change and unexpected weight change.  HENT:  Negative for congestion and sinus pressure.   Respiratory:  Negative for cough, chest tightness and shortness of breath.   Cardiovascular:  Negative for chest pain, palpitations and leg swelling.  Gastrointestinal:  Negative for abdominal pain, diarrhea, nausea and vomiting.  Genitourinary:  Negative for difficulty urinating and dysuria.  Musculoskeletal:  Negative for joint swelling and myalgias.  Skin:  Negative for color change and rash.  Neurological:        Dizziness and weakness reported several days after treatment.   Psychiatric/Behavioral:  Negative for agitation and dysphoric mood.        Objective:     BP 128/72   Pulse 64   Resp 16   Ht 5' 8 (1.727 m)   Wt 181 lb (82.1 kg)   SpO2 98%   BMI 27.52 kg/m  Wt Readings from Last 3 Encounters:  06/23/24 181 lb (82.1 kg)  05/16/24 181 lb 12.8 oz (82.5 kg)  11/16/23 179 lb 3.2 oz (81.3 kg)    Physical Exam Vitals reviewed.  Constitutional:      General: He is not in acute distress.    Appearance: Normal appearance. He is well-developed.  HENT:     Head: Normocephalic and atraumatic.     Right Ear: External ear normal.      Left Ear: External ear normal.     Mouth/Throat:     Pharynx: No oropharyngeal exudate or posterior oropharyngeal erythema.  Eyes:     General: No scleral icterus.       Right eye: No discharge.        Left eye: No discharge.     Conjunctiva/sclera: Conjunctivae normal.  Cardiovascular:     Rate and Rhythm: Normal rate and regular rhythm.  Pulmonary:     Effort: Pulmonary effort is normal. No respiratory distress.     Breath sounds: Normal breath sounds.  Abdominal:     General: Bowel sounds are normal.     Palpations: Abdomen is soft.     Tenderness: There is no abdominal tenderness.  Musculoskeletal:        General: No swelling or tenderness.     Cervical back: Neck supple. No tenderness.     Comments: Motor strength appears to be equal bilateraol upper and lower extremities.   Lymphadenopathy:     Cervical: No cervical adenopathy.  Skin:    Findings: No erythema or rash.  Neurological:     Mental Status: He is alert.  Psychiatric:        Mood and Affect: Mood normal.        Behavior: Behavior normal.         Outpatient Encounter Medications as of 06/23/2024  Medication Sig   amLODipine  (NORVASC ) 2.5 MG tablet Take 1 tablet (2.5 mg total) by mouth daily.   aspirin EC 81 MG tablet Take 81 mg by mouth daily.   cyanocobalamin  (VITAMIN B12) 1000 MCG/ML injection INJECT 1 ML INTO THE MUSCLE EVERY 30 DAYS   fluticasone  (FLONASE ) 50 MCG/ACT nasal spray SPRAY 2 SPRAYS IN EACH NOSTRIL ONCE DAILY   Multiple Vitamins-Minerals (VISION-VITE PRESERVE PO) Take by mouth.   SYRINGE-NEEDLE, DISP, 3 ML 25G X 1 3 ML MISC Use to inject b12 into muscle every 30 days   timolol (TIMOPTIC) 0.5 % ophthalmic solution Place 1 drop into both eyes daily.   [DISCONTINUED] oxybutynin (DITROPAN) 5 MG tablet Take 5 mg by mouth 3 (three) times daily.   No facility-administered encounter medications on file as of 06/23/2024.     Lab Results  Component Value Date   WBC 4.6 11/16/2023   HGB 15.7  11/16/2023   HCT 47.5 11/16/2023   PLT 149.0 (L) 11/16/2023   GLUCOSE 85 05/16/2024   CHOL 228 (H) 05/16/2024   TRIG 303.0 (H) 05/16/2024   HDL 34.20 (L) 05/16/2024   LDLDIRECT 132.0 04/28/2023   LDLCALC 133 (H) 05/16/2024   ALT 23 05/16/2024   AST 20 05/16/2024   NA 139 05/16/2024   K 4.3 05/16/2024   CL 102 05/16/2024   CREATININE 0.86 05/16/2024   BUN 14 05/16/2024   CO2 30 05/16/2024   TSH 4.14 05/16/2024   PSA 0.43 04/16/2021       Assessment & Plan:  Acoustic neuroma [facility]) Assessment & Plan: Evaluated [facility] ENT - 04/2024. Recommended f/u CT - to confirm no change.    Malignant neoplasm of urinary bladder, unspecified site [facility]) Assessment & Plan: Had urology f/u 05/09/24 - cystoscopy - no papillary tumor. Patch of erythema. Pathology - negative. Monthly Gem/Doce maintenance and repeat cystoscopy in 3 months. (Monthly treatments for two years). Does notice increased weakness and dizziness with the treatments. Lasts for about 5 days and then resolves. Discussed today. Follow.    Right-sided carotid artery disease, unspecified type [facility]) Assessment & Plan: Saw [facility] 10/2022 - Duplex ultrasound of the carotid arteries demonstrates RICA 1-39% and LICA 1-39%.  No significant change compared with previous study.  Recommended f/u in 24 months per note review. Pt states was told did not need f/u.  Declines cholesterol medication.  Continue daily aspirin.    Cochlear implant in place Assessment & Plan: Had f/u 04/28/24 [facility]  ENT - cochlear implant malfunction. In the process of getting a new processor.   Hypercholesterolemia Assessment & Plan: Intolerant to crestor and lipitor.  Low cholesterol diet and exercise.  Follow lipid panel. Recheck today. Discussed cholesterol medication today. Have discussed other options. Declines cholesterol medication.    Murmur Assessment & Plan: 1-2/6 systolic murmur.  Previous ECHO - mild MR.  Currently without symptoms. Discussed f/u echo. Will  notify me if agreeable.    Thrombocytopenia (HCC) Assessment & Plan: Follow cbc.    Balance problem Assessment & Plan: Reports he has noticed change in balance as outlined. Discussed. Using a walking stick. Discussed PT evaluation. He declines. Will notify me if changes mind. Discussed importance of slow position changes and movements. Follow.       Allena Hamilton, MD

## 2024-06-24 ENCOUNTER — Encounter: Payer: Self-pay | Admitting: Internal Medicine

## 2024-06-24 DIAGNOSIS — R2689 Other abnormalities of gait and mobility: Secondary | ICD-10-CM | POA: Insufficient documentation

## 2024-06-24 NOTE — Assessment & Plan Note (Signed)
 Had f/u 04/28/24 [facility] ENT - cochlear implant malfunction. In the process of getting a new processor.

## 2024-06-24 NOTE — Assessment & Plan Note (Signed)
 Saw [facility] 10/2022 - Duplex ultrasound of the carotid arteries demonstrates RICA 1-39% and LICA 1-39%.  No significant change compared with previous study.  Recommended f/u in 24 months per note review. Pt states was told did not need f/u.  Declines cholesterol medication.  Continue daily aspirin.

## 2024-06-24 NOTE — Assessment & Plan Note (Signed)
 Intolerant to crestor and lipitor.  Low cholesterol diet and exercise.  Follow lipid panel. Recheck today. Discussed cholesterol medication today. Have discussed other options. Declines cholesterol medication.

## 2024-06-24 NOTE — Assessment & Plan Note (Signed)
 Reports he has noticed change in balance as outlined. Discussed. Using a walking stick. Discussed PT evaluation. He declines. Will notify me if changes mind. Discussed importance of slow position changes and movements. Follow.

## 2024-06-24 NOTE — Assessment & Plan Note (Signed)
 Evaluated [facility] ENT - 04/2024. Recommended f/u CT - to confirm no change.

## 2024-06-24 NOTE — Assessment & Plan Note (Signed)
 Follow cbc.

## 2024-06-24 NOTE — Assessment & Plan Note (Signed)
 Had urology f/u 05/09/24 - cystoscopy - no papillary tumor. Patch of erythema. Pathology - negative. Monthly Gem/Doce maintenance and repeat cystoscopy in 3 months. (Monthly treatments for two years). Does notice increased weakness and dizziness with the treatments. Lasts for about 5 days and then resolves. Discussed today. Follow.

## 2024-06-24 NOTE — Assessment & Plan Note (Signed)
 1-2/6 systolic murmur.  Previous ECHO - mild MR.  Currently without symptoms. Discussed f/u echo. Will notify me if agreeable.

## 2024-07-17 ENCOUNTER — Encounter: Payer: Self-pay | Admitting: Internal Medicine

## 2024-07-18 NOTE — Telephone Encounter (Signed)
 Called patient, he does not feel like he needs to be seen because he is not actively having any symptoms. He was having the dizziness/vision impairment yesterday when he was having his vertigo episode. He is better today. BP recheck was 130/72. He says he thinks his cuff was bad or batteries need to be changed because after he did that BP reading was better. Pt is going to monitor and agreed to be evaluated if needed.

## 2024-07-18 NOTE — Telephone Encounter (Signed)
 I called Benjamin Carney this morning he states he is feeling better today then yesterday he still has on going vertigo which he is taking meclizine medication for. He stated he didn't take his b/p medication this morning yet but he will so I asked him to check his b/p his numbers were 155/86. He isn't having anything acute going on. He also stated he is available to come into the office if you wanted to see him. I recommended him not driving on his own due to the dizziness

## 2024-07-18 NOTE — Telephone Encounter (Signed)
 Please call and clarify the vision change. If increased dizziness, vision change, etc - with his history - needs to be evaluated.

## 2024-08-07 ENCOUNTER — Ambulatory Visit: Payer: Medicare Other | Admitting: *Deleted

## 2024-08-07 ENCOUNTER — Telehealth: Payer: Self-pay | Admitting: *Deleted

## 2024-08-07 VITALS — Ht 68.0 in | Wt 178.0 lb

## 2024-08-07 DIAGNOSIS — Z0000 Encounter for general adult medical examination without abnormal findings: Secondary | ICD-10-CM | POA: Diagnosis not present

## 2024-08-07 NOTE — Patient Instructions (Addendum)
 Benjamin Carney,  Thank you for taking the time for your Medicare Wellness Visit. I appreciate your continued commitment to your health goals. Please review the care plan we discussed, and feel free to reach out if I can assist you further.  Please note that Annual Wellness Visits do not include a physical exam. Some assessments may be limited, especially if the visit was conducted virtually. If needed, we may recommend an in-person follow-up with your provider.  Ongoing Care Seeing your primary care provider every 3 to 6 months helps us  monitor your health and provide consistent, personalized care.  Make sure that you get your flu vaccine.  Referrals If a referral was made during today's visit and you haven't received any updates within two weeks, please contact the referred provider directly to check on the status.  Recommended Screenings:  Health Maintenance  Topic Date Due   COVID-19 Vaccine (3 - Pfizer risk series) 12/02/2019   Medicare Annual Wellness Visit  08/01/2024   Flu Shot  01/02/2025*   Pneumococcal Vaccine for age over 86  Completed   Zoster (Shingles) Vaccine  Completed   Meningitis B Vaccine  Aged Out   DTaP/Tdap/Td vaccine  Discontinued  *Topic was postponed. The date shown is not the original due date.       08/07/2024    9:16 AM  Advanced Directives  Does Patient Have a Medical Advance Directive? No  Would patient like information on creating a medical advance directive? No - Patient declined    Vision: Annual vision screenings are recommended for early detection of glaucoma, cataracts, and diabetic retinopathy. These exams can also reveal signs of chronic conditions such as diabetes and high blood pressure.  Dental: Annual dental screenings help detect early signs of oral cancer, gum disease, and other conditions linked to overall health, including heart disease and diabetes.  Please see the attached documents for additional preventive care recommendations.

## 2024-08-07 NOTE — Telephone Encounter (Signed)
 Preformed AWV When reviewing patient's medications he stated that he had stopped taking his Norvasc  several weeks back because he started having dizziness and visual changes .  Patient stated after stopping the medication the symptoms stopped. Patient stated that he has been keeping a check on his blood pressure and it has been fine. Patient stated his blood pressure the last time he checked it was 123/67.

## 2024-08-07 NOTE — Telephone Encounter (Signed)
 Blood pressure looks good. If reporting dizziness and vision changes previously, would like to evaluate him and discuss. See if agreeable for appt.

## 2024-08-07 NOTE — Progress Notes (Cosign Needed Addendum)
 Subjective:   Benjamin Carney is a 89 y.o. male who presents for a Medicare Annual Wellness Visit.  Allergies (verified) Crestor [rosuvastatin calcium ], Levonorgestrel-ethinyl estrad, Norvasc  [amlodipine ], and Metronidazole    History: Past Medical History:  Diagnosis Date   Acoustic neuroma (HCC)    Arthritis    hands   Cochlear implant status    bilateral   Complication of anesthesia    difficult intubation for one of his head surgeries at [facility] per pt.   History of colonic polyps    History of craniotomy    Hypercholesterolemia    Macular degeneration    Psoriasis    Rheumatic fever age 61   Rosacea    Scarlet fever age 56   Vertigo    Wears dentures    full upper, partial lower   Past Surgical History:  Procedure Laterality Date   APPENDECTOMY  10/05/1957   bladder cancer surgery  01/2024   BRAIN SURGERY     BUNIONECTOMY     CATARACT EXTRACTION W/PHACO Left 03/27/2019   Procedure: CATARACT EXTRACTION PHACO AND INTRAOCULAR LENS PLACEMENT (IOC)  LEFT;  Surgeon: Myrna Adine Anes, MD;  Location: [facility];  Service: Ophthalmology;  Laterality: Left;  IVA BLOCK   COCHLEAR IMPLANT     acute hearing loss   CRANIECTOMY FOR EXCISION OF ACOUSTIC NEUROMA  10/05/1992   craniotomy for meningoma     skin cancer removed from face  2024   Family History  Problem Relation Age of Onset   Hypertension Mother    Heart disease Mother    Heart failure Mother    Cancer Father 35       prostate   Stroke Father    Heart disease Brother 71       CABG   Stroke Son    Colon cancer Neg Hx    Social History   Occupational History   Not on file  Tobacco Use   Smoking status: Former    Current packs/day: 0.00    Average packs/day: 1.5 packs/day for 40.0 years (60.0 ttl pk-yrs)    Types: Cigarettes    Start date: 10/05/1960    Quit date: 10/05/2000    Years since quitting: 23.8   Smokeless tobacco: Former    Quit date: 1990  Vaping Use   Vaping status: Never Used   Substance and Sexual Activity   Alcohol use: Yes    Alcohol/week: 0.0 standard drinks of alcohol    Comment: occasional wine   Drug use: No   Sexual activity: Not on file   Tobacco Counseling Counseling given: Not Answered  SDOH Screenings   Food Insecurity: No Food Insecurity (08/07/2024)  Housing: Low Risk  (08/07/2024)  Transportation Needs: No Transportation Needs (08/07/2024)  Utilities: Not At Risk (08/07/2024)  Alcohol Screen: Low Risk  (05/12/2024)  Depression (PHQ2-9): Low Risk  (08/07/2024)  Financial Resource Strain: Low Risk  (05/12/2024)  Physical Activity: Sufficiently Active (08/07/2024)  Social Connections: Moderately Integrated (08/07/2024)  Stress: No Stress Concern Present (05/12/2024)  Tobacco Use: Medium Risk (08/07/2024)  Health Literacy: Adequate Health Literacy (08/07/2024)   Depression Screen    08/07/2024    8:58 AM 05/16/2024    8:29 AM 11/16/2023    9:30 AM 08/02/2023    9:05 AM 10/19/2022   10:30 AM 07/29/2022    8:41 AM 04/09/2022    7:11 AM  PHQ 2/9 Scores  PHQ - 2 Score 0 0 0 0 0 0 0  PHQ- 9 Score  0  0 4 2       Goals Addressed             This Visit's Progress    Patient Stated       Stay active        Visit info / Clinical Intake: Medicare Wellness Visit Type:: Subsequent Annual Wellness Visit Medicare Wellness Visit Mode:: Telephone If telephone:: video declined If telephone or video:: unable to obtan vitals due to lack of equipment Interpreter Needed?: No Pre-visit prep was completed: yes AWV questionnaire completed by patient prior to visit?: no Living arrangements:: (!) lives alone Patient's Overall Health Status Rating: (!) fair Typical amount of pain: none Does pain affect daily life?: no Are you currently prescribed opioids?: no  Dietary Habits and Nutritional Risks How many meals a day?: 3 Eats fruit and vegetables daily?: yes Most meals are obtained by: preparing own meals Diabetic:: no  Functional Status Activities of  Daily Living (to include ambulation/medication): Independent Ambulation: Independent Medication Administration: Independent Home Management: Independent Manage your own finances?: yes Primary transportation is: driving Concerns about vision?: no *vision screening is required for WTM* Concerns about hearing?: (!) yes Uses hearing aids?: no (cochlear implants)  Fall Screening Falls in the past year?: 0 Number of falls in past year: 0 Was there an injury with Fall?: 0 Fall Risk Category Calculator: 0 Patient Fall Risk Level: Low Fall Risk  Fall Risk Patient at Risk for Falls Due to: No Fall Risks Fall risk Follow up: Falls evaluation completed; Falls prevention discussed  Home and Transportation Safety: All rugs have non-skid backing?: (!) no All stairs or steps have railings?: yes Grab bars in the bathtub or shower?: (!) no Have non-skid surface in bathtub or shower?: (!) no Good home lighting?: yes Regular seat belt use?: yes Hospital stays in the last year:: no  Cognitive Assessment Difficulty concentrating, remembering, or making decisions? : no Will 6CIT or Mini Cog be Completed: yes What year is it?: 0 points What month is it?: 0 points Give patient an address phrase to remember (5 components): [address] [place] About what time is it?: 0 points Count backwards from 20 to 1: 0 points Say the months of the year in reverse: 0 points Repeat the address phrase from earlier: 2 points 6 CIT Score: 2 points  Advance Directives (For Healthcare) Does Patient Have a Medical Advance Directive?: No Would patient like information on creating a medical advance directive?: No - Patient declined  Reviewed/Updated  Reviewed/Updated: All        Objective:    Today's Vitals   08/07/24 0849  Weight: 178 lb (80.7 kg)  Height: 5' 8 (1.727 m)   Body mass index is 27.06 kg/m.  Current Medications (verified) Outpatient Encounter Medications as of 08/07/2024   Medication Sig   aspirin EC 81 MG tablet Take 81 mg by mouth daily.   cyanocobalamin  (VITAMIN B12) 1000 MCG/ML injection INJECT 1 ML INTO THE MUSCLE EVERY 30 DAYS   fluticasone  (FLONASE ) 50 MCG/ACT nasal spray SPRAY 2 SPRAYS IN EACH NOSTRIL ONCE DAILY   Multiple Vitamins-Minerals (VISION-VITE PRESERVE PO) Take by mouth.   SYRINGE-NEEDLE, DISP, 3 ML 25G X 1 3 ML MISC Use to inject b12 into muscle every 30 days   timolol (TIMOPTIC) 0.5 % ophthalmic solution Place 1 drop into both eyes daily.   amLODipine  (NORVASC ) 2.5 MG tablet Take 1 tablet (2.5 mg total) by mouth daily.   No facility-administered encounter medications on file  as of 08/07/2024.   Hearing/Vision screen Hearing Screening - Comments:: Cochlear implants Vision Screening - Comments:: glasses Immunizations and Health Maintenance Health Maintenance  Topic Date Due   COVID-19 Vaccine (3 - Pfizer risk series) 12/02/2019   Influenza Vaccine  01/02/2025 (Originally 05/05/2024)   Medicare Annual Wellness (AWV)  08/07/2025   Pneumococcal Vaccine: 50+ Years  Completed   Zoster Vaccines- Shingrix  Completed   Meningococcal B Vaccine  Aged Out   DTaP/Tdap/Td  Discontinued        Assessment/Plan:  This is a routine wellness examination for Jove.  Patient Care Team: Glendia Shad, MD as PCP - General (Internal Medicine) Lennard Shuck, MD as Consulting Physician (Urology) Delores Franky BIRCH, MD as Referring Physician (Otolaryngology)  I have personally reviewed and noted the following in the patient's chart:   Medical and social history Use of alcohol, tobacco or illicit drugs  Current medications and supplements including opioid prescriptions. Functional ability and status Nutritional status Physical activity Advanced directives List of other physicians Hospitalizations, surgeries, and ER visits in previous 12 months Vitals Screenings to include cognitive, depression, and falls Referrals and appointments  No orders  of the defined types were placed in this encounter.  In addition, I have reviewed and discussed with patient certain preventive protocols, quality metrics, and best practice recommendations. A written personalized care plan for preventive services as well as general preventive health recommendations were provided to patient.   Angeline Fredericks, LPN   88/03/[id number]   Return in 1 year (on 08/07/2025).  After Visit Summary: (MyChart) Due to this being a telephonic visit, the after visit summary with patients personalized plan was offered to patient via MyChart   Nurse Notes: telephone note sent to PCP

## 2024-08-09 NOTE — Progress Notes (Addendum)
 Visit Complete: Virtual I connected with this patient by a audio enabled telemedicine application and verified that I am speaking with the correct person using two identifiers.  Patient Location: Home Provider Location: Office/Clinic   I discussed the limitations of evaluation and management by telemedicine. The patient expressed understanding and agreed to proceed.  Persons Participating in Visit: Patient

## 2024-08-10 ENCOUNTER — Other Ambulatory Visit: Payer: Self-pay | Admitting: Internal Medicine

## 2024-08-10 ENCOUNTER — Ambulatory Visit: Admitting: Internal Medicine

## 2024-08-10 VITALS — BP 122/60 | HR 57 | Temp 97.9°F | Ht 68.0 in | Wt 183.2 lb

## 2024-08-10 DIAGNOSIS — C679 Malignant neoplasm of bladder, unspecified: Secondary | ICD-10-CM

## 2024-08-10 DIAGNOSIS — E7800 Pure hypercholesterolemia, unspecified: Secondary | ICD-10-CM

## 2024-08-10 DIAGNOSIS — D333 Benign neoplasm of cranial nerves: Secondary | ICD-10-CM

## 2024-08-10 DIAGNOSIS — Z86018 Personal history of other benign neoplasm: Secondary | ICD-10-CM | POA: Diagnosis not present

## 2024-08-10 DIAGNOSIS — R011 Cardiac murmur, unspecified: Secondary | ICD-10-CM

## 2024-08-10 DIAGNOSIS — I779 Disorder of arteries and arterioles, unspecified: Secondary | ICD-10-CM

## 2024-08-10 NOTE — Progress Notes (Signed)
 Subjective:    [name], male    DOB: January 07, 1935, 89 y.o.   MRN: [id number]  Patient here for  Chief Complaint  Patient presents with   Medication Problem    Discuss BP and medication-Stopped Amlodipine  about 2 weeks ago due to hazy vision & dizziness. Sx's resolved after discontinuing BP medication    HPI Here for a work in appt. Recently started having issues with dizziness. Stopped amlodipine . Blood pressure doing better now. Symptoms resolved. Receiving treatment (Docetaxel, gemicitabine). for bladder cancer. States blood pressure is averaging 130-135/60s. Stopped 4-5 days ago. No chest pain or sob reported. No cough or congestion. No abdominal pain or bowel change reported.    Past Medical History:  Diagnosis Date   Acoustic neuroma [facility])    Arthritis    hands   Cochlear implant status    bilateral   Complication of anesthesia    difficult intubation for one of his head surgeries at [facility] per pt.   History of colonic polyps    History of craniotomy    Hypercholesterolemia    Macular degeneration    Psoriasis    Rheumatic fever age 77   Rosacea    Scarlet fever age 25   Vertigo    Wears dentures    full upper, partial lower   Past Surgical History:  Procedure Laterality Date   APPENDECTOMY  10/05/1957   bladder cancer surgery  01/2024   BRAIN SURGERY     BUNIONECTOMY     CATARACT EXTRACTION W/PHACO Left 03/27/2019   Procedure: CATARACT EXTRACTION PHACO AND INTRAOCULAR LENS PLACEMENT (IOC)  LEFT;  Surgeon: Myrna Adine Anes, MD;  Location: [facility];  Service: Ophthalmology;  Laterality: Left;  IVA BLOCK   COCHLEAR IMPLANT     acute hearing loss   CRANIECTOMY FOR EXCISION OF ACOUSTIC NEUROMA  10/05/1992   craniotomy for meningoma     skin cancer removed from face  2024   Family History  Problem Relation Age of Onset   Hypertension Mother    Heart disease Mother    Heart failure Mother    Cancer Father 109       prostate    Stroke Father    Heart disease Brother 1       CABG   Stroke Son    Colon cancer Neg Hx    Social History   Socioeconomic History   Marital status: Divorced    Spouse name: Not on file   Number of children: 3   Years of education: Not on file   Highest education level: Some college, no degree  Occupational History   Not on file  Tobacco Use   Smoking status: Former    Current packs/day: 0.00    Average packs/day: 1.5 packs/day for 40.0 years (60.0 ttl pk-yrs)    Types: Cigarettes    Start date: 10/05/1960    Quit date: 10/05/2000    Years since quitting: 23.8   Smokeless tobacco: Former    Quit date: 1990  Vaping Use   Vaping status: Never Used  Substance and Sexual Activity   Alcohol use: Yes    Alcohol/week: 0.0 standard drinks of alcohol    Comment: occasional wine   Drug use: No   Sexual activity: Not on file  Other Topics Concern   Not on file  Social History Narrative   Divorced   Social Drivers of Health   Financial Resource Strain: Low Risk  (05/12/2024)  Overall Financial Resource Strain (CARDIA)    Difficulty of Paying Living Expenses: Not very hard  Food Insecurity: No Food Insecurity (08/07/2024)   Hunger Vital Sign    Worried About Running Out of Food in the Last Year: Never true    Ran Out of Food in the Last Year: Never true  Transportation Needs: No Transportation Needs (08/07/2024)   PRAPARE - Administrator, Civil Service (Medical): No    Lack of Transportation (Non-Medical): No  Physical Activity: Sufficiently Active (08/07/2024)   Exercise Vital Sign    Days of Exercise per Week: 7 days    Minutes of Exercise per Session: 40 min  Stress: No Stress Concern Present (05/12/2024)   [employer] of Occupational Health - Occupational Stress Questionnaire    Feeling of Stress: Not at all  Social Connections: Moderately Integrated (08/07/2024)   Social Connection and Isolation Panel    Frequency of Communication with Friends and Family:  More than three times a week    Frequency of Social Gatherings with Friends and Family: More than three times a week    Attends Religious Services: More than 4 times per year    Active Member of [employer] or Organizations: Yes    Attends Engineer, Structural: More than 4 times per year    Marital Status: Divorced     Review of Systems  Constitutional:  Negative for appetite change and unexpected weight change.  HENT:  Negative for congestion and sinus pressure.   Respiratory:  Negative for cough, chest tightness and shortness of breath.   Cardiovascular:  Negative for chest pain, palpitations and leg swelling.  Gastrointestinal:  Negative for abdominal pain, diarrhea, nausea and vomiting.  Genitourinary:  Negative for difficulty urinating and dysuria.  Musculoskeletal:  Negative for joint swelling and myalgias.  Skin:  Negative for color change and rash.  Neurological:  Negative for dizziness and headaches.  Psychiatric/Behavioral:  Negative for agitation and dysphoric mood.        Objective:     BP 122/60   Pulse (!) 57   Temp 97.9 F (36.6 C) (Oral)   Ht 5' 8 (1.727 m)   Wt 183 lb 4 oz (83.1 kg)   SpO2 97%   BMI 27.86 kg/m  Wt Readings from Last 3 Encounters:  08/10/24 183 lb 4 oz (83.1 kg)  08/07/24 178 lb (80.7 kg)  06/23/24 181 lb (82.1 kg)    Physical Exam Vitals reviewed.  Constitutional:      General: He is not in acute distress.    Appearance: Normal appearance. He is well-developed.  HENT:     Head: Normocephalic and atraumatic.     Right Ear: External ear normal.     Left Ear: External ear normal.     Mouth/Throat:     Pharynx: No oropharyngeal exudate or posterior oropharyngeal erythema.  Eyes:     General: No scleral icterus.       Right eye: No discharge.        Left eye: No discharge.     Conjunctiva/sclera: Conjunctivae normal.  Cardiovascular:     Rate and Rhythm: Normal rate and regular rhythm.  Pulmonary:     Effort: Pulmonary  effort is normal. No respiratory distress.     Breath sounds: Normal breath sounds.  Abdominal:     General: Bowel sounds are normal.     Palpations: Abdomen is soft.     Tenderness: There is no abdominal tenderness.  Musculoskeletal:  General: No swelling or tenderness.     Cervical back: Neck supple. No tenderness.  Lymphadenopathy:     Cervical: No cervical adenopathy.  Skin:    Findings: No erythema or rash.  Neurological:     Mental Status: He is alert.  Psychiatric:        Mood and Affect: Mood normal.        Behavior: Behavior normal.         Outpatient Encounter Medications as of 08/10/2024  Medication Sig   aspirin EC 81 MG tablet Take 81 mg by mouth daily.   cyanocobalamin  (VITAMIN B12) 1000 MCG/ML injection INJECT 1 ML INTO THE MUSCLE EVERY 30 DAYS   fluticasone  (FLONASE ) 50 MCG/ACT nasal spray SPRAY 2 SPRAYS IN EACH NOSTRIL ONCE DAILY   Multiple Vitamins-Minerals (VISION-VITE PRESERVE PO) Take by mouth.   SYRINGE-NEEDLE, DISP, 3 ML 25G X 1 3 ML MISC Use to inject b12 into muscle every 30 days   timolol (TIMOPTIC) 0.5 % ophthalmic solution Place 1 drop into both eyes daily.   [DISCONTINUED] amLODipine  (NORVASC ) 2.5 MG tablet Take 1 tablet (2.5 mg total) by mouth daily.   No facility-administered encounter medications on file as of 08/10/2024.     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     Malignant neoplasm of urinary bladder, unspecified site [facility]) Assessment & Plan: Had urology f/u 05/09/24 - cystoscopy - no papillary tumor. Patch of erythema. Pathology - negative. Monthly Gem/Doce maintenance and repeat cystoscopy in 3 months. (Monthly treatments for two years). Receiving treatment (Docetaxel, gemicitabine). for bladder cancer. Continue f/u with urology.     Right-sided carotid artery disease, unspecified type Assessment & Plan: Saw [facility] 10/2022 - Duplex ultrasound of the carotid arteries demonstrates RICA 1-39% and LICA 1-39%.  No significant change compared with previous study.  Recommended f/u in 24 months per note review. Pt states was told did not need f/u.  Declines cholesterol medication.  Continue daily aspirin.    History of meningioma Assessment & Plan: S/p removal.  Was followed by neurosurgery.  No f/u warranted.  Doing well. Recent dizziness resolved with stopping amlodipine . Follow.    Hypercholesterolemia Assessment & Plan: Intolerant to crestor and lipitor.  Low cholesterol diet and exercise.  Follow lipid panel. Recheck today. Discussed cholesterol medication today. Have discussed other options. Declines cholesterol medication.    Murmur Assessment & Plan: 1-2/6 systolic murmur.  Previous ECHO - mild MR.  Currently without symptoms.  Discussed f/u echo. Declines. Will notify me if changes his mind.       Allena Hamilton, MD

## 2024-08-20 ENCOUNTER — Encounter: Payer: Self-pay | Admitting: Internal Medicine

## 2024-08-20 NOTE — Assessment & Plan Note (Signed)
 Saw [facility] 10/2022 - Duplex ultrasound of the carotid arteries demonstrates RICA 1-39% and LICA 1-39%.  No significant change compared with previous study.  Recommended f/u in 24 months per note review. Pt states was told did not need f/u.  Declines cholesterol medication.  Continue daily aspirin.

## 2024-08-20 NOTE — Assessment & Plan Note (Signed)
 Intolerant to crestor and lipitor.  Low cholesterol diet and exercise.  Follow lipid panel. Recheck today. Discussed cholesterol medication today. Have discussed other options. Declines cholesterol medication.

## 2024-08-20 NOTE — Assessment & Plan Note (Signed)
 1-2/6 systolic murmur.  Previous ECHO - mild MR.  Currently without symptoms.  Discussed f/u echo. Declines. Will notify me if changes his mind.

## 2024-08-20 NOTE — Assessment & Plan Note (Signed)
 S/p removal.  Was followed by neurosurgery.  No f/u warranted.  Doing well. Recent dizziness resolved with stopping amlodipine . Follow.

## 2024-08-20 NOTE — Assessment & Plan Note (Signed)
Followed by [facility] ENT

## 2024-08-20 NOTE — Assessment & Plan Note (Signed)
 Had urology f/u 05/09/24 - cystoscopy - no papillary tumor. Patch of erythema. Pathology - negative. Monthly Gem/Doce maintenance and repeat cystoscopy in 3 months. (Monthly treatments for two years). Receiving treatment (Docetaxel, gemicitabine). for bladder cancer. Continue f/u with urology.

## 2024-09-14 ENCOUNTER — Other Ambulatory Visit: Payer: Self-pay | Admitting: Internal Medicine

## 2024-09-14 DIAGNOSIS — E538 Deficiency of other specified B group vitamins: Secondary | ICD-10-CM

## 2024-09-22 ENCOUNTER — Ambulatory Visit: Admitting: Internal Medicine

## 2024-11-13 ENCOUNTER — Ambulatory Visit: Admitting: Internal Medicine

## 2025-08-13 ENCOUNTER — Ambulatory Visit
